# Patient Record
Sex: Male | Born: 1955 | Hispanic: No | Marital: Married | Smoking: Former smoker
Health system: Southern US, Community
[De-identification: ages and names within clinical notes are randomized; demographics above are authoritative.]

## PROBLEM LIST (undated history)

## (undated) DIAGNOSIS — M47812 Spondylosis without myelopathy or radiculopathy, cervical region: Secondary | ICD-10-CM

## (undated) DIAGNOSIS — M479 Spondylosis, unspecified: Secondary | ICD-10-CM

## (undated) DIAGNOSIS — A048 Other specified bacterial intestinal infections: Secondary | ICD-10-CM

## (undated) DIAGNOSIS — F32A Depression, unspecified: Secondary | ICD-10-CM

## (undated) DIAGNOSIS — F329 Major depressive disorder, single episode, unspecified: Secondary | ICD-10-CM

## (undated) DIAGNOSIS — N401 Enlarged prostate with lower urinary tract symptoms: Secondary | ICD-10-CM

## (undated) DIAGNOSIS — H9190 Unspecified hearing loss, unspecified ear: Secondary | ICD-10-CM

## (undated) DIAGNOSIS — G4700 Insomnia, unspecified: Secondary | ICD-10-CM

## (undated) DIAGNOSIS — R0602 Shortness of breath: Secondary | ICD-10-CM

## (undated) DIAGNOSIS — E7800 Pure hypercholesterolemia, unspecified: Principal | ICD-10-CM

## (undated) DIAGNOSIS — K219 Gastro-esophageal reflux disease without esophagitis: Secondary | ICD-10-CM

## (undated) DIAGNOSIS — F411 Generalized anxiety disorder: Secondary | ICD-10-CM

## (undated) HISTORY — DX: Depression, unspecified: F32.A

## (undated) HISTORY — PX: POLYPECTOMY: SHX149

## (undated) HISTORY — DX: Shortness of breath: R06.02

## (undated) HISTORY — DX: Spondylosis without myelopathy or radiculopathy, cervical region: M47.812

## (undated) HISTORY — DX: Generalized anxiety disorder: F41.1

## (undated) HISTORY — DX: Other specified bacterial intestinal infections: A04.8

## (undated) HISTORY — DX: Insomnia, unspecified: G47.00

## (undated) HISTORY — DX: Pure hypercholesterolemia, unspecified: E78.00

## (undated) HISTORY — DX: Gastro-esophageal reflux disease without esophagitis: K21.9

## (undated) HISTORY — DX: Major depressive disorder, single episode, unspecified: F32.9

## (undated) HISTORY — DX: Spondylosis, unspecified: M47.9

## (undated) HISTORY — DX: Unspecified hearing loss, unspecified ear: H91.90

## (undated) HISTORY — DX: Benign prostatic hyperplasia with lower urinary tract symptoms: N40.1

## (undated) HISTORY — PX: COLONOSCOPY: SHX174

---

## 1996-03-11 HISTORY — PX: TYMPANOPLASTY: SHX33

## 1997-07-09 DIAGNOSIS — A048 Other specified bacterial intestinal infections: Secondary | ICD-10-CM

## 1997-07-09 HISTORY — DX: Other specified bacterial intestinal infections: A04.8

## 1997-08-07 ENCOUNTER — Other Ambulatory Visit: Admission: RE | Admit: 1997-08-07 | Discharge: 1997-08-07 | Payer: Self-pay | Admitting: Urology

## 2000-03-17 ENCOUNTER — Encounter: Payer: Self-pay | Admitting: Endocrinology

## 2000-03-17 ENCOUNTER — Ambulatory Visit (HOSPITAL_COMMUNITY): Admission: RE | Admit: 2000-03-17 | Discharge: 2000-03-17 | Payer: Self-pay | Admitting: Endocrinology

## 2000-05-06 ENCOUNTER — Encounter
Admission: RE | Admit: 2000-05-06 | Discharge: 2000-06-01 | Payer: Self-pay | Admitting: Physical Medicine and Rehabilitation

## 2001-07-26 ENCOUNTER — Encounter: Admission: RE | Admit: 2001-07-26 | Discharge: 2001-07-26 | Payer: Self-pay | Admitting: Family Medicine

## 2001-07-26 ENCOUNTER — Encounter: Payer: Self-pay | Admitting: Family Medicine

## 2001-08-04 ENCOUNTER — Encounter: Payer: Self-pay | Admitting: Family Medicine

## 2001-08-04 ENCOUNTER — Encounter: Admission: RE | Admit: 2001-08-04 | Discharge: 2001-08-04 | Payer: Self-pay | Admitting: Family Medicine

## 2003-08-22 HISTORY — PX: OTHER SURGICAL HISTORY: SHX169

## 2004-10-23 ENCOUNTER — Ambulatory Visit: Payer: Self-pay | Admitting: Endocrinology

## 2004-11-14 ENCOUNTER — Ambulatory Visit: Payer: Self-pay | Admitting: Endocrinology

## 2005-06-17 ENCOUNTER — Ambulatory Visit: Payer: Self-pay | Admitting: Endocrinology

## 2006-05-21 ENCOUNTER — Ambulatory Visit: Payer: Self-pay | Admitting: Endocrinology

## 2006-05-21 LAB — CONVERTED CEMR LAB
ALT: 45 units/L — ABNORMAL HIGH (ref 0–40)
AST: 29 units/L (ref 0–37)
Albumin: 4.2 g/dL (ref 3.5–5.2)
Alkaline Phosphatase: 54 units/L (ref 39–117)
BUN: 8 mg/dL (ref 6–23)
Basophils Absolute: 0 10*3/uL (ref 0.0–0.1)
Basophils Relative: 0.4 % (ref 0.0–1.0)
Bilirubin Urine: NEGATIVE
CO2: 30 meq/L (ref 19–32)
Calcium: 9.6 mg/dL (ref 8.4–10.5)
Chloride: 108 meq/L (ref 96–112)
Chol/HDL Ratio, serum: 3
Cholesterol: 125 mg/dL (ref 0–200)
Creatinine, Ser: 0.8 mg/dL (ref 0.4–1.5)
Eosinophil percent: 4.7 % (ref 0.0–5.0)
GFR calc non Af Amer: 109 mL/min
Glomerular Filtration Rate, Af Am: 132 mL/min/{1.73_m2}
Glucose, Bld: 108 mg/dL — ABNORMAL HIGH (ref 70–99)
HCT: 46.5 % (ref 39.0–52.0)
HDL: 41.4 mg/dL (ref >39.0)
Hemoglobin, Urine: NEGATIVE
Hemoglobin: 15.4 g/dL (ref 13.0–17.0)
Ketones, ur: NEGATIVE mg/dL
LDL Cholesterol: 52 mg/dL (ref 0–99)
Leukocytes, UA: NEGATIVE
Lymphocytes Relative: 36.2 % (ref 12.0–46.0)
MCHC: 33.2 g/dL (ref 30.0–36.0)
MCV: 89.7 fL (ref 78.0–100.0)
Monocytes Absolute: 0.5 10*3/uL (ref 0.2–0.7)
Monocytes Relative: 7.9 % (ref 3.0–11.0)
Neutro Abs: 3.6 10*3/uL (ref 1.4–7.7)
Neutrophils Relative %: 50.8 % (ref 43.0–77.0)
Nitrite: NEGATIVE
PSA: 1.9 ng/mL (ref 0.10–4.00)
Platelets: 221 10*3/uL (ref 150–400)
Potassium: 4 meq/L (ref 3.5–5.1)
RBC: 5.19 M/uL (ref 4.22–5.81)
RDW: 11.2 % — ABNORMAL LOW (ref 11.5–14.6)
Sodium: 143 meq/L (ref 135–145)
Specific Gravity, Urine: 1.025 (ref 1.000–1.03)
TSH: 0.53 microintl units/mL (ref 0.35–5.50)
Total Bilirubin: 1.2 mg/dL (ref 0.3–1.2)
Total Protein, Urine: NEGATIVE mg/dL
Total Protein: 6.9 g/dL (ref 6.0–8.3)
Triglyceride fasting, serum: 160 mg/dL — ABNORMAL HIGH (ref 0–149)
Urine Glucose: NEGATIVE mg/dL
Urobilinogen, UA: 0.2 (ref 0.0–1.0)
VLDL: 32 mg/dL (ref 0–40)
WBC: 6.9 10*3/uL (ref 4.5–10.5)
pH: 6 (ref 5.0–8.0)

## 2006-05-28 ENCOUNTER — Ambulatory Visit: Payer: Self-pay | Admitting: Endocrinology

## 2006-05-28 HISTORY — PX: ELECTROCARDIOGRAM: SHX264

## 2006-05-28 LAB — CONVERTED CEMR LAB
ALT: 42 units/L — ABNORMAL HIGH (ref 0–40)
AST: 28 units/L (ref 0–37)
Albumin: 4.4 g/dL (ref 3.5–5.2)
Alkaline Phosphatase: 59 units/L (ref 39–117)
BUN: 9 mg/dL (ref 6–23)
Basophils Absolute: 0.1 10*3/uL (ref 0.0–0.1)
Basophils Relative: 0.9 % (ref 0.0–1.0)
Bilirubin Urine: NEGATIVE
CO2: 28 meq/L (ref 19–32)
Calcium: 9.6 mg/dL (ref 8.4–10.5)
Chloride: 107 meq/L (ref 96–112)
Cholesterol: 132 mg/dL (ref 0–200)
Creatinine, Ser: 0.9 mg/dL (ref 0.4–1.5)
Eosinophils Relative: 4.1 % (ref 0.0–5.0)
GFR calc Af Amer: 115 mL/min
GFR calc non Af Amer: 95 mL/min
Glucose, Bld: 116 mg/dL — ABNORMAL HIGH (ref 70–99)
HCT: 47.2 % (ref 39.0–52.0)
HDL: 45.8 mg/dL (ref >39.0)
Hemoglobin, Urine: NEGATIVE
Hemoglobin: 15.5 g/dL (ref 13.0–17.0)
Hep B S Ab: NEGATIVE
Hepatitis B Surface Ag: NEGATIVE
Ketones, ur: NEGATIVE mg/dL
LDL Cholesterol: 63 mg/dL (ref 0–99)
Leukocytes, UA: NEGATIVE
Lymphocytes Relative: 38.1 % (ref 12.0–46.0)
MCHC: 32.9 g/dL (ref 30.0–36.0)
MCV: 89.5 fL (ref 78.0–100.0)
Monocytes Absolute: 0.6 10*3/uL (ref 0.2–0.7)
Monocytes Relative: 8.2 % (ref 3.0–11.0)
Neutro Abs: 3.8 10*3/uL (ref 1.4–7.7)
Neutrophils Relative %: 48.7 % (ref 43.0–77.0)
Nitrite: NEGATIVE
Platelets: 213 10*3/uL (ref 150–400)
Potassium: 4.2 meq/L (ref 3.5–5.1)
RBC: 5.27 M/uL (ref 4.22–5.81)
RDW: 11.6 % (ref 11.5–14.6)
Sodium: 141 meq/L (ref 135–145)
Specific Gravity, Urine: 1.025 (ref 1.000–1.03)
TSH: 0.64 microintl units/mL (ref 0.35–5.50)
Total Bilirubin: 1.2 mg/dL (ref 0.3–1.2)
Total CHOL/HDL Ratio: 2.9
Total Protein, Urine: NEGATIVE mg/dL
Total Protein: 7.2 g/dL (ref 6.0–8.3)
Triglycerides: 118 mg/dL (ref 0–149)
Urine Glucose: NEGATIVE mg/dL
Urobilinogen, UA: 0.2 (ref 0.0–1.0)
VLDL: 24 mg/dL (ref 0–40)
WBC: 7.7 10*3/uL (ref 4.5–10.5)
pH: 6 (ref 5.0–8.0)

## 2007-01-03 ENCOUNTER — Encounter: Payer: Self-pay | Admitting: Endocrinology

## 2007-01-03 DIAGNOSIS — F411 Generalized anxiety disorder: Secondary | ICD-10-CM | POA: Insufficient documentation

## 2007-01-03 HISTORY — DX: Generalized anxiety disorder: F41.1

## 2007-01-12 ENCOUNTER — Ambulatory Visit: Payer: Self-pay | Admitting: Internal Medicine

## 2007-08-16 ENCOUNTER — Telehealth (INDEPENDENT_AMBULATORY_CARE_PROVIDER_SITE_OTHER): Payer: Self-pay | Admitting: *Deleted

## 2007-08-29 ENCOUNTER — Ambulatory Visit: Payer: Self-pay | Admitting: Endocrinology

## 2007-08-29 LAB — CONVERTED CEMR LAB
ALT: 41 units/L (ref 0–53)
AST: 28 units/L (ref 0–37)
Albumin: 4.3 g/dL (ref 3.5–5.2)
Alkaline Phosphatase: 53 units/L (ref 39–117)
BUN: 11 mg/dL (ref 6–23)
Basophils Absolute: 0 10*3/uL (ref 0.0–0.1)
Basophils Relative: 0.1 % (ref 0.0–1.0)
Bilirubin Urine: NEGATIVE
Bilirubin, Direct: 0.2 mg/dL (ref 0.0–0.3)
CO2: 28 meq/L (ref 19–32)
Calcium: 9.3 mg/dL (ref 8.4–10.5)
Chloride: 110 meq/L (ref 96–112)
Cholesterol: 121 mg/dL (ref 0–200)
Creatinine, Ser: 0.8 mg/dL (ref 0.4–1.5)
Eosinophils Absolute: 0.2 10*3/uL (ref 0.0–0.7)
Eosinophils Relative: 2.5 % (ref 0.0–5.0)
GFR calc Af Amer: 132 mL/min
GFR calc non Af Amer: 109 mL/min
Glucose, Bld: 106 mg/dL — ABNORMAL HIGH (ref 70–99)
HCT: 43.7 % (ref 39.0–52.0)
HDL: 39.9 mg/dL (ref >39.0)
Hemoglobin, Urine: NEGATIVE
Hemoglobin: 15 g/dL (ref 13.0–17.0)
Ketones, ur: NEGATIVE mg/dL
LDL Cholesterol: 60 mg/dL (ref 0–99)
Leukocytes, UA: NEGATIVE
Lymphocytes Relative: 29.1 % (ref 12.0–46.0)
MCHC: 34.3 g/dL (ref 30.0–36.0)
MCV: 88.1 fL (ref 78.0–100.0)
Monocytes Absolute: 0.5 10*3/uL (ref 0.1–1.0)
Monocytes Relative: 6.3 % (ref 3.0–12.0)
Neutro Abs: 4.5 10*3/uL (ref 1.4–7.7)
Neutrophils Relative %: 62 % (ref 43.0–77.0)
Nitrite: NEGATIVE
Platelets: 220 10*3/uL (ref 150–400)
Potassium: 4.2 meq/L (ref 3.5–5.1)
RBC: 4.96 M/uL (ref 4.22–5.81)
RDW: 11.3 % — ABNORMAL LOW (ref 11.5–14.6)
Sodium: 142 meq/L (ref 135–145)
Specific Gravity, Urine: 1.015 (ref 1.000–1.03)
TSH: 0.86 microintl units/mL (ref 0.35–5.50)
Total Bilirubin: 1.3 mg/dL — ABNORMAL HIGH (ref 0.3–1.2)
Total CHOL/HDL Ratio: 3
Total Protein, Urine: NEGATIVE mg/dL
Total Protein: 7 g/dL (ref 6.0–8.3)
Triglycerides: 107 mg/dL (ref 0–149)
Urine Glucose: NEGATIVE mg/dL
Urobilinogen, UA: 0.2 (ref 0.0–1.0)
VLDL: 21 mg/dL (ref 0–40)
WBC: 7.4 10*3/uL (ref 4.5–10.5)
pH: 6 (ref 5.0–8.0)

## 2007-09-01 ENCOUNTER — Ambulatory Visit: Payer: Self-pay | Admitting: Endocrinology

## 2007-09-01 DIAGNOSIS — E7800 Pure hypercholesterolemia, unspecified: Secondary | ICD-10-CM

## 2007-09-01 DIAGNOSIS — R739 Hyperglycemia, unspecified: Secondary | ICD-10-CM

## 2007-09-01 DIAGNOSIS — R0602 Shortness of breath: Secondary | ICD-10-CM

## 2007-09-01 HISTORY — DX: Pure hypercholesterolemia, unspecified: E78.00

## 2007-09-01 HISTORY — DX: Shortness of breath: R06.02

## 2007-09-01 HISTORY — DX: Hyperglycemia, unspecified: R73.9

## 2007-09-01 IMAGING — CR DG CHEST 2V
3 series · 3 of 3 positions shown · non-contrast
Comparison: [DATE]

CLINICAL DATA: Dyspnea

CHEST - 2 VIEW

[view not recorded (1 of 3)]
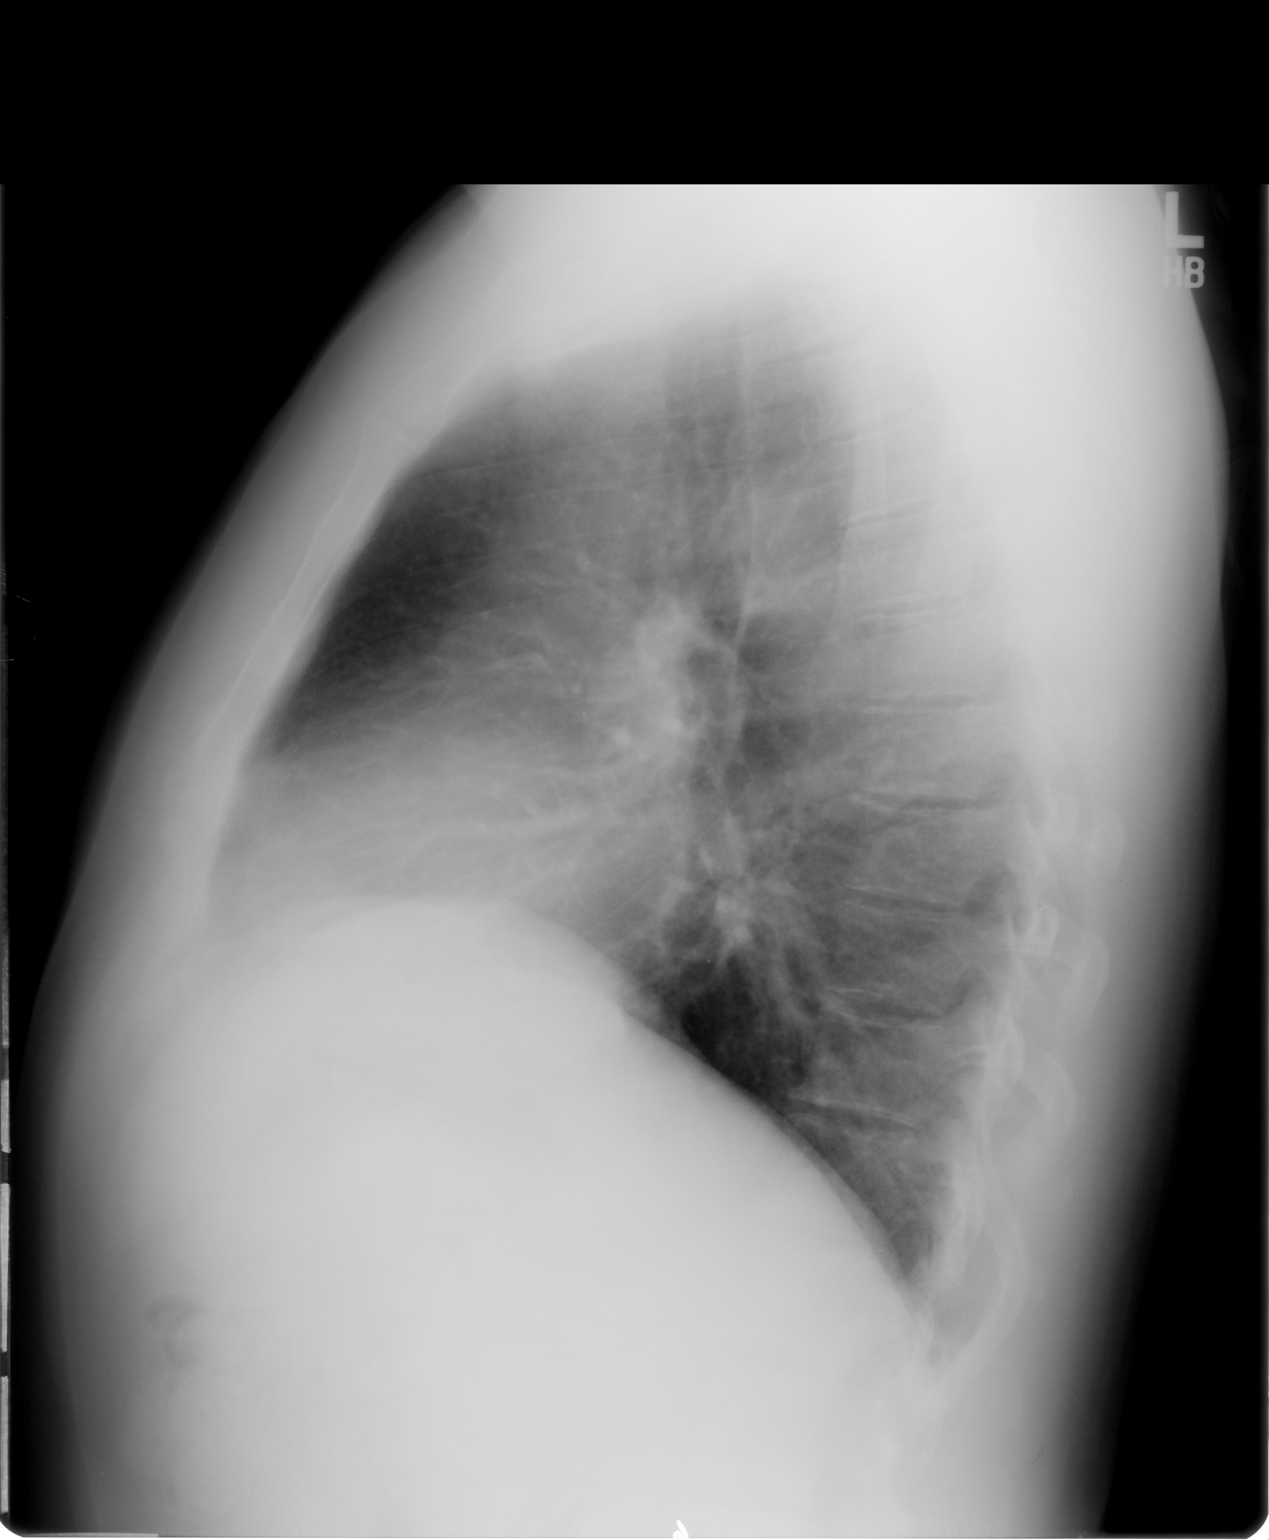

[view not recorded (2 of 3)]
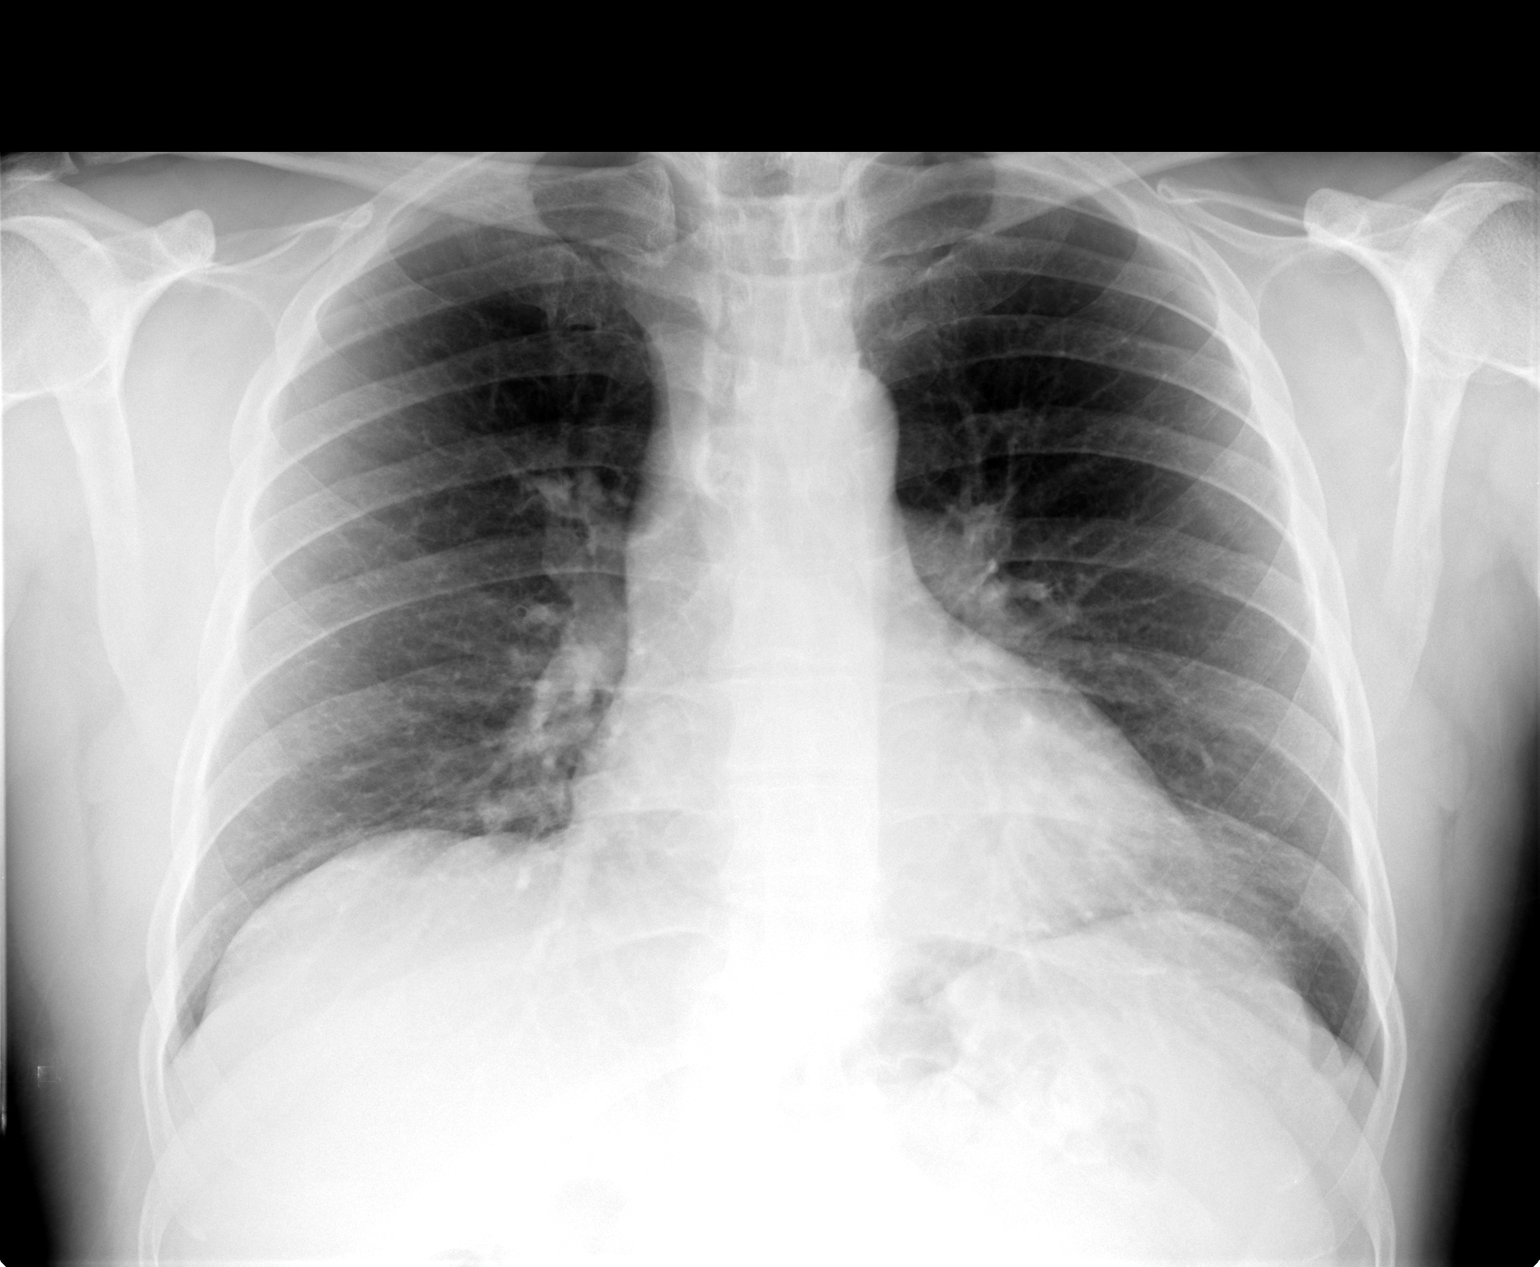

[view not recorded (3 of 3)]
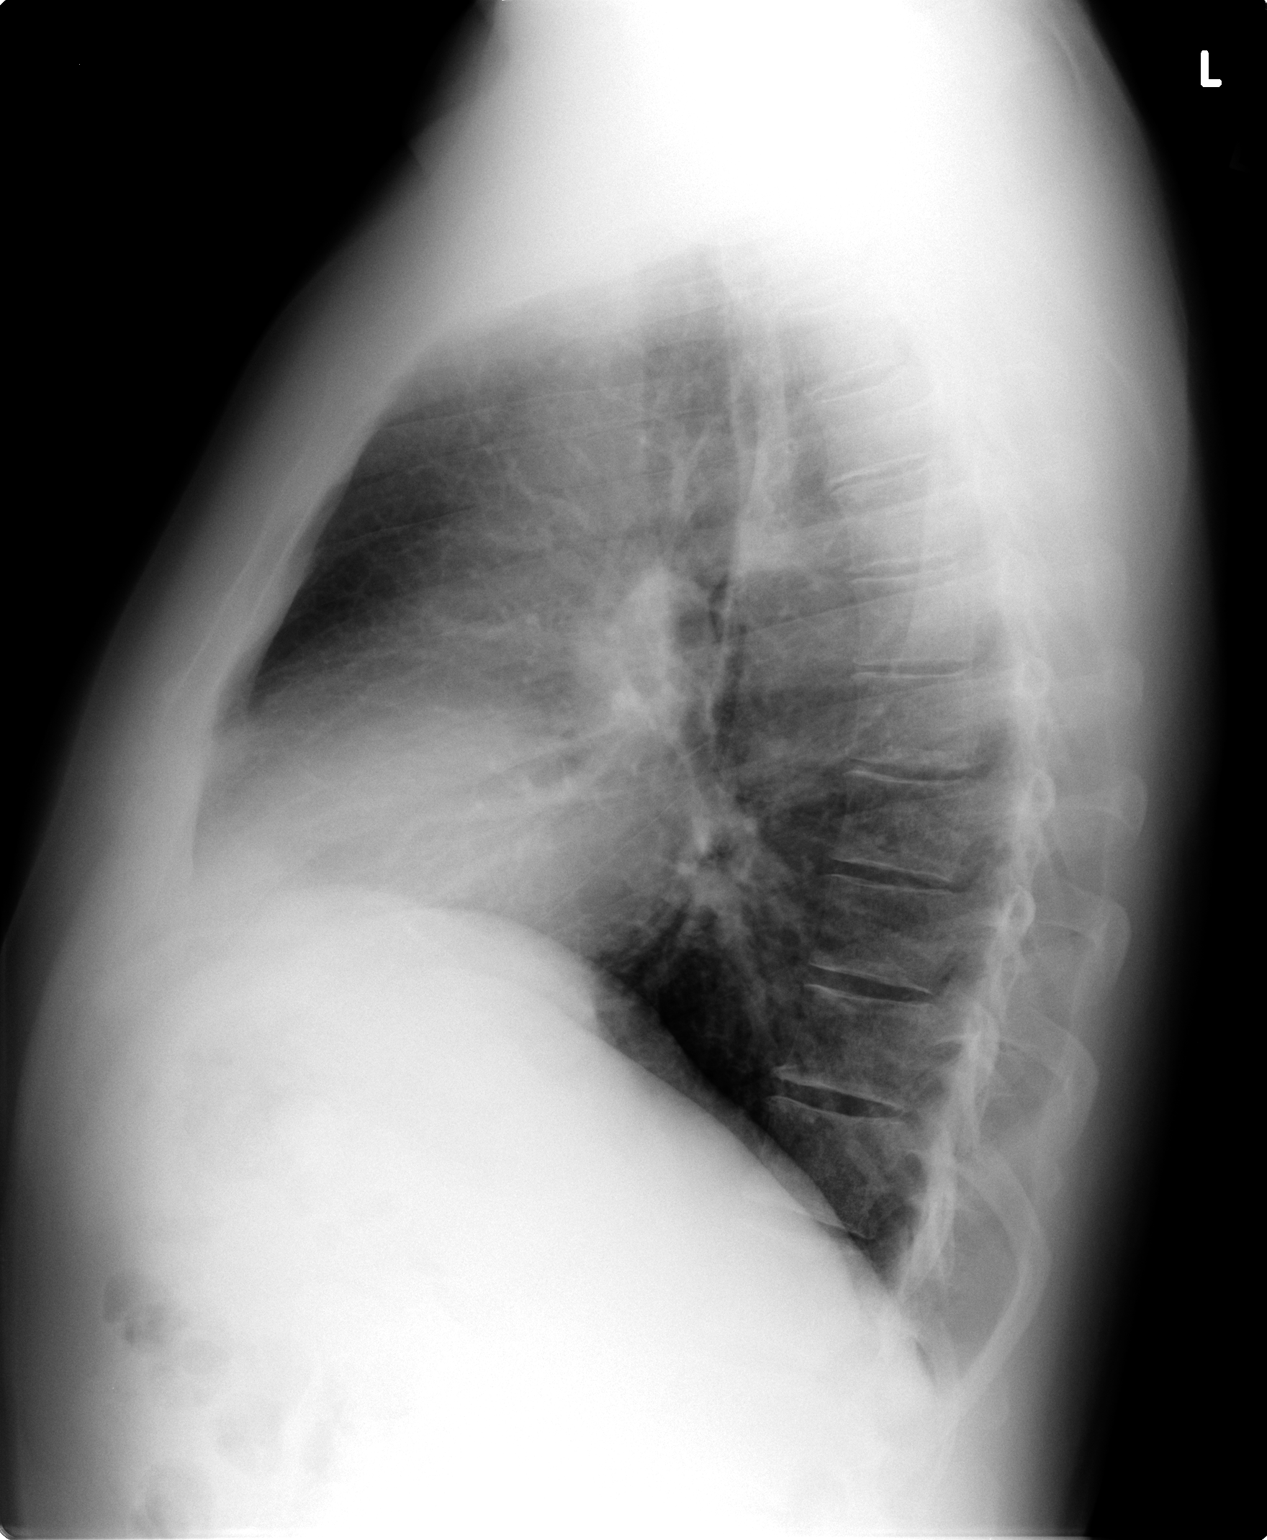

[3 of 3 positions shown; findings below may reference images not displayed]

FINDINGS: The heart is normal in size allowing for low lung
volumes.  Mild bronchitic changes are present.  The lungs are under
aerated and clear.  No pneumothoraces or effusions are seen.
IMPRESSION: Stable bronchitic changes.

## 2008-06-19 ENCOUNTER — Ambulatory Visit: Payer: Self-pay | Admitting: Endocrinology

## 2008-06-19 DIAGNOSIS — K219 Gastro-esophageal reflux disease without esophagitis: Secondary | ICD-10-CM

## 2008-06-19 DIAGNOSIS — G4700 Insomnia, unspecified: Secondary | ICD-10-CM

## 2008-06-19 DIAGNOSIS — N401 Enlarged prostate with lower urinary tract symptoms: Secondary | ICD-10-CM

## 2008-06-19 DIAGNOSIS — M479 Spondylosis, unspecified: Secondary | ICD-10-CM

## 2008-06-19 DIAGNOSIS — H9190 Unspecified hearing loss, unspecified ear: Secondary | ICD-10-CM

## 2008-06-19 HISTORY — DX: Spondylosis, unspecified: M47.9

## 2008-06-19 HISTORY — DX: Gastro-esophageal reflux disease without esophagitis: K21.9

## 2008-06-19 HISTORY — DX: Benign prostatic hyperplasia with lower urinary tract symptoms: N40.1

## 2008-06-19 HISTORY — DX: Unspecified hearing loss, unspecified ear: H91.90

## 2008-06-19 HISTORY — DX: Insomnia, unspecified: G47.00

## 2008-06-25 ENCOUNTER — Encounter: Payer: Self-pay | Admitting: Internal Medicine

## 2008-06-25 ENCOUNTER — Encounter: Payer: Self-pay | Admitting: Endocrinology

## 2008-06-25 ENCOUNTER — Ambulatory Visit: Payer: Self-pay | Admitting: Internal Medicine

## 2008-09-21 ENCOUNTER — Telehealth: Payer: Self-pay | Admitting: Endocrinology

## 2009-02-22 ENCOUNTER — Ambulatory Visit: Payer: Self-pay | Admitting: Gastroenterology

## 2009-02-25 ENCOUNTER — Ambulatory Visit: Payer: Self-pay | Admitting: Endocrinology

## 2009-02-25 LAB — CONVERTED CEMR LAB
Alkaline Phosphatase: 56 units/L (ref 39–117)
Basophils Absolute: 0 10*3/uL (ref 0.0–0.1)
Bilirubin Urine: NEGATIVE
Bilirubin, Direct: 0.1 mg/dL (ref 0.0–0.3)
Cholesterol: 129 mg/dL (ref 0–200)
Eosinophils Absolute: 0.2 10*3/uL (ref 0.0–0.7)
GFR calc non Af Amer: 107.83 mL/min (ref >60)
HCT: 45.6 % (ref 39.0–52.0)
HDL: 39.8 mg/dL (ref >39.00)
Hemoglobin: 15.4 g/dL (ref 13.0–17.0)
Ketones, ur: NEGATIVE mg/dL
LDL Cholesterol: 64 mg/dL (ref 0–99)
Leukocytes, UA: NEGATIVE
Lymphocytes Relative: 33 % (ref 12.0–46.0)
Monocytes Absolute: 0.5 10*3/uL (ref 0.1–1.0)
Neutrophils Relative %: 56.4 % (ref 43.0–77.0)
Platelets: 201 10*3/uL (ref 150.0–400.0)
Potassium: 4.3 meq/L (ref 3.5–5.1)
RDW: 11.5 % (ref 11.5–14.6)
Sodium: 142 meq/L (ref 135–145)
Specific Gravity, Urine: 1.015 (ref 1.000–1.030)
Total Bilirubin: 1 mg/dL (ref 0.3–1.2)
Total CHOL/HDL Ratio: 3
Total Protein: 7.8 g/dL (ref 6.0–8.3)
Triglycerides: 124 mg/dL (ref 0.0–149.0)
Urine Glucose: NEGATIVE mg/dL
Urobilinogen, UA: 0.2 (ref 0.0–1.0)
WBC: 6.5 10*3/uL (ref 4.5–10.5)

## 2009-02-27 ENCOUNTER — Ambulatory Visit: Payer: Self-pay | Admitting: Endocrinology

## 2009-03-15 ENCOUNTER — Encounter: Payer: Self-pay | Admitting: Endocrinology

## 2009-03-15 ENCOUNTER — Telehealth: Payer: Self-pay | Admitting: Endocrinology

## 2009-03-15 ENCOUNTER — Ambulatory Visit: Payer: Self-pay | Admitting: Gastroenterology

## 2009-10-02 ENCOUNTER — Telehealth: Payer: Self-pay | Admitting: Endocrinology

## 2009-10-09 ENCOUNTER — Telehealth: Payer: Self-pay | Admitting: Internal Medicine

## 2009-10-09 ENCOUNTER — Encounter: Payer: Self-pay | Admitting: Endocrinology

## 2009-10-30 ENCOUNTER — Ambulatory Visit: Payer: Self-pay | Admitting: Endocrinology

## 2009-10-30 DIAGNOSIS — M2550 Pain in unspecified joint: Secondary | ICD-10-CM

## 2009-10-30 DIAGNOSIS — M79609 Pain in unspecified limb: Secondary | ICD-10-CM

## 2009-10-30 HISTORY — DX: Pain in unspecified limb: M79.609

## 2009-10-30 HISTORY — DX: Pain in unspecified joint: M25.50

## 2009-10-30 LAB — CONVERTED CEMR LAB: Sed Rate: 13 mm/hr (ref 0–22)

## 2009-11-01 LAB — CONVERTED CEMR LAB
ANA Titer 1: NEGATIVE
Rhuematoid fact SerPl-aCnc: <20 intl units/mL (ref 0–20)

## 2009-11-06 ENCOUNTER — Encounter: Payer: Self-pay | Admitting: Endocrinology

## 2010-05-28 ENCOUNTER — Telehealth: Payer: Self-pay | Admitting: Endocrinology

## 2010-06-10 NOTE — Medication Information (Signed)
Summary: Zolpidem/[facility]  Zolpidem/[facility]   Imported By: Phillis Knack 10/14/2009 11:02:55  _____________________________________________________________________  External Attachment:    Type:   Image     Comment:   External Document

## 2010-06-10 NOTE — Progress Notes (Signed)
Summary: PA--Zolpidem  Phone Note From Pharmacy   Summary of Call: PA request--Zolpidem. Was available online, completed, and approved until 2012. Initial call taken by: Ernestene Mention,  October 09, 2009 8:46 AM  Follow-up for Phone Call        noted Follow-up by: Biagio Borg MD,  October 10, 2009 6:37 PM

## 2010-06-10 NOTE — Medication Information (Signed)
Summary: Zolpidem Approved/[facility]  Zolpidem Approved/[facility]   Imported By: Phillis Knack 10/31/2009 15:09:04  _____________________________________________________________________  External Attachment:    Type:   Image     Comment:   External Document

## 2010-06-10 NOTE — Assessment & Plan Note (Signed)
Summary: left ankle pain/cd   Vital Signs:  Patient profile:   55 year old male Height:      70 inches (177.80 cm) Weight:      198 pounds (90.00 kg) BMI:     28.51 O2 Sat:      99 % on Room air Temp:     98.1 degrees F (36.72 degrees C) oral Pulse rate:   96 / minute Pulse rhythm:   regular BP sitting:   104 / 74  (left arm) Cuff size:   regular  Vitals Entered By: Rebeca Alert MA (October 30, 2009 1:49 PM)  O2 Flow:  Room air CC: pt c/o left ankle pain with swelling/pt states pain is worse at night/aj   CC:  pt c/o left ankle pain with swelling/pt states pain is worse at night/aj.  History of Present Illness: pt states few weeks of intermittent moderate pain at the left foot and ankle.  no assoc numbness.  no injury.    Current Medications (verified): 1)  Lipitor 80 Mg  Tabs (Atorvastatin Calcium) .... Take 1 By Mouth Qd 2)  Flomax 0.4 Mg  Cp24 (Tamsulosin Hcl) .... Take 1 By Mouth Bid 3)  Zoloft 100 Mg  Tabs (Sertraline Hcl) .... Take 1 By Mouth Qd 4)  Avodart 0.5 Mg  Caps (Dutasteride) .... Take 1 By Mouth Qd 5)  Prilosec Otc 20 Mg Tbec (Omeprazole Magnesium) .... Qd 6)  Alprazolam 0.25 Mg Tabs (Alprazolam) .Marland Kitchen.. 1 Three Times A Day Prn Anxiety 7)  Ambien Cr 12.5 Mg Cr-Tabs (Zolpidem Tartrate) .Marland Kitchen.. 1 At Bedtime As Needed For Sleep  Allergies (verified): 1)  ! Zocor  Past History:  Past Medical History: Last updated: 09/01/2007 Smoker (Quit 1985) Hearing Loss Dyslipidemia Positive H. 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 he also has arthralgias at the wrists and ankles.  Physical Exam  General:  normal appearance.   Pulses:  dorsalis pedis intact bilat.    Extremities:  left foot and ankle are normal except for slight swelling and ankle tenderness.   Neurologic:  sensation is intact to touch on the feet  Additional Exam:   Sed  Rate                  13 mm/hr    Impression & Recommendations:  Problem # 1:  FOOT PAIN (ZHY-865.7) new uncertain etiology  Problem # 2:  ANKLE PAIN (QIO-962.95) new uncertain etiology  Other Orders: T-Antinuclear Antib (ANA) ([id number]) T- * Misc. Laboratory test [phone]) TLB-Sedimentation Rate (ESR) (85652-ESR) T-Ankle Comp Left Min 3 Views (73610TC) T-Foot Left Min 3 Views ([phone]) Orthopedic Referral (Ortho) Est. Patient Level IV (47425)  Patient Instructions: 1)  blood tests and x rays are being ordered for you today.  please call [phone] to hear your test results. 2)  refer dr Nelva Bush.  you will be called with a day and time for an appointment 3)  here are some samples of limovo 500/20, 1 pill two times a day as needed for pain. 4)  (update: i left message on phone-tree:  rx as we discussed)

## 2010-06-10 NOTE — Letter (Signed)
Summary: [place]   Imported By: Bubba Hales 11/18/2009 09:15:11  _____________________________________________________________________  External Attachment:    Type:   Image     Comment:   External Document

## 2010-06-10 NOTE — Progress Notes (Signed)
Summary: Rx refill Req  Phone Note Call from Patient   Caller: Patient [phone] Summary of Call: pt called requesting Rx for Ambien generic be changed to Ambien CR generic. please advise Initial call taken by: Crissie Sickles, [place],  Oct 02, 2009 11:50 AM  Follow-up for Phone Call        i printed Follow-up by: Donavan Foil MD,  Oct 02, 2009 1:16 PM  Additional Follow-up for Phone Call Additional follow up Details #1::        Rx telephoned to pharmacy. Pt informed Additional Follow-up by: Crissie Sickles, [place],  Oct 02, 2009 2:39 PM    New/Updated Medications: AMBIEN CR 12.5 MG CR-TABS (ZOLPIDEM TARTRATE) 1 at bedtime as needed for sleep Prescriptions: AMBIEN CR 12.5 MG CR-TABS (ZOLPIDEM TARTRATE) 1 at bedtime as needed for sleep  #30 x 5   Entered and Authorized by:   Donavan Foil MD   Signed by:   Donavan Foil MD on 10/02/2009   Method used:   Print then Give to Patient   RxID:   [id number]

## 2010-06-12 NOTE — Progress Notes (Signed)
Summary: CPX due  Phone Note Outgoing Call Call back at [facility] Phone [phone]   Call placed by: Rebeca Alert CMA Deborra Medina),  May 28, 2010 11:58 AM Call placed to: Patient Details for Reason: CPX due Summary of Call: Per MD, pt is due for CPX. Left message for pt to callback office  Follow-up for Phone Call        pt unavailable at work #, unable to leave message Follow-up by: Rebeca Alert CMA Deborra Medina),  May 30, 2010 4:38 PM  Additional Follow-up for Phone Call Additional follow up Details #1::        left message for pt to callback office Additional Follow-up by: Rebeca Alert CMA Deborra Medina),  June 02, 2010 4:18 PM    Additional Follow-up for Phone Call Additional follow up Details #2::    pt informed, pt states he will callback to schedule appointment for CPX Follow-up by: Rebeca Alert CMA Deborra Medina),  June 04, 2010 11:48 AM

## 2010-06-19 ENCOUNTER — Other Ambulatory Visit: Payer: Self-pay | Admitting: Endocrinology

## 2010-06-19 ENCOUNTER — Other Ambulatory Visit: Payer: BC Managed Care – PPO

## 2010-06-19 ENCOUNTER — Encounter (INDEPENDENT_AMBULATORY_CARE_PROVIDER_SITE_OTHER): Payer: Self-pay | Admitting: *Deleted

## 2010-06-19 DIAGNOSIS — Z0000 Encounter for general adult medical examination without abnormal findings: Secondary | ICD-10-CM

## 2010-06-19 DIAGNOSIS — Z0389 Encounter for observation for other suspected diseases and conditions ruled out: Secondary | ICD-10-CM

## 2010-06-19 LAB — BASIC METABOLIC PANEL
BUN: 13 mg/dL (ref 6–23)
Chloride: 101 mEq/L (ref 96–112)
Glucose, Bld: 97 mg/dL (ref 70–99)
Potassium: 4.4 mEq/L (ref 3.5–5.1)

## 2010-06-19 LAB — URINALYSIS, ROUTINE W REFLEX MICROSCOPIC
Ketones, ur: NEGATIVE
Leukocytes, UA: NEGATIVE
Specific Gravity, Urine: 1.015 (ref 1.000–1.030)
Urobilinogen, UA: 0.2 (ref 0.0–1.0)

## 2010-06-19 LAB — HEPATIC FUNCTION PANEL
ALT: 38 U/L (ref 0–53)
Total Bilirubin: 1.1 mg/dL (ref 0.3–1.2)

## 2010-06-19 LAB — CBC WITH DIFFERENTIAL/PLATELET
Basophils Absolute: 0 10*3/uL (ref 0.0–0.1)
Eosinophils Absolute: 0.2 10*3/uL (ref 0.0–0.7)
HCT: 44 % (ref 39.0–52.0)
Lymphs Abs: 2.9 10*3/uL (ref 0.7–4.0)
MCHC: 34 g/dL (ref 30.0–36.0)
MCV: 89.1 fl (ref 78.0–100.0)
Monocytes Absolute: 0.4 10*3/uL (ref 0.1–1.0)
Platelets: 197 10*3/uL (ref 150.0–400.0)
RDW: 12.2 % (ref 11.5–14.6)

## 2010-06-19 LAB — LIPID PANEL
Cholesterol: 133 mg/dL (ref 0–200)
VLDL: 22.2 mg/dL (ref 0.0–40.0)

## 2010-06-19 LAB — TSH: TSH: 0.58 u[IU]/mL (ref 0.35–5.50)

## 2010-06-19 LAB — PSA: PSA: 0.41 ng/mL (ref 0.10–4.00)

## 2010-06-24 ENCOUNTER — Encounter: Payer: Self-pay | Admitting: Endocrinology

## 2010-06-24 ENCOUNTER — Encounter (INDEPENDENT_AMBULATORY_CARE_PROVIDER_SITE_OTHER): Payer: BC Managed Care – PPO | Admitting: Endocrinology

## 2010-06-24 DIAGNOSIS — Z0000 Encounter for general adult medical examination without abnormal findings: Secondary | ICD-10-CM

## 2010-07-02 NOTE — Assessment & Plan Note (Signed)
Summary: PER PT PER MD/[facility]   Vital Signs:  Patient profile:   55 year old male Height:      70 inches (177.80 cm) Weight:      184.50 pounds (83.86 kg) BMI:     26.57 O2 Sat:      97 % on Room air Temp:     98.7 degrees F (37.06 degrees C) oral Pulse rate:   73 / minute Pulse rhythm:   regular BP sitting:   108 / 72  (left arm) Cuff size:   regular  Vitals Entered By: Rebeca Alert CMA Deborra Medina) (June 24, 2010 2:14 PM)  O2 Flow:  Room air CC: CPX/aj Is Patient Diabetic? No   CC:  CPX/aj.  History of Present Illness: here for regular wellness examination.  He's feeling pretty well in general, and does not drink or smoke.  he has lost weight, due to his efforts.  Current Medications (verified): 1)  Lipitor 80 Mg  Tabs (Atorvastatin Calcium) .... Take 1 By Mouth Qd 2)  Flomax 0.4 Mg Caps (Tamsulosin Hcl) .... 2 Capsules By Mouth Once Daily 3)  Zoloft 100 Mg  Tabs (Sertraline Hcl) .... Take 1 By Mouth Qd 4)  Avodart 0.5 Mg  Caps (Dutasteride) .... Take 1 By Mouth Qd 5)  Prilosec Otc 20 Mg Tbec (Omeprazole Magnesium) .... Qd 6)  Alprazolam 0.25 Mg Tabs (Alprazolam) .Marland Kitchen.. 1 Three Times A Day Prn Anxiety 7)  Ambien Cr 12.5 Mg Cr-Tabs (Zolpidem Tartrate) .Marland Kitchen.. 1 At Bedtime As Needed For Sleep  Allergies (verified): 1)  ! Zocor  Past History:  Past Medical History: Last updated: 09/01/2007 Smoker (Quit July 27, 1983) Hearing Loss Dyslipidemia Positive H. 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 father deceased July 26, 2009, due to gastric cancer  Social History: Reviewed history from 09/01/2007 and no changes required. married college professor  Review of Systems  The patient denies fever, vision loss, decreased hearing, chest pain, syncope, prolonged cough, headaches, abdominal pain, melena,  hematochezia, severe indigestion/heartburn, hematuria, suspicious skin lesions, and depression.         he has slight doe  Physical Exam  General:  normal appearance.   Head:  head: no deformity eyes: no periorbital swelling, no proptosis external nose and ears are normal mouth: no lesion seen Neck:  Supple without thyroid enlargement or tenderness.  Lungs:  Clear to auscultation bilaterally. Normal respiratory effort.  Heart:  Regular rate and rhythm without murmurs or gallops noted. Normal S1,S2.   Abdomen:  abdomen is soft, nontender.  no hepatosplenomegaly.   not distended.  no hernia  Rectal:  sees urology  Prostate:  sees urology  Msk:  muscle bulk and strength are grossly normal.  no obvious joint swelling.  gait is normal and steady  Pulses:  dorsalis pedis intact bilat.  no carotid bruit  Extremities:  no deformity.  no ulcer on the feet.  feet are of normal color and temp.  no edema  Neurologic:  cn 2-12 grossly intact.   readily moves all 4's.   sensation is intact to touch on the feet Skin:  normal texture and temp.  no rash.  not diaphoretic  Cervical Nodes:  No significant adenopathy.  Psych:  Alert and cooperative; normal mood and affect; normal attention span and concentration.     Impression & Recommendations:  Problem # 1:  ROUTINE GENERAL MEDICAL EXAM@HEALTH   CARE FACL (ICD-V70.0)  Medications Added to Medication List This Visit: 1)  Flomax 0.4 Mg Caps (Tamsulosin hcl) .... 2 capsules by mouth once daily 2)  Alprazolam 0.5 Mg Tabs (Alprazolam) .Marland Kitchen.. 1 tab three times a day as needed for anxiety  Other Orders: Est. Patient 40-64 years [phone])  Patient Instructions: 1)  please consider these measures for your health:  minimize alcohol.  do not use tobacco products.  have a colonoscopy at least every 10 years from age 68.  keep firearms safely stored.  always use seat belts.  have working smoke alarms in your home.  see an eye doctor and dentist regularly.   never drive under the influence of alcohol or drugs (including prescription drugs).  those with fair skin should take precautions against the sun. 2)  please let me know what your wishes would be, if artificial life support measures should become necessary.  it is critically important to prevent falling down (keep floor areas well-lit, dry, and free of loose objects).   3)  Please schedule a follow-up appointment in 1 year. Prescriptions: ALPRAZOLAM 0.5 MG TABS (ALPRAZOLAM) 1 tab three times a day as needed for anxiety  #50 x 5   Entered and Authorized by:   Donavan Foil MD   Signed by:   Donavan Foil MD on 06/24/2010   Method used:   Print then Give to Patient   RxID:   [id number]    Orders Added: 1)  Est. Patient 40-64 years [[id number]]

## 2010-08-09 ENCOUNTER — Other Ambulatory Visit: Payer: Self-pay | Admitting: Endocrinology

## 2010-08-16 ENCOUNTER — Other Ambulatory Visit: Payer: Self-pay | Admitting: Endocrinology

## 2010-08-18 NOTE — Telephone Encounter (Signed)
MD is out of office. Is this ok to refill?

## 2010-08-18 NOTE — Telephone Encounter (Signed)
Faxed script back to [facility] [employer]...08/18/10@4 :23pm/LMB

## 2010-09-16 ENCOUNTER — Other Ambulatory Visit: Payer: Self-pay | Admitting: Internal Medicine

## 2010-09-16 ENCOUNTER — Other Ambulatory Visit: Payer: Self-pay | Admitting: Endocrinology

## 2010-09-17 NOTE — Telephone Encounter (Signed)
i printed 

## 2010-09-17 NOTE — Telephone Encounter (Signed)
Rx faxed to pharmacy  

## 2010-09-23 ENCOUNTER — Other Ambulatory Visit: Payer: Self-pay

## 2010-09-23 MED ORDER — DUTASTERIDE 0.5 MG PO CAPS: 0.5000 mg | ORAL_CAPSULE | Freq: Every day | ORAL | Status: DC

## 2010-09-23 NOTE — Telephone Encounter (Signed)
Pt advise via VM

## 2010-09-23 NOTE — Telephone Encounter (Signed)
Pt called stating that at CPX in Feb 2012 he was given samples of Jalyn to try in replacement of Avodart and Flomax. Pt says that he prefers to take medication separately and is requesting Rx for Avodart, okay to refill?

## 2010-09-23 NOTE — Telephone Encounter (Signed)
That is fine.  Please refill prn

## 2010-10-19 ENCOUNTER — Other Ambulatory Visit: Payer: Self-pay | Admitting: Endocrinology

## 2010-10-20 ENCOUNTER — Other Ambulatory Visit: Payer: Self-pay

## 2010-10-20 NOTE — Telephone Encounter (Signed)
Already done June 11

## 2010-10-20 NOTE — Telephone Encounter (Signed)
SAE pt-out of office-please advise

## 2010-10-20 NOTE — Telephone Encounter (Signed)
Rx faxed to [facility].

## 2010-12-10 ENCOUNTER — Other Ambulatory Visit: Payer: Self-pay | Admitting: Endocrinology

## 2011-02-27 ENCOUNTER — Other Ambulatory Visit: Payer: Self-pay | Admitting: Internal Medicine

## 2011-03-02 NOTE — Telephone Encounter (Signed)
Rx faxed to [facility]

## 2011-05-01 ENCOUNTER — Other Ambulatory Visit: Payer: Self-pay | Admitting: Endocrinology

## 2011-05-04 NOTE — Telephone Encounter (Signed)
Done hardcopy to [facility]  

## 2011-05-04 NOTE — Telephone Encounter (Signed)
Faxed prescription to pharmacy.

## 2011-06-21 ENCOUNTER — Other Ambulatory Visit: Payer: Self-pay | Admitting: Endocrinology

## 2011-06-29 ENCOUNTER — Other Ambulatory Visit: Payer: Self-pay | Admitting: Endocrinology

## 2011-06-30 NOTE — Telephone Encounter (Signed)
Rx faxed to [facility].

## 2011-07-30 ENCOUNTER — Other Ambulatory Visit: Payer: Self-pay | Admitting: Endocrinology

## 2011-08-20 ENCOUNTER — Other Ambulatory Visit: Payer: Self-pay | Admitting: Endocrinology

## 2011-09-20 ENCOUNTER — Other Ambulatory Visit: Payer: Self-pay | Admitting: Endocrinology

## 2011-10-14 ENCOUNTER — Other Ambulatory Visit: Payer: Self-pay | Admitting: Endocrinology

## 2011-10-17 ENCOUNTER — Other Ambulatory Visit: Payer: Self-pay | Admitting: Endocrinology

## 2011-10-25 ENCOUNTER — Other Ambulatory Visit: Payer: Self-pay | Admitting: Internal Medicine

## 2011-10-26 NOTE — Telephone Encounter (Signed)
Done hardcopy to [facility]  

## 2011-10-26 NOTE — Telephone Encounter (Signed)
Faxed hardcopy to pharmacy. 

## 2011-10-27 ENCOUNTER — Telehealth: Payer: Self-pay | Admitting: *Deleted

## 2011-10-27 DIAGNOSIS — Z0389 Encounter for observation for other suspected diseases and conditions ruled out: Secondary | ICD-10-CM

## 2011-10-27 DIAGNOSIS — Z0000 Encounter for general adult medical examination without abnormal findings: Secondary | ICD-10-CM

## 2011-10-27 NOTE — Telephone Encounter (Signed)
Message copied by Legrand Como on Tue Oct 27, 2011  2:27 PM ------      Message from: COUSIN, [place] T      Created: Tue Oct 20, 2011 12:32 PM      Regarding: PHY DATE   11/09/11       THANKS

## 2011-10-27 NOTE — Telephone Encounter (Signed)
Labs place into Epic for upcoming CPX appointment.

## 2011-11-03 ENCOUNTER — Other Ambulatory Visit (INDEPENDENT_AMBULATORY_CARE_PROVIDER_SITE_OTHER): Payer: BC Managed Care – PPO

## 2011-11-03 DIAGNOSIS — Z0000 Encounter for general adult medical examination without abnormal findings: Secondary | ICD-10-CM

## 2011-11-03 DIAGNOSIS — Z0389 Encounter for observation for other suspected diseases and conditions ruled out: Secondary | ICD-10-CM

## 2011-11-03 LAB — URINALYSIS, ROUTINE W REFLEX MICROSCOPIC
Nitrite: NEGATIVE
Total Protein, Urine: NEGATIVE
pH: 6 (ref 5.0–8.0)

## 2011-11-03 LAB — BASIC METABOLIC PANEL
BUN: 9 mg/dL (ref 6–23)
Chloride: 103 mEq/L (ref 96–112)
GFR: 116.79 mL/min (ref >60.00)
Glucose, Bld: 87 mg/dL (ref 70–99)
Potassium: 3.9 mEq/L (ref 3.5–5.1)

## 2011-11-03 LAB — HEPATIC FUNCTION PANEL: Total Bilirubin: 1.4 mg/dL — ABNORMAL HIGH (ref 0.3–1.2)

## 2011-11-03 LAB — CBC WITH DIFFERENTIAL/PLATELET
Basophils Absolute: 0 10*3/uL (ref 0.0–0.1)
HCT: 44.3 % (ref 39.0–52.0)
Lymphs Abs: 2.9 10*3/uL (ref 0.7–4.0)
MCV: 90 fl (ref 78.0–100.0)
Monocytes Absolute: 0.6 10*3/uL (ref 0.1–1.0)
Platelets: 173 10*3/uL (ref 150.0–400.0)
RDW: 12.1 % (ref 11.5–14.6)

## 2011-11-03 LAB — LIPID PANEL
Cholesterol: 121 mg/dL (ref 0–200)
LDL Cholesterol: 34 mg/dL (ref 0–99)

## 2011-11-03 LAB — PSA: PSA: 0.55 ng/mL (ref 0.10–4.00)

## 2011-11-06 ENCOUNTER — Telehealth: Payer: Self-pay

## 2011-11-06 NOTE — Telephone Encounter (Signed)
A user error has taken place: encounter opened in error, closed for administrative reasons.

## 2011-11-09 ENCOUNTER — Encounter: Payer: Self-pay | Admitting: Endocrinology

## 2011-11-09 ENCOUNTER — Ambulatory Visit (INDEPENDENT_AMBULATORY_CARE_PROVIDER_SITE_OTHER): Payer: BC Managed Care – PPO | Admitting: Endocrinology

## 2011-11-09 VITALS — BP 112/78 | HR 68 | Temp 97.2°F | Ht 70.0 in | Wt 191.0 lb

## 2011-11-09 DIAGNOSIS — E7800 Pure hypercholesterolemia, unspecified: Secondary | ICD-10-CM

## 2011-11-09 DIAGNOSIS — K769 Liver disease, unspecified: Secondary | ICD-10-CM

## 2011-11-09 DIAGNOSIS — M479 Spondylosis, unspecified: Secondary | ICD-10-CM

## 2011-11-09 MED ORDER — ATORVASTATIN CALCIUM 40 MG PO TABS: 40.0000 mg | ORAL_TABLET | Freq: Every day | ORAL | Status: DC

## 2011-11-09 MED ORDER — ZOLPIDEM TARTRATE ER 12.5 MG PO TBCR: 12.5000 mg | EXTENDED_RELEASE_TABLET | Freq: Every evening | ORAL | Status: DC | PRN

## 2011-11-09 NOTE — Progress Notes (Signed)
Subjective:    Patient ID: Luis Peterson, male    DOB: 1956-03-31, 56 y.o.   MRN: [id number]  HPI Pt reports mild doe.  i reviewed spirometry results from 2010.   Past Medical History  Diagnosis Date  . ANXIETY 01/03/2007    Qualifier: Diagnosis of  By: Loanne Drilling MD, Jacelyn Pi   . 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 Consuella Lose  . DYSPNEA 09/01/2007    Qualifier: Diagnosis of  By: Loanne Drilling MD, Jacelyn Pi   . GERD 06/19/2008    Qualifier: Diagnosis of  By: Loanne Drilling MD, Jacelyn Pi   . HEARING LOSS 06/19/2008    Qualifier: Diagnosis of  By: Loanne Drilling MD, Jacelyn Pi   . HYPERCHOLESTEROLEMIA 09/01/2007    Qualifier: Diagnosis of  By: Loanne Drilling MD, Jacelyn Pi   . 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 Unspecified disorder of liver 06/19/2008    Qualifier: Diagnosis of  By: Loanne Drilling MD, Jone Baseman, SPINE 06/19/2008    Qualifier: Diagnosis of  By: Loanne Drilling MD, Jacelyn Pi Positive H. pylori test 07/1997  . Degenerative joint disease of cervical spine     Past Surgical History  Procedure Date  . Tympanoplasty 03/1996    Right  . Stress cardiolite 08/22/2003  . Electrocardiogram 05/28/2006    History   Social History  . Marital Status: Married    Spouse Name: N/A    Number of Children: N/A  . Years of Education: N/A   Occupational History  . College Professor  [employer]   Social History Main Topics  . Smoking status: Former Research scientist (life sciences)  . Smokeless tobacco: Not on file  . Alcohol Use: Not on file  . Drug Use: Not on file  . Sexually Active: Not on file   Other Topics Concern  . Not on file   Social History Narrative  . No narrative on file    Current Outpatient Prescriptions on File Prior to Visit  Medication Sig Dispense Refill  . ALPRAZolam (XANAX) 0.5 MG tablet TAKE 1 TABLET BY MOUTH  THREE TIMES DAILY AS NEEDED FOR ANXIETY  50 tablet  0  . Fluticasone-Salmeterol (ADVAIR) 100-50 MCG/DOSE AEPB Inhale 1 puff into the lungs every 12 (twelve) hours.      Marland Kitchen omeprazole (PRILOSEC) 20 MG capsule TAKE ONE CAPSULE BY MOUTH DAILY  90 capsule  3  . sertraline (ZOLOFT) 100 MG tablet TAKE 1 TABLET BY MOUTH EVERY DAY  90 tablet  3  . zolpidem (AMBIEN CR) 12.5 MG CR tablet Take 1 tablet (12.5 mg total) by mouth at bedtime as needed for sleep.  30 tablet  5    Allergies  Allergen Reactions  . Simvastatin     REACTION: Rash    Family History  Problem Relation Age of Onset  . Cancer Father     Gastric cancer    BP 112/78  Pulse 68  Temp 97.2 F (36.2 C) (Oral)  Ht 5' 10"  (1.778 m)  Wt 191 lb (86.637 kg)  BMI 27.41 kg/m2  SpO2 97%  Review of Systems  Constitutional: Negative for fever.  HENT: Negative for hearing loss.   Eyes: Negative for visual disturbance.  Respiratory: Negative for cough.   Cardiovascular: Negative for chest pain.  Gastrointestinal: Negative for anal bleeding.  Genitourinary: Negative for hematuria and difficulty urinating.  Musculoskeletal: Positive for back pain.  Skin: Negative for rash.  Neurological: Negative for syncope and numbness.  Hematological: Does not bruise/bleed easily.  Psychiatric/Behavioral: Negative for dysphoric mood.      Objective:   Physical Exam VS: see vs page GEN: no distress HEAD: head: no deformity eyes: no periorbital swelling, no proptosis external nose and ears are normal mouth: no lesion seen NECK: supple, thyroid is not enlarged CHEST WALL: no deformity BREASTS:  No gynecomastia CV: reg rate and rhythm, no murmur ABD: abdomen is soft, nontender.  no hepatosplenomegaly.  not distended.  no hernia. RECTAL: normal external and internal exam.  heme neg. PROSTATE:  Normal size.  No nodule MUSCULOSKELETAL: muscle bulk and strength are grossly normal.  no obvious joint swelling.  gait is normal and  steady EXTEMITIES: no deformity.  no ulcer on the feet.  feet are of normal color and temp.  no edema PULSES: dorsalis pedis intact bilat.  no carotid bruit NEURO:  cn 2-12 grossly intact.   readily moves all 4's.  sensation is intact to touch on the feet. SKIN:  Normal texture and temperature.  No rash or suspicious lesion is visible.   NODES:  None palpable at the neck.   PSYCH: alert, oriented x3.  Does not appear anxious nor depressed.    Assessment & Plan:  Wellness visit today, with problems stable, except as noted.    SEPARATE EVALUATION FOLLOWS--EACH PROBLEM HERE IS NEW, NOT RESPONDING TO TREATMENT, OR POSES SIGNIFICANT RISK TO THE PATIENT'S HEALTH: HISTORY OF THE PRESENT ILLNESS: Pt reports few years of slight doe in the chest, in the context of playing [place] reviewed and up to date today REVIEW OF SYSTEMS: Denies weight change PHYSICAL EXAMINATION: VITAL SIGNS:  See vs page. GENERAL: no distress LUNGS:  Clear to auscultation. LAB/XRAY RESULTS: i reviewed spirometry results from 2010 Lab Results  Component Value Date   CHOL 121 11/03/2011   HDL 47.10 11/03/2011   LDLCALC 34 11/03/2011   TRIG 199.0* 11/03/2011   CHOLHDL 3 11/03/2011  IMPRESSION: Doe, uncertain etiology Dyslipidemia.  He probably would be ok with a lower dosage of lipitor. PLAN: See instruction page

## 2011-11-09 NOTE — Patient Instructions (Addendum)
here is a sample of "advair-100."  take 1 puff 2x a day.  rinse mouth after using.  Call if you want a prescription for this.   please consider these measures for your health:  minimize alcohol.  do not use tobacco products.  have a colonoscopy at least every 10 years from age 55.  keep firearms safely stored.  always use seat belts.  have working smoke alarms in your home.  see an eye doctor and dentist regularly.  never drive under the influence of alcohol or drugs (including prescription drugs).  those with fair skin should take precautions against the sun.  Decrease lipitor to 40 mg daily.

## 2011-11-10 ENCOUNTER — Telehealth: Payer: Self-pay | Admitting: *Deleted

## 2011-11-10 MED ORDER — DUTASTERIDE 0.5 MG PO CAPS: ORAL_CAPSULE | ORAL | Status: DC

## 2011-11-10 MED ORDER — SERTRALINE HCL 100 MG PO TABS: ORAL_TABLET | ORAL | Status: DC

## 2011-11-10 MED ORDER — TAMSULOSIN HCL 0.4 MG PO CAPS: ORAL_CAPSULE | ORAL | Status: DC

## 2011-11-10 MED ORDER — OMEPRAZOLE 20 MG PO CPDR: DELAYED_RELEASE_CAPSULE | ORAL | Status: DC

## 2011-11-10 NOTE — Telephone Encounter (Signed)
R'cd fax from pharmacy for [place] for [place] approved 10/20/2011-11/09/2012-Case ID#: [id number]

## 2012-01-13 ENCOUNTER — Telehealth: Payer: Self-pay | Admitting: *Deleted

## 2012-01-13 NOTE — Telephone Encounter (Signed)
R'cd PA from [employer] on Ambien ER. PA approved 01/13/2012-01/12/2013, [place] informed.

## 2012-02-24 ENCOUNTER — Other Ambulatory Visit: Payer: Self-pay | Admitting: Endocrinology

## 2012-02-25 NOTE — Telephone Encounter (Signed)
Pt last seen here 11/2011 ok to fill

## 2012-05-22 ENCOUNTER — Other Ambulatory Visit: Payer: Self-pay | Admitting: Endocrinology

## 2012-05-23 ENCOUNTER — Other Ambulatory Visit: Payer: Self-pay | Admitting: *Deleted

## 2012-05-23 ENCOUNTER — Telehealth: Payer: Self-pay | Admitting: *Deleted

## 2012-05-23 NOTE — Telephone Encounter (Signed)
Patient request refill on Zolpidem.

## 2012-05-23 NOTE — Telephone Encounter (Signed)
Rx for Zolpidem faxed to [facility].

## 2012-06-20 ENCOUNTER — Other Ambulatory Visit: Payer: Self-pay | Admitting: Endocrinology

## 2012-06-21 ENCOUNTER — Other Ambulatory Visit: Payer: Self-pay

## 2012-06-21 ENCOUNTER — Other Ambulatory Visit: Payer: Self-pay | Admitting: *Deleted

## 2012-06-21 NOTE — Telephone Encounter (Signed)
Request medication refill. Please advise.

## 2012-06-21 NOTE — Telephone Encounter (Signed)
Refill on Rx for zolpidem faxed to [facility].

## 2012-07-27 ENCOUNTER — Ambulatory Visit: Payer: BC Managed Care – PPO

## 2012-07-27 ENCOUNTER — Ambulatory Visit (INDEPENDENT_AMBULATORY_CARE_PROVIDER_SITE_OTHER): Payer: BC Managed Care – PPO | Admitting: Endocrinology

## 2012-07-27 ENCOUNTER — Other Ambulatory Visit: Payer: Self-pay | Admitting: Endocrinology

## 2012-07-27 ENCOUNTER — Ambulatory Visit (INDEPENDENT_AMBULATORY_CARE_PROVIDER_SITE_OTHER)
Admission: RE | Admit: 2012-07-27 | Discharge: 2012-07-27 | Disposition: A | Discharge status: Home / Self Care | Payer: BC Managed Care – PPO | Source: Ambulatory Visit | Attending: Endocrinology | Admitting: Endocrinology

## 2012-07-27 ENCOUNTER — Telehealth: Payer: Self-pay | Admitting: Internal Medicine

## 2012-07-27 ENCOUNTER — Encounter: Payer: Self-pay | Admitting: Endocrinology

## 2012-07-27 VITALS — BP 138/70 | HR 71 | Wt 199.0 lb

## 2012-07-27 DIAGNOSIS — R0602 Shortness of breath: Secondary | ICD-10-CM

## 2012-07-27 LAB — CBC WITH DIFFERENTIAL/PLATELET
Basophils Absolute: 0 10*3/uL (ref 0.0–0.1)
Eosinophils Absolute: 0.1 10*3/uL (ref 0.0–0.7)
HCT: 43.3 % (ref 39.0–52.0)
Lymphs Abs: 2.5 10*3/uL (ref 0.7–4.0)
MCV: 87.9 fl (ref 78.0–100.0)
Monocytes Absolute: 0.6 10*3/uL (ref 0.1–1.0)
Platelets: 194 10*3/uL (ref 150.0–400.0)
RDW: 12.6 % (ref 11.5–14.6)

## 2012-07-27 LAB — D-DIMER, QUANTITATIVE: D-Dimer, Quant: 0.27 ug/mL-FEU (ref 0.00–0.48)

## 2012-07-27 NOTE — Telephone Encounter (Signed)
Patient called the Answering service about his results. He was seen today with dyspnea , d-dimer was negative, chest x-ray normal, CBC showed no anemia. I talked to the patient, told him there is no urgent results, recommend to call Dr. Loanne Drilling in the morning. ER if severe chest pain or dyspnea tonight.

## 2012-07-27 NOTE — Patient Instructions (Addendum)
blood tests are being requested for you today.  We'll contact you with results. At 1 PM today, please go to the old office for x-ray (basement), cardiogram, and breathing test (1st floor). Let's also check a treadmill test.  you will receive a phone call, about a day and time for an appointment.

## 2012-07-27 NOTE — Progress Notes (Signed)
Subjective:    Patient ID: Luis Peterson, male    DOB: 21-May-1955, 58 y.o.   MRN: [id number]  HPI Pt states few mos of moderate dyspnea sensation in the chest, in the context of exertion.  No assoc chest pain.   Past Medical History  Diagnosis Date  . ANXIETY 01/03/2007    Qualifier: Diagnosis of  By: Loanne Drilling MD, Jacelyn Pi   . 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 Consuella Lose  . DYSPNEA 09/01/2007    Qualifier: Diagnosis of  By: Loanne Drilling MD, Jacelyn Pi   . GERD 06/19/2008    Qualifier: Diagnosis of  By: Loanne Drilling MD, Jacelyn Pi   . HEARING LOSS 06/19/2008    Qualifier: Diagnosis of  By: Loanne Drilling MD, Jacelyn Pi   . HYPERCHOLESTEROLEMIA 09/01/2007    Qualifier: Diagnosis of  By: Loanne Drilling MD, Jacelyn Pi   . 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 Unspecified disorder of liver 06/19/2008    Qualifier: Diagnosis of  By: Loanne Drilling MD, Jone Baseman, SPINE 06/19/2008    Qualifier: Diagnosis of  By: Loanne Drilling MD, Jacelyn Pi Positive H. pylori test 07/1997  . Degenerative joint disease of cervical spine     Past Surgical History  Procedure Laterality Date  . Tympanoplasty  03/1996    Right  . Stress cardiolite  08/22/2003  . Electrocardiogram  05/28/2006    History   Social History  . Marital Status: Married    Spouse Name: N/A    Number of Children: N/A  . Years of Education: N/A   Occupational History  . College Professor  [employer]   Social History Main Topics  . Smoking status: Former Research scientist (life sciences)  . Smokeless tobacco: Not on file  . Alcohol Use: Not on file  . Drug Use: Not on file  . Sexually Active: Not on file   Other Topics Concern  . Not on file   Social History Narrative  . No narrative on file    Current Outpatient Prescriptions on File Prior to Visit  Medication Sig Dispense  Refill  . atorvastatin (LIPITOR) 40 MG tablet Take 1 tablet (40 mg total) by mouth daily.  90 tablet  3  . atorvastatin (LIPITOR) 80 MG tablet TAKE 1 TABLET BY MOUTH ONCE DAILY  30 tablet  0  . dutasteride (AVODART) 0.5 MG capsule TAKE 1 CAPSULE BY MOUTH DAILY  90 capsule  3  . Fluticasone-Salmeterol (ADVAIR) 100-50 MCG/DOSE AEPB Inhale 1 puff into the lungs every 12 (twelve) hours.      Marland Kitchen omeprazole (PRILOSEC) 20 MG capsule TAKE ONE CAPSULE BY MOUTH DAILY  90 capsule  3  . sertraline (ZOLOFT) 100 MG tablet TAKE 1 TABLET BY MOUTH EVERY DAY  90 tablet  3  . Tamsulosin HCl (FLOMAX) 0.4 MG CAPS TAKE 1 CAPSULE BY MOUTH TWICE DAILY  60 capsule  11   No current facility-administered medications on file prior to visit.    Allergies  Allergen Reactions  . Simvastatin     REACTION: Rash    Family History  Problem Relation Age of Onset  . Cancer Father     Gastric cancer    BP 138/70  Pulse 71  Wt 199 lb (90.266 kg)  BMI 28.55 kg/m2  SpO2 97%  Review of Systems Denies cough, but he has unusual fatigue with exertion.  He also has palpitations with exertion.      Objective:   Physical Exam VITAL SIGNS:  See vs page GENERAL: no distress LUNGS:  Clear to auscultation    i reviewed electrocardiogram CXR: nad    Assessment & Plan:  Sob, new, prob due to anxiety

## 2012-07-28 MED ORDER — ALPRAZOLAM 0.5 MG PO TABS: 0.5000 mg | ORAL_TABLET | Freq: Three times a day (TID) | ORAL | Status: DC | PRN

## 2012-07-28 MED ORDER — ZOLPIDEM TARTRATE ER 12.5 MG PO TBCR: 12.5000 mg | EXTENDED_RELEASE_TABLET | Freq: Every evening | ORAL | Status: DC | PRN

## 2012-07-30 ENCOUNTER — Encounter: Payer: Self-pay | Admitting: Endocrinology

## 2012-08-01 ENCOUNTER — Other Ambulatory Visit: Payer: Self-pay | Admitting: Endocrinology

## 2012-08-01 DIAGNOSIS — E7800 Pure hypercholesterolemia, unspecified: Secondary | ICD-10-CM

## 2012-08-01 MED ORDER — FENOFIBRATE 54 MG PO TABS: 54.0000 mg | ORAL_TABLET | Freq: Every day | ORAL | Status: DC

## 2012-08-01 MED ORDER — ATORVASTATIN CALCIUM 10 MG PO TABS: 10.0000 mg | ORAL_TABLET | Freq: Every day | ORAL | Status: DC

## 2012-08-17 ENCOUNTER — Ambulatory Visit (INDEPENDENT_AMBULATORY_CARE_PROVIDER_SITE_OTHER): Payer: BC Managed Care – PPO | Admitting: Physician Assistant

## 2012-08-17 DIAGNOSIS — R0602 Shortness of breath: Secondary | ICD-10-CM

## 2012-08-17 NOTE — Progress Notes (Signed)
Luis Peterson is a 57 y.o. male referred by PCP for ETT. He has a hx of HL.  No hx of DM2, HTN, CAD.  Non-smoker.  + Fhx of CAD.  Has had recent hx of DOE.  No CP, syncope. Exam unremarkable.   Exercise Treadmill Test  Pre-Exercise Testing Evaluation Rhythm: normal sinus  Rate: 90                 Test  Exercise Tolerance Test Ordering MD: Renato Shin, MD  Interpreting MD: Richardson Dopp, PA-C  Unique Test No: 1  Treadmill:  1  Indication for ETT: exertional dyspnea  Contraindication to ETT: No   Stress Modality: exercise - treadmill  Cardiac Imaging Performed: non   Protocol: standard Bruce - maximal  Max BP:  163/90  Max MPHR (bpm):  165 85% MPR (bpm):  140  MPHR obtained (bpm):  171 % MPHR obtained:  103  Reached 85% MPHR (min:sec):  1:58 Total Exercise Time (min-sec):  6:00  Workload in METS:  7.0 Borg Scale: 15  Reason ETT Terminated:  desired heart rate attained    ST Segment Analysis At Rest: normal ST segments - no evidence of significant ST depression With Exercise: non-specific ST changes  Other Information Arrhythmia:  No Angina during ETT:  absent (0) Quality of ETT:  diagnostic  ETT Interpretation:  normal - no evidence of ischemia by ST analysis  Comments: Fair exercise tolerance. No chest pain. Normal BP response to exercise. No ST-T changes to suggest ischemia.   Recommendations: F/u with Renato Shin, MD as directed. Danton Sewer, PA-C  9:28 AM 08/17/2012

## 2012-11-11 ENCOUNTER — Other Ambulatory Visit: Payer: Self-pay | Admitting: Endocrinology

## 2012-11-14 ENCOUNTER — Other Ambulatory Visit: Payer: Self-pay | Admitting: Endocrinology

## 2012-11-14 ENCOUNTER — Other Ambulatory Visit: Payer: Self-pay | Admitting: *Deleted

## 2012-11-14 MED ORDER — SERTRALINE HCL 100 MG PO TABS: ORAL_TABLET | ORAL | Status: DC

## 2012-11-14 MED ORDER — DUTASTERIDE 0.5 MG PO CAPS: 0.5000 mg | ORAL_CAPSULE | Freq: Every day | ORAL | Status: DC

## 2012-11-14 NOTE — Telephone Encounter (Signed)
Rx did not go through. Resending

## 2012-11-23 ENCOUNTER — Other Ambulatory Visit: Payer: Self-pay | Admitting: Endocrinology

## 2012-11-24 ENCOUNTER — Other Ambulatory Visit: Payer: Self-pay | Admitting: *Deleted

## 2012-11-24 MED ORDER — TAMSULOSIN HCL 0.4 MG PO CAPS: ORAL_CAPSULE | ORAL | Status: DC

## 2013-01-21 ENCOUNTER — Other Ambulatory Visit: Payer: Self-pay | Admitting: Endocrinology

## 2013-01-27 ENCOUNTER — Other Ambulatory Visit: Payer: Self-pay | Admitting: Endocrinology

## 2013-02-05 ENCOUNTER — Other Ambulatory Visit: Payer: Self-pay | Admitting: Endocrinology

## 2013-02-06 ENCOUNTER — Other Ambulatory Visit: Payer: Self-pay | Admitting: *Deleted

## 2013-02-06 NOTE — Telephone Encounter (Signed)
Opened encounter in error  

## 2013-02-09 ENCOUNTER — Other Ambulatory Visit: Payer: Self-pay | Admitting: Endocrinology

## 2013-02-14 ENCOUNTER — Other Ambulatory Visit: Payer: Self-pay | Admitting: Endocrinology

## 2013-02-14 NOTE — Telephone Encounter (Signed)
Refill x 1 cpx is due

## 2013-02-22 ENCOUNTER — Other Ambulatory Visit: Payer: Self-pay

## 2013-02-22 ENCOUNTER — Other Ambulatory Visit: Payer: Self-pay | Admitting: Endocrinology

## 2013-03-04 ENCOUNTER — Other Ambulatory Visit: Payer: Self-pay | Admitting: Endocrinology

## 2013-03-06 ENCOUNTER — Other Ambulatory Visit: Payer: Self-pay | Admitting: *Deleted

## 2013-03-06 MED ORDER — OMEPRAZOLE 20 MG PO CPDR: DELAYED_RELEASE_CAPSULE | ORAL | Status: DC

## 2013-03-13 ENCOUNTER — Other Ambulatory Visit: Payer: Self-pay | Admitting: Endocrinology

## 2013-03-14 ENCOUNTER — Other Ambulatory Visit: Payer: Self-pay

## 2013-03-14 ENCOUNTER — Other Ambulatory Visit: Payer: Self-pay | Admitting: *Deleted

## 2013-03-14 MED ORDER — ZOLPIDEM TARTRATE ER 12.5 MG PO TBCR: 12.5000 mg | EXTENDED_RELEASE_TABLET | Freq: Every evening | ORAL | Status: DC | PRN

## 2013-03-14 NOTE — Telephone Encounter (Signed)
cpx is due

## 2013-03-14 NOTE — Telephone Encounter (Signed)
rx faxed to pharmacy

## 2013-03-16 ENCOUNTER — Other Ambulatory Visit: Payer: Self-pay

## 2013-04-10 ENCOUNTER — Telehealth: Payer: Self-pay

## 2013-04-10 MED ORDER — ZOLPIDEM TARTRATE ER 12.5 MG PO TBCR: 12.5000 mg | EXTENDED_RELEASE_TABLET | Freq: Every evening | ORAL | Status: DC | PRN

## 2013-04-10 NOTE — Telephone Encounter (Signed)
Pharmacy requesting Zolpidem 12.mg refill. Patient was last seen on 07/27/2012 and medication was last filled on 03/14/2013.

## 2013-04-10 NOTE — Telephone Encounter (Signed)
i printed 

## 2013-04-15 ENCOUNTER — Other Ambulatory Visit: Payer: Self-pay | Admitting: Endocrinology

## 2013-04-17 ENCOUNTER — Other Ambulatory Visit: Payer: Self-pay | Admitting: *Deleted

## 2013-04-17 MED ORDER — TAMSULOSIN HCL 0.4 MG PO CAPS: ORAL_CAPSULE | ORAL | Status: DC

## 2013-05-08 ENCOUNTER — Other Ambulatory Visit: Payer: Self-pay | Admitting: Endocrinology

## 2013-05-20 ENCOUNTER — Other Ambulatory Visit: Payer: Self-pay | Admitting: Endocrinology

## 2013-07-07 ENCOUNTER — Other Ambulatory Visit: Payer: Self-pay

## 2013-07-07 MED ORDER — ATORVASTATIN CALCIUM 10 MG PO TABS: 10.0000 mg | ORAL_TABLET | Freq: Every day | ORAL | Status: DC

## 2013-07-19 ENCOUNTER — Telehealth: Payer: Self-pay

## 2013-07-19 MED ORDER — ALPRAZOLAM 0.5 MG PO TABS: 0.5000 mg | ORAL_TABLET | Freq: Three times a day (TID) | ORAL | Status: DC | PRN

## 2013-07-19 NOTE — Telephone Encounter (Signed)
i printed 

## 2013-07-19 NOTE — Telephone Encounter (Signed)
Script faxed to pharmacy

## 2013-07-19 NOTE — Telephone Encounter (Signed)
Received fax from pharmacy requesting a refill on Alprazolam 0.5 mg. Pt was last seen on 07/27/2012 and medication was last filled on 02/22/2013.  Thanks!

## 2013-07-30 ENCOUNTER — Other Ambulatory Visit: Payer: Self-pay | Admitting: Endocrinology

## 2013-08-06 ENCOUNTER — Other Ambulatory Visit: Payer: Self-pay | Admitting: Endocrinology

## 2013-08-24 ENCOUNTER — Other Ambulatory Visit: Payer: Self-pay

## 2013-08-24 ENCOUNTER — Other Ambulatory Visit (INDEPENDENT_AMBULATORY_CARE_PROVIDER_SITE_OTHER): Payer: BC Managed Care – PPO

## 2013-08-24 DIAGNOSIS — Z0000 Encounter for general adult medical examination without abnormal findings: Secondary | ICD-10-CM

## 2013-08-24 LAB — BASIC METABOLIC PANEL
BUN: 16 mg/dL (ref 6–23)
CHLORIDE: 105 meq/L (ref 96–112)
CO2: 25 meq/L (ref 19–32)
CREATININE: 0.9 mg/dL (ref 0.4–1.5)
Calcium: 9.8 mg/dL (ref 8.4–10.5)
GFR: 97.55 mL/min (ref >60.00)
Glucose, Bld: 95 mg/dL (ref 70–99)
POTASSIUM: 4.2 meq/L (ref 3.5–5.1)
Sodium: 138 mEq/L (ref 135–145)

## 2013-08-24 LAB — LIPID PANEL
Cholesterol: 175 mg/dL (ref 0–200)
HDL: 51.4 mg/dL (ref >39.00)
LDL Cholesterol: 96 mg/dL (ref 0–99)
Total CHOL/HDL Ratio: 3
Triglycerides: 137 mg/dL (ref 0.0–149.0)
VLDL: 27.4 mg/dL (ref 0.0–40.0)

## 2013-08-24 LAB — URINALYSIS, ROUTINE W REFLEX MICROSCOPIC
Bilirubin Urine: NEGATIVE
Hgb urine dipstick: NEGATIVE
Ketones, ur: NEGATIVE
Leukocytes, UA: NEGATIVE
NITRITE: NEGATIVE
PH: 6 (ref 5.0–8.0)
RBC / HPF: NONE SEEN (ref 0–?)
SPECIFIC GRAVITY, URINE: 1.02 (ref 1.000–1.030)
Total Protein, Urine: NEGATIVE
URINE GLUCOSE: NEGATIVE
Urobilinogen, UA: 0.2 (ref 0.0–1.0)

## 2013-08-24 LAB — CBC WITH DIFFERENTIAL/PLATELET
BASOS PCT: 0.3 % (ref 0.0–3.0)
Basophils Absolute: 0 10*3/uL (ref 0.0–0.1)
EOS PCT: 2.6 % (ref 0.0–5.0)
Eosinophils Absolute: 0.2 10*3/uL (ref 0.0–0.7)
HCT: 44.6 % (ref 39.0–52.0)
HEMOGLOBIN: 14.9 g/dL (ref 13.0–17.0)
LYMPHS PCT: 34.5 % (ref 12.0–46.0)
Lymphs Abs: 2.8 10*3/uL (ref 0.7–4.0)
MCHC: 33.4 g/dL (ref 30.0–36.0)
MCV: 89.2 fl (ref 78.0–100.0)
MONOS PCT: 6.1 % (ref 3.0–12.0)
Monocytes Absolute: 0.5 10*3/uL (ref 0.1–1.0)
NEUTROS ABS: 4.6 10*3/uL (ref 1.4–7.7)
Neutrophils Relative %: 56.5 % (ref 43.0–77.0)
Platelets: 206 10*3/uL (ref 150.0–400.0)
RBC: 5 Mil/uL (ref 4.22–5.81)
RDW: 12 % (ref 11.5–14.6)
WBC: 8.1 10*3/uL (ref 4.5–10.5)

## 2013-08-24 LAB — TSH: TSH: 0.61 u[IU]/mL (ref 0.35–5.50)

## 2013-08-24 LAB — PSA: PSA: 0.44 ng/mL (ref 0.10–4.00)

## 2013-08-26 ENCOUNTER — Other Ambulatory Visit: Payer: Self-pay | Admitting: Endocrinology

## 2013-08-28 ENCOUNTER — Other Ambulatory Visit: Payer: Self-pay | Admitting: Endocrinology

## 2013-08-31 ENCOUNTER — Encounter: Payer: Self-pay | Admitting: Endocrinology

## 2013-08-31 ENCOUNTER — Ambulatory Visit (INDEPENDENT_AMBULATORY_CARE_PROVIDER_SITE_OTHER): Payer: BC Managed Care – PPO | Admitting: Endocrinology

## 2013-08-31 VITALS — BP 136/78 | HR 95 | Temp 98.5°F | Ht 70.0 in | Wt 197.0 lb

## 2013-08-31 DIAGNOSIS — M545 Low back pain, unspecified: Secondary | ICD-10-CM | POA: Insufficient documentation

## 2013-08-31 DIAGNOSIS — Z0000 Encounter for general adult medical examination without abnormal findings: Secondary | ICD-10-CM

## 2013-08-31 HISTORY — DX: Low back pain, unspecified: M54.50

## 2013-08-31 MED ORDER — ALPRAZOLAM 0.5 MG PO TABS: 0.5000 mg | ORAL_TABLET | Freq: Three times a day (TID) | ORAL | Status: DC | PRN

## 2013-08-31 MED ORDER — TRAMADOL-ACETAMINOPHEN 37.5-325 MG PO TABS: 1.0000 | ORAL_TABLET | ORAL | Status: DC | PRN

## 2013-08-31 NOTE — Progress Notes (Signed)
Subjective:    Patient ID: Luis Peterson, male    DOB: [birth year], 58 y.o.   MRN: [id number]  HPI Past Medical History  Diagnosis Date  . ANXIETY 01/03/2007    Qualifier: Diagnosis of  By: Loanne Drilling MD, Jacelyn Pi   . 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 Consuella Lose  . DYSPNEA 09/01/2007    Qualifier: Diagnosis of  By: Loanne Drilling MD, Jacelyn Pi   . GERD 06/19/2008    Qualifier: Diagnosis of  By: Loanne Drilling MD, Jacelyn Pi   . HEARING LOSS 06/19/2008    Qualifier: Diagnosis of  By: Loanne Drilling MD, Jacelyn Pi   . HYPERCHOLESTEROLEMIA 09/01/2007    Qualifier: Diagnosis of  By: Loanne Drilling MD, Jacelyn Pi   . 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 Unspecified disorder of liver 06/19/2008    Qualifier: Diagnosis of  By: Loanne Drilling MD, Jone Baseman, SPINE 06/19/2008    Qualifier: Diagnosis of  By: Loanne Drilling MD, Jacelyn Pi Positive H. pylori test 07/1997  . Degenerative joint disease of cervical spine     Past Surgical History  Procedure Laterality Date  . Tympanoplasty  03/1996    Right  . Stress cardiolite  08/22/2003  . Electrocardiogram  05/28/2006    History   Social History  . Marital Status: Married    Spouse Name: N/A    Number of Children: N/A  . Years of Education: N/A   Occupational History  . College Professor  [employer]   Social History Main Topics  . Smoking status: Former Research scientist (life sciences)  . Smokeless tobacco: Not on file  . Alcohol Use: Not on file  . Drug Use: Not on file  . Sexual Activity: Not on file   Other Topics Concern  . Not on file   Social History Narrative  . No narrative on file    Current Outpatient Prescriptions on File Prior to Visit  Medication Sig Dispense Refill  . atorvastatin (LIPITOR) 10 MG tablet Take 1 tablet (10 mg total) by mouth daily.  90 tablet  0  .  dutasteride (AVODART) 0.5 MG capsule Take 1 capsule (0.5 mg total) by mouth daily.  90 capsule  0  . fenofibrate 54 MG tablet TAKE 1 TABLET BY MOUTH DAILY  30 tablet  0  . Fluticasone-Salmeterol (ADVAIR) 100-50 MCG/DOSE AEPB Inhale 1 puff into the lungs every 12 (twelve) hours.      Marland Kitchen omeprazole (PRILOSEC) 20 MG capsule TAKE ONE CAPSULE BY MOUTH DAILY  90 capsule  3  . sertraline (ZOLOFT) 100 MG tablet TAKE 1 TABLET BY MOUTH EVERY DAY  30 tablet  1  . tamsulosin (FLOMAX) 0.4 MG CAPS capsule TAKE 1 CAPSULE BY MOUTH TWICE DAILY  60 capsule  4  . zolpidem (AMBIEN CR) 12.5 MG CR tablet TAKE 1 TABLET BY MOUTH EVERY NIGHT AT BEDTIME AS NEEDED FOR SLEEP  30 tablet  0   No current facility-administered medications on file prior to visit.    Allergies  Allergen Reactions  . Simvastatin     REACTION: Rash    Family History  Problem Relation Age of Onset  . Cancer Father     Gastric  cancer    BP 136/78  Pulse 95  Temp(Src) 98.5 F (36.9 C) (Oral)  Ht 5' 10"  (1.778 m)  Wt 197 lb (89.359 kg)  BMI 28.27 kg/m2  SpO2 95%   Review of Systems  Constitutional: Negative for fever.  HENT: Negative for hearing loss.   Eyes: Negative for visual disturbance.  Respiratory: Negative for shortness of breath.   Cardiovascular: Negative for chest pain.  Gastrointestinal: Negative for anal bleeding.  Endocrine: Negative for cold intolerance.  Genitourinary: Negative for hematuria and difficulty urinating.  Musculoskeletal: Negative for gait problem.  Skin: Negative for rash.  Allergic/Immunologic: Negative for environmental allergies.  Neurological: Negative for syncope.  Hematological: Does not bruise/bleed easily.  Psychiatric/Behavioral: Negative for dysphoric mood.       Objective:   Physical Exam VS: see vs page GEN: no distress HEAD: head: no deformity eyes: no periorbital swelling, no proptosis external nose and ears are normal mouth: no lesion seen NECK: supple, thyroid is not  enlarged CHEST WALL: no deformity LUNGS: clear to auscultation BREASTS:  No gynecomastia CV: reg rate and rhythm, no murmur ABD: abdomen is soft, nontender.  no hepatosplenomegaly.  not distended.  no hernia GENITALIA/RECTAL/PROSTATE: sees urology MUSCULOSKELETAL: muscle bulk and strength are grossly normal.  no obvious joint swelling.  gait is normal and steady EXTEMITIES: no deformity.  no ulcer on the feet.  feet are of normal color and temp.  no edema PULSES: dorsalis pedis intact bilat.  no carotid bruit NEURO:  cn 2-12 grossly intact.   readily moves all 4's.  sensation is intact to touch on the feet SKIN:  Normal texture and temperature.  No rash or suspicious lesion is visible.   NODES:  None palpable at the neck PSYCH: alert, well-oriented.  Does not appear anxious nor depressed.   i reviewed electrocardiogram.     Assessment & Plan:  Wellness visit today, with problems stable, except as noted.    SEPARATE EVALUATION FOLLOWS--EACH PROBLEM HERE IS NEW, NOT RESPONDING TO TREATMENT, OR POSES SIGNIFICANT RISK TO THE PATIENT'S HEALTH: HISTORY OF THE PRESENT ILLNESS: Pt has few mos of moderate worsening of his chronic low-back pain.  No assoc numbness.  PAST MEDICAL HISTORY reviewed and up to date today. REVIEW OF SYSTEMS: Denies weight loss.  He has insomnia PHYSICAL EXAMINATION: VITAL SIGNS:  See vs page GENERAL: no distress Slight tenderness at the right lumbar paravertebral area.  Neuro: sensation is intact to touch on the LE's.   Gait: normal and steady.  LAB/XRAY RESULTS: (i reviewed old x-ray report) IMPRESSION: Low-back pain, recurrent Insomnia: possibly due to pain PLAN: See instruction page

## 2013-08-31 NOTE — Patient Instructions (Addendum)
please consider these measures for your health:  minimize alcohol.  do not use tobacco products.  have a colonoscopy at least every 10 years from age 58.  keep firearms safely stored.  always use seat belts.  have working smoke alarms in your home.  see an eye doctor and dentist regularly.  never drive under the influence of alcohol or drugs (including prescription drugs).  those with fair skin should take precautions against the sun. Here is a prescription for the back pain. Let's check the MRI.  Refer to a specialist.  you will receive a phone call, about a day and time for an appointment.

## 2013-09-11 ENCOUNTER — Other Ambulatory Visit: Payer: Self-pay | Admitting: Endocrinology

## 2013-09-24 ENCOUNTER — Other Ambulatory Visit: Payer: Self-pay | Admitting: Endocrinology

## 2013-10-08 ENCOUNTER — Other Ambulatory Visit: Payer: Self-pay | Admitting: Endocrinology

## 2013-10-30 ENCOUNTER — Other Ambulatory Visit: Payer: Self-pay | Admitting: Endocrinology

## 2013-11-10 ENCOUNTER — Other Ambulatory Visit: Payer: Self-pay | Admitting: Endocrinology

## 2013-11-26 ENCOUNTER — Other Ambulatory Visit: Payer: Self-pay | Admitting: Endocrinology

## 2013-12-14 ENCOUNTER — Other Ambulatory Visit: Payer: Self-pay | Admitting: Endocrinology

## 2013-12-14 NOTE — Telephone Encounter (Signed)
Please advise in Dr Cordelia Pen absence.

## 2013-12-23 ENCOUNTER — Other Ambulatory Visit: Payer: Self-pay | Admitting: Endocrinology

## 2014-01-14 ENCOUNTER — Other Ambulatory Visit: Payer: Self-pay | Admitting: Endocrinology

## 2014-01-14 ENCOUNTER — Other Ambulatory Visit: Payer: Self-pay | Admitting: Internal Medicine

## 2014-01-15 ENCOUNTER — Other Ambulatory Visit: Payer: Self-pay | Admitting: Endocrinology

## 2014-01-16 NOTE — Telephone Encounter (Signed)
Rx faxed to pharmacy  

## 2014-01-22 ENCOUNTER — Other Ambulatory Visit: Payer: Self-pay | Admitting: Internal Medicine

## 2014-01-25 ENCOUNTER — Telehealth: Payer: Self-pay | Admitting: Endocrinology

## 2014-01-25 NOTE — Telephone Encounter (Signed)
Please see below.

## 2014-01-25 NOTE — Telephone Encounter (Signed)
Pt needs refill on zoloft

## 2014-01-25 NOTE — Telephone Encounter (Signed)
Please refill prn 

## 2014-01-26 MED ORDER — SERTRALINE HCL 100 MG PO TABS: ORAL_TABLET | ORAL | Status: DC

## 2014-01-26 NOTE — Addendum Note (Signed)
Addended by: Moody Bruins E on: 01/26/2014 08:02 AM   Modules accepted: Orders

## 2014-01-26 NOTE — Telephone Encounter (Signed)
Rx refilled.

## 2014-01-29 ENCOUNTER — Other Ambulatory Visit: Payer: Self-pay | Admitting: Endocrinology

## 2014-02-12 ENCOUNTER — Telehealth: Payer: Self-pay | Admitting: Endocrinology

## 2014-02-12 NOTE — Telephone Encounter (Signed)
Form placed on MD's desk, waiting on response.

## 2014-02-12 NOTE — Telephone Encounter (Signed)
Zolpidem has been denied please reply to the fax we should be receiving regarding PA

## 2014-02-16 ENCOUNTER — Encounter: Payer: Self-pay | Admitting: Gastroenterology

## 2014-02-26 ENCOUNTER — Other Ambulatory Visit: Payer: Self-pay | Admitting: Endocrinology

## 2014-03-22 ENCOUNTER — Other Ambulatory Visit: Payer: Self-pay | Admitting: Endocrinology

## 2014-04-01 ENCOUNTER — Other Ambulatory Visit: Payer: Self-pay | Admitting: Endocrinology

## 2014-04-24 ENCOUNTER — Other Ambulatory Visit: Payer: Self-pay | Admitting: Endocrinology

## 2014-04-25 MED ORDER — ALPRAZOLAM 0.5 MG PO TABS: 0.5000 mg | ORAL_TABLET | Freq: Three times a day (TID) | ORAL | Status: DC | PRN

## 2014-04-25 MED ORDER — ZOLPIDEM TARTRATE ER 12.5 MG PO TBCR: 12.5000 mg | EXTENDED_RELEASE_TABLET | Freq: Every evening | ORAL | Status: DC | PRN

## 2014-04-25 MED ORDER — DUTASTERIDE 0.5 MG PO CAPS: 0.5000 mg | ORAL_CAPSULE | Freq: Every day | ORAL | Status: DC

## 2014-05-13 ENCOUNTER — Other Ambulatory Visit: Payer: Self-pay | Admitting: Endocrinology

## 2014-05-14 ENCOUNTER — Other Ambulatory Visit: Payer: Self-pay | Admitting: Endocrinology

## 2014-05-16 ENCOUNTER — Encounter: Payer: Self-pay | Admitting: Endocrinology

## 2014-05-16 ENCOUNTER — Ambulatory Visit (INDEPENDENT_AMBULATORY_CARE_PROVIDER_SITE_OTHER): Payer: BC Managed Care – PPO | Admitting: Endocrinology

## 2014-05-16 ENCOUNTER — Telehealth: Payer: Self-pay | Admitting: Endocrinology

## 2014-05-16 VITALS — BP 134/84 | HR 85 | Temp 98.1°F | Ht 70.0 in | Wt 204.0 lb

## 2014-05-16 DIAGNOSIS — J069 Acute upper respiratory infection, unspecified: Secondary | ICD-10-CM

## 2014-05-16 MED ORDER — FLUTICASONE-SALMETEROL 100-50 MCG/DOSE IN AEPB: 1.0000 | INHALATION_SPRAY | Freq: Two times a day (BID) | RESPIRATORY_TRACT | Status: DC

## 2014-05-16 MED ORDER — AZITHROMYCIN 500 MG PO TABS: 500.0000 mg | ORAL_TABLET | Freq: Every day | ORAL | Status: DC

## 2014-05-16 NOTE — Telephone Encounter (Signed)
i called pt.  i have sent a prescription to your pharmacy

## 2014-05-16 NOTE — Telephone Encounter (Signed)
Patient stated that the Pharmacy didn't receive his antibotics. But they received the [place].

## 2014-05-16 NOTE — Patient Instructions (Addendum)
i have sent a prescription to your pharmacy, for an antibiotic.   Loratadine-d (non-prescription) will help your congestion.   i have sent also prescription to your pharmacy, to refill the inhaler.   Please let me know if you want to redo the breathing test (it is better to wait until this illness is better).

## 2014-05-16 NOTE — Progress Notes (Signed)
Subjective:    Patient ID: Luis Peterson, male    DOB: 1955-11-12, 59 y.o.   MRN: [id number]  HPI Pt states few days of slight pain at the bilat maxillary areas, and assoc nasal congestion.  Several family members have had similar sxs.   Past Medical History  Diagnosis Date  . ANXIETY 01/03/2007    Qualifier: Diagnosis of  By: Loanne Drilling MD, Jacelyn Pi   . 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 Consuella Lose  . DYSPNEA 09/01/2007    Qualifier: Diagnosis of  By: Loanne Drilling MD, Jacelyn Pi   . GERD 06/19/2008    Qualifier: Diagnosis of  By: Loanne Drilling MD, Jacelyn Pi   . HEARING LOSS 06/19/2008    Qualifier: Diagnosis of  By: Loanne Drilling MD, Jacelyn Pi   . HYPERCHOLESTEROLEMIA 09/01/2007    Qualifier: Diagnosis of  By: Loanne Drilling MD, Jacelyn Pi   . 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 Unspecified disorder of liver 06/19/2008    Qualifier: Diagnosis of  By: Loanne Drilling MD, Jone Baseman, SPINE 06/19/2008    Qualifier: Diagnosis of  By: Loanne Drilling MD, Jacelyn Pi Positive H. pylori test 07/1997  . Degenerative joint disease of cervical spine     Past Surgical History  Procedure Laterality Date  . Tympanoplasty  03/1996    Right  . Stress cardiolite  08/22/2003  . Electrocardiogram  05/28/2006    History   Social History  . Marital Status: Married    Spouse Name: N/A    Number of Children: N/A  . Years of Education: N/A   Occupational History  . College Professor  [employer]   Social History Main Topics  . Smoking status: Former Research scientist (life sciences)  . Smokeless tobacco: Not on file  . Alcohol Use: Not on file  . Drug Use: Not on file  . Sexual Activity: Not on file   Other Topics Concern  . Not on file   Social History Narrative    Current Outpatient Prescriptions on File Prior to Visit  Medication Sig Dispense  Refill  . ALPRAZolam (XANAX) 0.5 MG tablet Take 1 tablet (0.5 mg total) by mouth 3 (three) times daily as needed. for anxiety 90 tablet 0  . atorvastatin (LIPITOR) 10 MG tablet TAKE 1 TABLET BY MOUTH DAILY 90 tablet 0  . dutasteride (AVODART) 0.5 MG capsule Take 1 capsule (0.5 mg total) by mouth daily. 90 capsule 0  . fenofibrate 54 MG tablet TAKE 1 TABLET BY MOUTH EVERY DAY 30 tablet 0  . omeprazole (PRILOSEC) 20 MG capsule TAKE ONE CAPSULE BY MOUTH DAILY 90 capsule 3  . sertraline (ZOLOFT) 100 MG tablet TAKE 1 TABLET BY MOUTH EVERY DAY 30 tablet 0  . tamsulosin (FLOMAX) 0.4 MG CAPS capsule TAKE ONE CAPSULE BY MOUTH TWICE DAILY 60 capsule 0  . traMADol-acetaminophen (ULTRACET) 37.5-325 MG per tablet Take 1 tablet by mouth every 4 (four) hours as needed. 50 tablet 0  . zolpidem (AMBIEN CR) 12.5 MG CR tablet Take 1 tablet (12.5 mg total) by mouth at bedtime as needed. for sleep 30 tablet 0   No current facility-administered medications on file prior to visit.    Allergies  Allergen Reactions  . Simvastatin     REACTION: Rash    Family History  Problem Relation Age of Onset  . Cancer Father     Gastric cancer    BP 134/84 mmHg  Pulse 85  Temp(Src) 98.1 F (36.7 C) (Oral)  Ht 5' 10"  (1.778 m)  Wt 204 lb (92.534 kg)  BMI 29.27 kg/m2  SpO2 96%  Review of Systems He has bilat otalgia and myalgias, but no fever.  He has a slight cough.  Mild chronic intermittent doe persists.     Objective:   Physical Exam VITAL SIGNS:  See vs page GENERAL: no distress head: no deformity eyes: no periorbital swelling, no proptosis external nose and ears are normal mouth: no lesion seen Face: slight bilat maxillary tenderness left eac and tm is normal.  Right eac is occluded with cerumen.   LUNGS:  Clear to auscultation      Assessment & Plan:  URI, new Doe, persistent  Patient is advised the following: Patient Instructions  i have sent a prescription to your pharmacy, for an  antibiotic.   Loratadine-d (non-prescription) will help your congestion.   i have sent also prescription to your pharmacy, to refill the inhaler.   Please let me know if you want to redo the breathing test (it is better to wait until this illness is better).

## 2014-05-16 NOTE — Telephone Encounter (Signed)
See below and please advise, Thanks!  

## 2014-06-01 ENCOUNTER — Other Ambulatory Visit: Payer: Self-pay | Admitting: Endocrinology

## 2014-06-04 ENCOUNTER — Other Ambulatory Visit: Payer: Self-pay | Admitting: Endocrinology

## 2014-06-09 ENCOUNTER — Other Ambulatory Visit: Payer: Self-pay | Admitting: Endocrinology

## 2014-07-04 ENCOUNTER — Other Ambulatory Visit: Payer: Self-pay | Admitting: Endocrinology

## 2014-07-18 ENCOUNTER — Other Ambulatory Visit: Payer: Self-pay | Admitting: Endocrinology

## 2014-07-19 ENCOUNTER — Other Ambulatory Visit: Payer: Self-pay | Admitting: Endocrinology

## 2014-07-20 ENCOUNTER — Other Ambulatory Visit: Payer: Self-pay | Admitting: *Deleted

## 2014-07-20 MED ORDER — ALPRAZOLAM 0.5 MG PO TABS: 0.5000 mg | ORAL_TABLET | Freq: Three times a day (TID) | ORAL | Status: DC | PRN

## 2014-07-20 MED ORDER — FENOFIBRATE 54 MG PO TABS: 54.0000 mg | ORAL_TABLET | Freq: Every day | ORAL | Status: DC

## 2014-08-05 ENCOUNTER — Other Ambulatory Visit: Payer: Self-pay | Admitting: Endocrinology

## 2014-08-22 ENCOUNTER — Other Ambulatory Visit: Payer: Self-pay | Admitting: Endocrinology

## 2014-08-30 ENCOUNTER — Other Ambulatory Visit: Payer: Self-pay | Admitting: Endocrinology

## 2014-08-31 ENCOUNTER — Other Ambulatory Visit: Payer: Self-pay | Admitting: Endocrinology

## 2014-09-03 ENCOUNTER — Ambulatory Visit
Admission: RE | Admit: 2014-09-03 | Discharge: 2014-09-03 | Disposition: A | Discharge status: Home / Self Care | Payer: BC Managed Care – PPO | Source: Ambulatory Visit | Attending: Endocrinology | Admitting: Endocrinology

## 2014-09-03 ENCOUNTER — Ambulatory Visit (INDEPENDENT_AMBULATORY_CARE_PROVIDER_SITE_OTHER): Payer: BC Managed Care – PPO | Admitting: Endocrinology

## 2014-09-03 ENCOUNTER — Encounter: Payer: Self-pay | Admitting: Endocrinology

## 2014-09-03 VITALS — BP 116/78 | HR 77 | Temp 97.8°F | Ht 70.0 in | Wt 202.0 lb

## 2014-09-03 DIAGNOSIS — R0600 Dyspnea, unspecified: Secondary | ICD-10-CM

## 2014-09-03 DIAGNOSIS — Z23 Encounter for immunization: Secondary | ICD-10-CM | POA: Diagnosis not present

## 2014-09-03 DIAGNOSIS — H93291 Other abnormal auditory perceptions, right ear: Secondary | ICD-10-CM | POA: Diagnosis not present

## 2014-09-03 DIAGNOSIS — R739 Hyperglycemia, unspecified: Secondary | ICD-10-CM

## 2014-09-03 DIAGNOSIS — Z0000 Encounter for general adult medical examination without abnormal findings: Secondary | ICD-10-CM | POA: Diagnosis not present

## 2014-09-03 DIAGNOSIS — Z125 Encounter for screening for malignant neoplasm of prostate: Secondary | ICD-10-CM | POA: Diagnosis not present

## 2014-09-03 DIAGNOSIS — H9191 Unspecified hearing loss, right ear: Secondary | ICD-10-CM

## 2014-09-03 HISTORY — DX: Encounter for screening for malignant neoplasm of prostate: Z12.5

## 2014-09-03 LAB — PSA: PSA: 0.69 ng/mL (ref 0.10–4.00)

## 2014-09-03 LAB — HEPATIC FUNCTION PANEL
ALT: 32 U/L (ref 0–53)
AST: 22 U/L (ref 0–37)
Albumin: 4.3 g/dL (ref 3.5–5.2)
Alkaline Phosphatase: 44 U/L (ref 39–117)
Bilirubin, Direct: 0.1 mg/dL (ref 0.0–0.3)
Total Bilirubin: 0.6 mg/dL (ref 0.2–1.2)
Total Protein: 7.1 g/dL (ref 6.0–8.3)

## 2014-09-03 LAB — CBC WITH DIFFERENTIAL/PLATELET
Basophils Absolute: 0 10*3/uL (ref 0.0–0.1)
Basophils Relative: 0.2 % (ref 0.0–3.0)
Eosinophils Absolute: 0.2 10*3/uL (ref 0.0–0.7)
Eosinophils Relative: 2.4 % (ref 0.0–5.0)
HCT: 40.5 % (ref 39.0–52.0)
Hemoglobin: 13.8 g/dL (ref 13.0–17.0)
LYMPHS PCT: 34 % (ref 12.0–46.0)
Lymphs Abs: 2.5 10*3/uL (ref 0.7–4.0)
MCHC: 34 g/dL (ref 30.0–36.0)
MCV: 86.8 fl (ref 78.0–100.0)
Monocytes Absolute: 0.6 10*3/uL (ref 0.1–1.0)
Monocytes Relative: 7.8 % (ref 3.0–12.0)
NEUTROS ABS: 4 10*3/uL (ref 1.4–7.7)
Neutrophils Relative %: 55.6 % (ref 43.0–77.0)
Platelets: 192 10*3/uL (ref 150.0–400.0)
RBC: 4.67 Mil/uL (ref 4.22–5.81)
RDW: 12.8 % (ref 11.5–15.5)
WBC: 7.2 10*3/uL (ref 4.0–10.5)

## 2014-09-03 LAB — URINALYSIS, ROUTINE W REFLEX MICROSCOPIC
Bilirubin Urine: NEGATIVE
Hgb urine dipstick: NEGATIVE
Ketones, ur: NEGATIVE
Leukocytes, UA: NEGATIVE
Nitrite: NEGATIVE
RBC / HPF: NONE SEEN (ref 0–?)
Specific Gravity, Urine: 1.025 (ref 1.000–1.030)
Total Protein, Urine: NEGATIVE
URINE GLUCOSE: NEGATIVE
Urobilinogen, UA: 0.2 (ref 0.0–1.0)
pH: 6 (ref 5.0–8.0)

## 2014-09-03 LAB — TSH: TSH: 0.66 u[IU]/mL (ref 0.35–4.50)

## 2014-09-03 LAB — LIPID PANEL
CHOLESTEROL: 143 mg/dL (ref 0–200)
HDL: 49.3 mg/dL (ref >39.00)
LDL CALC: 68 mg/dL (ref 0–99)
NONHDL: 93.7
Total CHOL/HDL Ratio: 3
Triglycerides: 129 mg/dL (ref 0.0–149.0)
VLDL: 25.8 mg/dL (ref 0.0–40.0)

## 2014-09-03 LAB — BASIC METABOLIC PANEL
BUN: 17 mg/dL (ref 6–23)
CALCIUM: 9.3 mg/dL (ref 8.4–10.5)
CO2: 24 mEq/L (ref 19–32)
Chloride: 107 mEq/L (ref 96–112)
Creatinine, Ser: 0.81 mg/dL (ref 0.40–1.50)
GFR: 104.15 mL/min (ref >60.00)
GLUCOSE: 108 mg/dL — AB (ref 70–99)
POTASSIUM: 4.2 meq/L (ref 3.5–5.1)
SODIUM: 137 meq/L (ref 135–145)

## 2014-09-03 LAB — HEMOGLOBIN A1C: HEMOGLOBIN A1C: 5.8 % (ref 4.6–6.5)

## 2014-09-03 NOTE — Progress Notes (Signed)
Subjective:    Patient ID: Luis Peterson, male    DOB: 30-Apr-1956, 59 y.o.   MRN: [id number]  HPI Pt is here for regular wellness examination, and is feeling pretty well in general, and says chronic med probs are stable, except as noted below. Past Medical History  Diagnosis Date  . ANXIETY 01/03/2007    Qualifier: Diagnosis of  By: Loanne Drilling MD, Jacelyn Pi   . 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 Consuella Lose  . DYSPNEA 09/01/2007    Qualifier: Diagnosis of  By: Loanne Drilling MD, Jacelyn Pi   . GERD 06/19/2008    Qualifier: Diagnosis of  By: Loanne Drilling MD, Jacelyn Pi   . HEARING LOSS 06/19/2008    Qualifier: Diagnosis of  By: Loanne Drilling MD, Jacelyn Pi   . HYPERCHOLESTEROLEMIA 09/01/2007    Qualifier: Diagnosis of  By: Loanne Drilling MD, Jacelyn Pi   . 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 Unspecified disorder of liver 06/19/2008    Qualifier: Diagnosis of  By: Loanne Drilling MD, Jone Baseman, SPINE 06/19/2008    Qualifier: Diagnosis of  By: Loanne Drilling MD, Jacelyn Pi Positive H. pylori test 07/1997  . Degenerative joint disease of cervical spine     Past Surgical History  Procedure Laterality Date  . Tympanoplasty  03/1996    Right  . Stress cardiolite  08/22/2003  . Electrocardiogram  05/28/2006    History   Social History  . Marital Status: Married    Spouse Name: N/A  . Number of Children: N/A  . Years of Education: N/A   Occupational History  . College Professor  [employer]   Social History Main Topics  . Smoking status: Former Research scientist (life sciences)  . Smokeless tobacco: Not on file  . Alcohol Use: Not on file  . Drug Use: Not on file  . Sexual Activity: Not on file   Other Topics Concern  . Not on file   Social History Narrative    Current Outpatient Prescriptions on File Prior to Visit  Medication Sig  Dispense Refill  . ALPRAZolam (XANAX) 0.5 MG tablet TAKE 1 TABLET BY MOUTH THREE TIMES DAILY AS NEEDED 90 tablet 0  . atorvastatin (LIPITOR) 10 MG tablet TAKE 1 TABLET BY MOUTH EVERY DAY 90 tablet 0  . dutasteride (AVODART) 0.5 MG capsule Take 1 capsule (0.5 mg total) by mouth daily. 90 capsule 0  . fenofibrate 54 MG tablet Take 1 tablet (54 mg total) by mouth daily. 30 tablet 1  . Fluticasone-Salmeterol (ADVAIR) 100-50 MCG/DOSE AEPB Inhale 1 puff into the lungs every 12 (twelve) hours. 1 each 3  . omeprazole (PRILOSEC) 20 MG capsule TAKE ONE CAPSULE BY MOUTH DAILY 90 capsule 0  . sertraline (ZOLOFT) 100 MG tablet TAKE 1 TABLET BY MOUTH DAILY 30 tablet 0  . tamsulosin (FLOMAX) 0.4 MG CAPS capsule TAKE ONE CAPSULE BY MOUTH TWICE DAILY 60 capsule 0  . traMADol-acetaminophen (ULTRACET) 37.5-325 MG per tablet Take 1 tablet by mouth every 4 (four) hours as needed. 50 tablet 0  . zolpidem (AMBIEN CR) 12.5 MG CR tablet TAKE 1 TABLET BY MOUTH EVERY NIGHT AT BEDTIME AS NEEDED FOR SLEEP 30 tablet 0  No current facility-administered medications on file prior to visit.    Allergies  Allergen Reactions  . Simvastatin     REACTION: Rash    Family History  Problem Relation Age of Onset  . Cancer Father     Gastric cancer    BP 116/78 mmHg  Pulse 77  Temp(Src) 97.8 F (36.6 C) (Oral)  Ht 5' 10"  (1.778 m)  Wt 202 lb (91.627 kg)  BMI 28.98 kg/m2  SpO2 97%  Review of Systems  Constitutional: Negative for fever.  HENT: Negative for hearing loss.   Eyes: Negative for visual disturbance.  Respiratory: Negative for cough.   Cardiovascular: Negative for chest pain.  Gastrointestinal: Negative for anal bleeding.  Endocrine: Negative for cold intolerance.  Genitourinary: Negative for hematuria.  Musculoskeletal: Positive for back pain.  Skin: Negative for rash.  Allergic/Immunologic: Negative for environmental allergies.  Neurological: Negative for syncope and numbness.  Hematological: Does  not bruise/bleed easily.  Psychiatric/Behavioral:       Depression is well-controlled       Objective:   Physical Exam VS: see vs page GEN: no distress HEAD: head: no deformity eyes: no periorbital swelling, no proptosis external nose and ears are normal mouth: no lesion seen NECK: supple, thyroid is not enlarged CHEST WALL: no deformity LUNGS: clear to auscultation BREASTS:  No gynecomastia CV: reg rate and rhythm, no murmur ABD: abdomen is soft, nontender.  no hepatosplenomegaly.  not distended.  no hernia.   GENITALIA/RECTAL/PROSTATE: sees urology.  MUSCULOSKELETAL: muscle bulk and strength are grossly normal.  no obvious joint swelling.  gait is normal and steady EXTEMITIES: no deformity.  no ulcer on the feet.  feet are of normal color and temp.  no edema PULSES: dorsalis pedis intact bilat.  no carotid bruit NEURO:  cn 2-12 grossly intact.   readily moves all 4's.  sensation is intact to touch on the feet SKIN:  Normal texture and temperature.  No rash or suspicious lesion is visible.   NODES:  None palpable at the neck PSYCH: alert, well-oriented.  Does not appear anxious nor depressed.      Assessment & Plan:  Wellness visit today, with problems stable, except as noted.    SEPARATE EVALUATION FOLLOWS--EACH PROBLEM HERE IS NEW, NOT RESPONDING TO TREATMENT, OR POSES SIGNIFICANT RISK TO THE PATIENT'S HEALTH: HISTORY OF THE PRESENT ILLNESS: Pt states decreased hearing from the right ear, but no assoc pain PAST MEDICAL HISTORY reviewed and up to date today REVIEW OF SYSTEMS: Denies weight change.  insomnia has recurred, despite same [place]. PHYSICAL EXAMINATION: VITAL SIGNS:  See vs page GENERAL: no distress Ears: right eac: occluded with cerumen IMPRESSION: Cerumen impaction, new.  We can't do irrigation, due to h/o TM surgery Hearing loss, prob due to cerumen impaction Insomnia, worse PLAN:  Try taking more of the alprazolam later in the day.   Please see an  ear specialist.  you will receive a phone call, about a day and time for an appointment

## 2014-09-03 NOTE — Patient Instructions (Addendum)
please consider these measures for your health:  minimize alcohol.  do not use tobacco products.  have a colonoscopy at least every 10 years from age 59.  keep firearms safely stored.  always use seat belts.  have working smoke alarms in your home.  see an eye doctor and dentist regularly.  never drive under the influence of alcohol or drugs (including prescription drugs).  those with fair skin should take precautions against the sun. blood tests are requested for you today.  We'll let you know about the results.  Try taking more of the alprazolam later in the day.   Please see an ear specialist.  you will receive a phone call, about a day and time for an appointment      Insomnia Insomnia is frequent trouble falling and/or staying asleep. Insomnia can be a long term problem or a short term problem. Both are common. Insomnia can be a short term problem when the wakefulness is related to a certain stress or worry. Long term insomnia is often related to ongoing stress during waking hours and/or poor sleeping habits. Overtime, sleep deprivation itself can make the problem worse. Every little thing feels more severe because you are overtired and your ability to cope is decreased. CAUSES   Stress, anxiety, and depression.  Poor sleeping habits.  Distractions such as TV in the bedroom.  Naps close to bedtime.  Engaging in emotionally charged conversations before bed.  Technical reading before sleep.  Alcohol and other sedatives. They may make the problem worse. They can hurt normal sleep patterns and normal dream activity.  Stimulants such as caffeine for several hours prior to bedtime.  Pain syndromes and shortness of breath can cause insomnia.  Exercise late at night.  Changing time zones may cause sleeping problems (jet lag). It is sometimes helpful to have someone observe your sleeping patterns. They should look for periods of not breathing during the night (sleep apnea). They should  also look to see how long those periods last. If you live alone or observers are uncertain, you can also be observed at a sleep clinic where your sleep patterns will be professionally monitored. Sleep apnea requires a checkup and treatment. Give your caregivers your medical history. Give your caregivers observations your family has made about your sleep.  SYMPTOMS   Not feeling rested in the morning.  Anxiety and restlessness at bedtime.  Difficulty falling and staying asleep. TREATMENT   Your caregiver may prescribe treatment for an underlying medical disorders. Your caregiver can give advice or help if you are using alcohol or other drugs for self-medication. Treatment of underlying problems will usually eliminate insomnia problems.  Medications can be prescribed for short time use. They are generally not recommended for lengthy use.  Over-the-counter sleep medicines are not recommended for lengthy use. They can be habit forming.  You can promote easier sleeping by making lifestyle changes such as:  Using relaxation techniques that help with breathing and reduce muscle tension.  Exercising earlier in the day.  Changing your diet and the time of your last meal. No night time snacks.  Establish a regular time to go to bed.  Counseling can help with stressful problems and worry.  Soothing music and white noise may be helpful if there are background noises you cannot remove.  Stop tedious detailed work at least one hour before bedtime. HOME CARE INSTRUCTIONS   Keep a diary. Inform your caregiver about your progress. This includes any medication side effects. See your  caregiver regularly. Take note of:  Times when you are asleep.  Times when you are awake during the night.  The quality of your sleep.  How you feel the next day. This information will help your caregiver care for you.  Get out of bed if you are still awake after 15 minutes. Read or do some quiet activity. Keep  the lights down. Wait until you feel sleepy and go back to bed.  Keep regular sleeping and waking hours. Avoid naps.  Exercise regularly.  Avoid distractions at bedtime. Distractions include watching television or engaging in any intense or detailed activity like attempting to balance the household checkbook.  Develop a bedtime ritual. Keep a familiar routine of bathing, brushing your teeth, climbing into bed at the same time each night, listening to soothing music. Routines increase the success of falling to sleep faster.  Use relaxation techniques. This can be using breathing and muscle tension release routines. It can also include visualizing peaceful scenes. You can also help control troubling or intruding thoughts by keeping your mind occupied with boring or repetitive thoughts like the old concept of counting sheep. You can make it more creative like imagining planting one beautiful flower after another in your backyard garden.  During your day, work to eliminate stress. When this is not possible use some of the previous suggestions to help reduce the anxiety that accompanies stressful situations. MAKE SURE YOU:   Understand these instructions.  Will watch your condition.  Will get help right away if you are not doing well or get worse. Document Released: 04/24/2000 Document Revised: 07/20/2011 Document Reviewed: 05/25/2007 [facility] Patient Information 2015 [place], [place]. This information is not intended to replace advice given to you by your health care provider. Make sure you discuss any questions you have with your health care provider.

## 2014-09-10 ENCOUNTER — Other Ambulatory Visit: Payer: Self-pay | Admitting: Endocrinology

## 2014-09-23 ENCOUNTER — Other Ambulatory Visit: Payer: Self-pay | Admitting: Endocrinology

## 2014-10-04 ENCOUNTER — Encounter: Payer: Self-pay | Admitting: Endocrinology

## 2014-10-09 ENCOUNTER — Encounter: Payer: Self-pay | Admitting: Endocrinology

## 2014-10-19 ENCOUNTER — Other Ambulatory Visit: Payer: Self-pay | Admitting: Endocrinology

## 2014-11-02 ENCOUNTER — Other Ambulatory Visit: Payer: Self-pay | Admitting: Endocrinology

## 2014-11-10 ENCOUNTER — Other Ambulatory Visit: Payer: Self-pay | Admitting: Endocrinology

## 2014-12-04 ENCOUNTER — Other Ambulatory Visit: Payer: Self-pay | Admitting: Endocrinology

## 2014-12-05 ENCOUNTER — Other Ambulatory Visit: Payer: Self-pay | Admitting: Endocrinology

## 2014-12-16 ENCOUNTER — Other Ambulatory Visit: Payer: Self-pay | Admitting: Endocrinology

## 2014-12-29 ENCOUNTER — Other Ambulatory Visit: Payer: Self-pay | Admitting: Endocrinology

## 2015-01-09 ENCOUNTER — Other Ambulatory Visit: Payer: Self-pay | Admitting: Endocrinology

## 2015-01-21 ENCOUNTER — Other Ambulatory Visit: Payer: Self-pay | Admitting: Endocrinology

## 2015-01-26 ENCOUNTER — Other Ambulatory Visit: Payer: Self-pay | Admitting: Endocrinology

## 2015-02-02 ENCOUNTER — Other Ambulatory Visit: Payer: Self-pay | Admitting: Endocrinology

## 2015-02-19 ENCOUNTER — Other Ambulatory Visit: Payer: Self-pay | Admitting: Endocrinology

## 2015-02-28 ENCOUNTER — Other Ambulatory Visit: Payer: Self-pay | Admitting: Endocrinology

## 2015-03-07 DIAGNOSIS — Z0279 Encounter for issue of other medical certificate: Secondary | ICD-10-CM

## 2015-03-24 ENCOUNTER — Other Ambulatory Visit: Payer: Self-pay | Admitting: Endocrinology

## 2015-04-01 ENCOUNTER — Other Ambulatory Visit: Payer: Self-pay | Admitting: Endocrinology

## 2015-04-24 ENCOUNTER — Other Ambulatory Visit: Payer: Self-pay | Admitting: Endocrinology

## 2015-05-07 ENCOUNTER — Other Ambulatory Visit: Payer: Self-pay | Admitting: Endocrinology

## 2015-05-17 ENCOUNTER — Other Ambulatory Visit: Payer: Self-pay | Admitting: Endocrinology

## 2015-05-28 ENCOUNTER — Other Ambulatory Visit: Payer: Self-pay | Admitting: Endocrinology

## 2015-05-28 MED ORDER — ATORVASTATIN CALCIUM 10 MG PO TABS: 10.0000 mg | ORAL_TABLET | Freq: Every day | ORAL | Status: DC

## 2015-05-28 MED ORDER — ALPRAZOLAM 0.5 MG PO TABS: 0.5000 mg | ORAL_TABLET | Freq: Three times a day (TID) | ORAL | Status: DC | PRN

## 2015-05-28 MED ORDER — ZOLPIDEM TARTRATE ER 12.5 MG PO TBCR: 12.5000 mg | EXTENDED_RELEASE_TABLET | Freq: Every evening | ORAL | Status: DC | PRN

## 2015-05-31 ENCOUNTER — Other Ambulatory Visit: Payer: Self-pay | Admitting: Endocrinology

## 2015-06-28 ENCOUNTER — Other Ambulatory Visit: Payer: Self-pay | Admitting: Endocrinology

## 2015-07-03 ENCOUNTER — Other Ambulatory Visit: Payer: Self-pay | Admitting: Endocrinology

## 2015-07-21 ENCOUNTER — Other Ambulatory Visit: Payer: Self-pay | Admitting: Endocrinology

## 2015-08-06 ENCOUNTER — Other Ambulatory Visit: Payer: Self-pay | Admitting: Endocrinology

## 2015-08-30 ENCOUNTER — Other Ambulatory Visit: Payer: Self-pay | Admitting: Endocrinology

## 2015-08-30 NOTE — Telephone Encounter (Signed)
i printed cpx is due

## 2015-09-04 ENCOUNTER — Other Ambulatory Visit: Payer: Self-pay | Admitting: Endocrinology

## 2015-09-05 ENCOUNTER — Telehealth: Payer: Self-pay | Admitting: Endocrinology

## 2015-09-05 MED ORDER — ALPRAZOLAM 0.5 MG PO TABS: 0.5000 mg | ORAL_TABLET | Freq: Three times a day (TID) | ORAL | Status: DC | PRN

## 2015-09-05 MED ORDER — SERTRALINE HCL 100 MG PO TABS: 100.0000 mg | ORAL_TABLET | Freq: Every day | ORAL | Status: DC

## 2015-09-05 MED ORDER — DUTASTERIDE 0.5 MG PO CAPS: 0.5000 mg | ORAL_CAPSULE | Freq: Every day | ORAL | Status: DC

## 2015-09-05 MED ORDER — FENOFIBRATE 54 MG PO TABS: 54.0000 mg | ORAL_TABLET | Freq: Every day | ORAL | Status: DC

## 2015-09-21 ENCOUNTER — Other Ambulatory Visit: Payer: Self-pay | Admitting: Endocrinology

## 2015-09-22 ENCOUNTER — Other Ambulatory Visit: Payer: Self-pay | Admitting: Endocrinology

## 2015-09-23 ENCOUNTER — Other Ambulatory Visit: Payer: Self-pay

## 2015-09-24 MED ORDER — FENOFIBRATE 54 MG PO TABS: 54.0000 mg | ORAL_TABLET | Freq: Every day | ORAL | Status: DC

## 2015-09-24 MED ORDER — TRAMADOL-ACETAMINOPHEN 37.5-325 MG PO TABS: 1.0000 | ORAL_TABLET | ORAL | Status: DC | PRN

## 2015-09-24 MED ORDER — TAMSULOSIN HCL 0.4 MG PO CAPS: 0.4000 mg | ORAL_CAPSULE | Freq: Two times a day (BID) | ORAL | Status: DC

## 2015-09-24 MED ORDER — ALPRAZOLAM 0.5 MG PO TABS: 0.5000 mg | ORAL_TABLET | Freq: Three times a day (TID) | ORAL | Status: DC | PRN

## 2015-09-24 MED ORDER — OMEPRAZOLE 20 MG PO CPDR: 20.0000 mg | DELAYED_RELEASE_CAPSULE | Freq: Every day | ORAL | Status: DC

## 2015-09-24 MED ORDER — DUTASTERIDE 0.5 MG PO CAPS: 0.5000 mg | ORAL_CAPSULE | Freq: Every day | ORAL | Status: DC

## 2015-09-24 MED ORDER — SERTRALINE HCL 100 MG PO TABS: 100.0000 mg | ORAL_TABLET | Freq: Every day | ORAL | Status: DC

## 2015-09-24 NOTE — Telephone Encounter (Signed)
Please refill x 1 cpx is due

## 2015-09-24 NOTE — Addendum Note (Signed)
Addended by: Verlin Grills T on: 09/24/2015 11:31 AM   Modules accepted: Orders

## 2015-09-24 NOTE — Addendum Note (Signed)
Addended by: Verlin Grills T on: 09/24/2015 09:18 AM   Modules accepted: Orders

## 2015-09-26 ENCOUNTER — Encounter: Payer: Self-pay | Admitting: Endocrinology

## 2015-09-26 ENCOUNTER — Ambulatory Visit
Admission: RE | Admit: 2015-09-26 | Discharge: 2015-09-26 | Disposition: A | Discharge status: Home / Self Care | Payer: BC Managed Care – PPO | Source: Ambulatory Visit | Attending: Endocrinology | Admitting: Endocrinology

## 2015-09-26 ENCOUNTER — Ambulatory Visit (INDEPENDENT_AMBULATORY_CARE_PROVIDER_SITE_OTHER): Payer: BC Managed Care – PPO | Admitting: Endocrinology

## 2015-09-26 VITALS — BP 148/90 | HR 85 | Ht 69.75 in | Wt 201.4 lb

## 2015-09-26 DIAGNOSIS — R030 Elevated blood-pressure reading, without diagnosis of hypertension: Secondary | ICD-10-CM

## 2015-09-26 DIAGNOSIS — R0602 Shortness of breath: Secondary | ICD-10-CM

## 2015-09-26 DIAGNOSIS — R739 Hyperglycemia, unspecified: Secondary | ICD-10-CM

## 2015-09-26 DIAGNOSIS — Z0000 Encounter for general adult medical examination without abnormal findings: Secondary | ICD-10-CM

## 2015-09-26 DIAGNOSIS — Z0001 Encounter for general adult medical examination with abnormal findings: Secondary | ICD-10-CM

## 2015-09-26 DIAGNOSIS — Z125 Encounter for screening for malignant neoplasm of prostate: Secondary | ICD-10-CM | POA: Diagnosis not present

## 2015-09-26 HISTORY — DX: Encounter for general adult medical examination without abnormal findings: Z00.00

## 2015-09-26 LAB — LIPID PANEL
CHOL/HDL RATIO: 4
CHOLESTEROL: 178 mg/dL (ref 0–200)
HDL: 47.4 mg/dL (ref >39.00)
LDL CALC: 96 mg/dL (ref 0–99)
NonHDL: 130.26
TRIGLYCERIDES: 172 mg/dL — AB (ref 0.0–149.0)
VLDL: 34.4 mg/dL (ref 0.0–40.0)

## 2015-09-26 LAB — BASIC METABOLIC PANEL
BUN: 12 mg/dL (ref 6–23)
CALCIUM: 10 mg/dL (ref 8.4–10.5)
CO2: 24 mEq/L (ref 19–32)
CREATININE: 0.84 mg/dL (ref 0.40–1.50)
Chloride: 105 mEq/L (ref 96–112)
GFR: 99.5 mL/min (ref >60.00)
GLUCOSE: 144 mg/dL — AB (ref 70–99)
Potassium: 4.1 mEq/L (ref 3.5–5.1)
Sodium: 138 mEq/L (ref 135–145)

## 2015-09-26 LAB — CBC WITH DIFFERENTIAL/PLATELET
BASOS ABS: 0 10*3/uL (ref 0.0–0.1)
Basophils Relative: 0.3 % (ref 0.0–3.0)
Eosinophils Absolute: 0.2 10*3/uL (ref 0.0–0.7)
Eosinophils Relative: 1.8 % (ref 0.0–5.0)
HEMATOCRIT: 42.5 % (ref 39.0–52.0)
Hemoglobin: 14.4 g/dL (ref 13.0–17.0)
LYMPHS ABS: 2.8 10*3/uL (ref 0.7–4.0)
Lymphocytes Relative: 34 % (ref 12.0–46.0)
MCHC: 33.9 g/dL (ref 30.0–36.0)
MCV: 86.6 fl (ref 78.0–100.0)
Monocytes Absolute: 0.6 10*3/uL (ref 0.1–1.0)
Monocytes Relative: 7.8 % (ref 3.0–12.0)
NEUTROS ABS: 4.6 10*3/uL (ref 1.4–7.7)
NEUTROS PCT: 56.1 % (ref 43.0–77.0)
Platelets: 208 10*3/uL (ref 150.0–400.0)
RBC: 4.91 Mil/uL (ref 4.22–5.81)
RDW: 12.4 % (ref 11.5–15.5)
WBC: 8.3 10*3/uL (ref 4.0–10.5)

## 2015-09-26 LAB — URINALYSIS, ROUTINE W REFLEX MICROSCOPIC
Bilirubin Urine: NEGATIVE
Hgb urine dipstick: NEGATIVE
Ketones, ur: NEGATIVE
LEUKOCYTES UA: NEGATIVE
NITRITE: NEGATIVE
PH: 6.5 (ref 5.0–8.0)
Total Protein, Urine: NEGATIVE
Urine Glucose: NEGATIVE
Urobilinogen, UA: 0.2 (ref 0.0–1.0)
WBC, UA: NONE SEEN — AB (ref 0–?)

## 2015-09-26 LAB — HEPATIC FUNCTION PANEL
ALBUMIN: 4.9 g/dL (ref 3.5–5.2)
ALT: 46 U/L (ref 0–53)
AST: 28 U/L (ref 0–37)
Alkaline Phosphatase: 45 U/L (ref 39–117)
Bilirubin, Direct: 0.1 mg/dL (ref 0.0–0.3)
Total Bilirubin: 0.6 mg/dL (ref 0.2–1.2)
Total Protein: 7.4 g/dL (ref 6.0–8.3)

## 2015-09-26 LAB — PSA: PSA: 0.58 ng/mL (ref 0.10–4.00)

## 2015-09-26 LAB — TSH: TSH: 0.9 u[IU]/mL (ref 0.35–4.50)

## 2015-09-26 LAB — HEMOGLOBIN A1C: HEMOGLOBIN A1C: 6 % (ref 4.6–6.5)

## 2015-09-26 NOTE — Progress Notes (Signed)
Subjective:    Patient ID: Luis Peterson, male    DOB: 01-Apr-1956, 60 y.o.   MRN: [id number]  HPI Pt is here for regular wellness examination, and is feeling pretty well in general, and says chronic med probs are stable, except as noted below Past Medical History  Diagnosis Date  . ANXIETY 01/03/2007    Qualifier: Diagnosis of  By: Loanne Drilling MD, Jacelyn Pi   . 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 Consuella Lose  . DYSPNEA 09/01/2007    Qualifier: Diagnosis of  By: Loanne Drilling MD, Jacelyn Pi   . GERD 06/19/2008    Qualifier: Diagnosis of  By: Loanne Drilling MD, Jacelyn Pi   . HEARING LOSS 06/19/2008    Qualifier: Diagnosis of  By: Loanne Drilling MD, Jacelyn Pi   . HYPERCHOLESTEROLEMIA 09/01/2007    Qualifier: Diagnosis of  By: Loanne Drilling MD, Jacelyn Pi   . 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 Unspecified disorder of liver 06/19/2008    Qualifier: Diagnosis of  By: Loanne Drilling MD, Jone Baseman, SPINE 06/19/2008    Qualifier: Diagnosis of  By: Loanne Drilling MD, Jacelyn Pi Positive H. pylori test 07/1997  . Degenerative joint disease of cervical spine     Past Surgical History  Procedure Laterality Date  . Tympanoplasty  03/1996    Right  . Stress cardiolite  08/22/2003  . Electrocardiogram  05/28/2006    Social History   Social History  . Marital Status: Married    Spouse Name: N/A  . Number of Children: N/A  . Years of Education: N/A   Occupational History  . College Professor  [employer]   Social History Main Topics  . Smoking status: Former Research scientist (life sciences)  . Smokeless tobacco: Not on file  . Alcohol Use: Not on file  . Drug Use: Not on file  . Sexual Activity: Not on file   Other Topics Concern  . Not on file   Social History Narrative    Current Outpatient Prescriptions on File Prior to Visit  Medication  Sig Dispense Refill  . ALPRAZolam (XANAX) 0.5 MG tablet Take 1 tablet (0.5 mg total) by mouth 3 (three) times daily as needed. 90 tablet 0  . atorvastatin (LIPITOR) 10 MG tablet Take 1 tablet (10 mg total) by mouth daily. 90 tablet 1  . dutasteride (AVODART) 0.5 MG capsule Take 1 capsule (0.5 mg total) by mouth daily. 30 capsule 0  . fenofibrate 54 MG tablet Take 1 tablet (54 mg total) by mouth daily. 30 tablet 0  . Fluticasone-Salmeterol (ADVAIR) 100-50 MCG/DOSE AEPB Inhale 1 puff into the lungs every 12 (twelve) hours. 1 each 3  . omeprazole (PRILOSEC) 20 MG capsule Take 1 capsule (20 mg total) by mouth daily. 90 capsule 0  . sertraline (ZOLOFT) 100 MG tablet Take 1 tablet (100 mg total) by mouth daily. 30 tablet 0  . tamsulosin (FLOMAX) 0.4 MG CAPS capsule Take 1 capsule (0.4 mg total) by mouth 2 (two) times daily. 60 capsule 0  . traMADol-acetaminophen (ULTRACET) 37.5-325 MG tablet Take 1 tablet by mouth every 4 (four) hours as needed. 50 tablet 0  . zolpidem (AMBIEN CR) 12.5 MG CR tablet TAKE  1 TABLET BY MOUTH EVERY DAY AT BEDTIME AS NEEDED FOR SLEEP 30 tablet 1   No current facility-administered medications on file prior to visit.    Allergies  Allergen Reactions  . Simvastatin     REACTION: Rash    Family History  Problem Relation Age of Onset  . Cancer Father     Gastric cancer    BP 148/90 mmHg  Pulse 85  Ht 5' 9.75" (1.772 m)  Wt 201 lb 6.4 oz (91.354 kg)  BMI 29.09 kg/m2  SpO2 94%   Review of Systems  Constitutional: Negative for fever.  HENT: Negative for hearing loss.   Eyes: Negative for visual disturbance.  Respiratory: Negative for cough.   Cardiovascular: Negative for chest pain.  Gastrointestinal: Negative for anal bleeding.  Endocrine: Negative for cold intolerance.  Genitourinary: Negative for hematuria and difficulty urinating.  Musculoskeletal: Negative for back pain.  Skin: Negative for rash.  Allergic/Immunologic: Negative for environmental  allergies.  Hematological: Does not bruise/bleed easily.  Psychiatric/Behavioral: Positive for sleep disturbance.       Objective:   Physical Exam VS: see vs page GEN: no distress HEAD: head: no deformity eyes: no periorbital swelling, no proptosis external nose and ears are normal mouth: no lesion seen NECK: supple, thyroid is not enlarged CHEST WALL: no deformity LUNGS: clear to auscultation BREASTS:  No gynecomastia CV: reg rate and rhythm, no murmur ABD: abdomen is soft, nontender.  no hepatosplenomegaly.  not distended.  no hernia GENITALIA/RECTAL/PROSTATE: sees urology MUSCULOSKELETAL: muscle bulk and strength are grossly normal.  no obvious joint swelling.  gait is normal and steady EXTEMITIES: no deformity.  no ulcer on the feet.  feet are of normal color and temp.  no edema PULSES: dorsalis pedis intact bilat.  no carotid bruit NEURO:  cn 2-12 grossly intact.   readily moves all 4's.  sensation is intact to touch on the feet SKIN:  Normal texture and temperature.  No rash or suspicious lesion is visible.   NODES:  None palpable at the neck PSYCH: alert, well-oriented.  Does not appear anxious nor depressed.  i personally reviewed electrocardiogram tracing (today): Indication: elevated BP Impression: normal     Assessment & Plan:  Wellness visit today, with problems stable, except as noted.   Patient is advised the following: Patient Instructions  please consider these measures for your health:  minimize alcohol.  do not use tobacco products.  have a colonoscopy at least every 10 years from age 17.  Women should have an annual mammogram from age 30.  keep firearms safely stored.  always use seat belts.  have working smoke alarms in your home.  see an eye doctor and dentist regularly.  never drive under the influence of alcohol or drugs (including prescription drugs).  those with fair skin should take precautions against the sun.  blood tests are requested for you  today.  We'll let you know about the results.  Let's recheck the treadmill test.  you will receive a phone call, about a day and time for an appointment Please return in 1 year.

## 2015-09-26 NOTE — Patient Instructions (Addendum)
please consider these measures for your health:  minimize alcohol.  do not use tobacco products.  have a colonoscopy at least every 10 years from age 59.  Women should have an annual mammogram from age 44.  keep firearms safely stored.  always use seat belts.  have working smoke alarms in your home.  see an eye doctor and dentist regularly.  never drive under the influence of alcohol or drugs (including prescription drugs).  those with fair skin should take precautions against the sun.  blood tests are requested for you today.  We'll let you know about the results.  Let's recheck the treadmill test.  you will receive a phone call, about a day and time for an appointment Please return in 1 year.

## 2015-09-27 LAB — HIV ANTIBODY (ROUTINE TESTING W REFLEX): HIV: NONREACTIVE

## 2015-09-27 LAB — HEPATITIS C ANTIBODY: HCV Ab: NEGATIVE

## 2015-10-08 ENCOUNTER — Ambulatory Visit (INDEPENDENT_AMBULATORY_CARE_PROVIDER_SITE_OTHER): Payer: BC Managed Care – PPO

## 2015-10-08 DIAGNOSIS — R0602 Shortness of breath: Secondary | ICD-10-CM

## 2015-10-08 LAB — EXERCISE TOLERANCE TEST
CHL CUP RESTING HR STRESS: 86 {beats}/min
CHL CUP STRESS STAGE 1 DBP: 79 mmHg
CHL CUP STRESS STAGE 2 SPEED: 1 mph
CHL CUP STRESS STAGE 3 GRADE: 0.2 %
CHL CUP STRESS STAGE 3 SPEED: 1 mph
CHL CUP STRESS STAGE 4 DBP: 67 mmHg
CHL CUP STRESS STAGE 4 GRADE: 10 %
CHL CUP STRESS STAGE 4 SPEED: 1.7 mph
CHL CUP STRESS STAGE 5 HR: 157 {beats}/min
CHL CUP STRESS STAGE 5 SBP: 171 mmHg
CHL CUP STRESS STAGE 6 GRADE: 12 %
CHL CUP STRESS STAGE 6 SPEED: 2.5 mph
CHL CUP STRESS STAGE 7 DBP: 66 mmHg
CHL CUP STRESS STAGE 7 GRADE: 0 %
CHL CUP STRESS STAGE 8 GRADE: 0 %
CHL CUP STRESS STAGE 8 SPEED: 0 mph
CHL RATE OF PERCEIVED EXERTION: 15
CSEPED: 6 min
CSEPHR: 96 %
Estimated workload: 7 METS
Exercise duration (sec): 0 s
MPHR: 162 {beats}/min
Peak HR: 157 {beats}/min
Percent of predicted max HR: 96 %
Stage 1 Grade: 0 %
Stage 1 HR: 85 {beats}/min
Stage 1 SBP: 130 mmHg
Stage 1 Speed: 0 mph
Stage 2 Grade: 0 %
Stage 2 HR: 92 {beats}/min
Stage 3 HR: 93 {beats}/min
Stage 4 HR: 139 {beats}/min
Stage 4 SBP: 171 mmHg
Stage 5 DBP: 67 mmHg
Stage 5 Grade: 12 %
Stage 5 Speed: 2.5 mph
Stage 6 HR: 157 {beats}/min
Stage 7 HR: 141 {beats}/min
Stage 7 SBP: 138 mmHg
Stage 7 Speed: 0 mph
Stage 8 DBP: 74 mmHg
Stage 8 HR: 114 {beats}/min
Stage 8 SBP: 148 mmHg

## 2015-10-26 ENCOUNTER — Other Ambulatory Visit: Payer: Self-pay | Admitting: Endocrinology

## 2015-10-27 ENCOUNTER — Other Ambulatory Visit: Payer: Self-pay | Admitting: Endocrinology

## 2015-11-11 ENCOUNTER — Other Ambulatory Visit: Payer: Self-pay | Admitting: Endocrinology

## 2015-11-13 ENCOUNTER — Other Ambulatory Visit: Payer: Self-pay | Admitting: Endocrinology

## 2015-11-14 ENCOUNTER — Other Ambulatory Visit: Payer: Self-pay | Admitting: Endocrinology

## 2015-11-19 ENCOUNTER — Telehealth: Payer: Self-pay | Admitting: Endocrinology

## 2015-11-19 MED ORDER — TAMSULOSIN HCL 0.4 MG PO CAPS: 0.4000 mg | ORAL_CAPSULE | Freq: Two times a day (BID) | ORAL | Status: DC

## 2015-11-19 MED ORDER — ATORVASTATIN CALCIUM 10 MG PO TABS: 10.0000 mg | ORAL_TABLET | Freq: Every day | ORAL | Status: DC

## 2015-11-19 MED ORDER — SERTRALINE HCL 100 MG PO TABS: 100.0000 mg | ORAL_TABLET | Freq: Every day | ORAL | Status: DC

## 2015-11-19 MED ORDER — DUTASTERIDE 0.5 MG PO CAPS: 0.5000 mg | ORAL_CAPSULE | Freq: Every day | ORAL | Status: DC

## 2015-11-19 MED ORDER — FENOFIBRATE 54 MG PO TABS: 54.0000 mg | ORAL_TABLET | Freq: Every day | ORAL | Status: DC

## 2015-11-19 MED ORDER — OMEPRAZOLE 20 MG PO CPDR: 20.0000 mg | DELAYED_RELEASE_CAPSULE | Freq: Every day | ORAL | Status: DC

## 2015-11-19 NOTE — Telephone Encounter (Signed)
PT stated he requested 5 refills (he did not know specific names, stated he had sent them through [facility]) to be sent to [facility] on [facility], PT stated pharmacy has not received them.

## 2015-11-19 NOTE — Telephone Encounter (Signed)
All of the prescriptions Dr. Baldomero Lamy fills for this pt has been submitted to the pharmacy. Pt notified.

## 2015-12-12 ENCOUNTER — Other Ambulatory Visit: Payer: Self-pay | Admitting: Endocrinology

## 2015-12-13 ENCOUNTER — Telehealth: Payer: Self-pay

## 2015-12-13 ENCOUNTER — Other Ambulatory Visit: Payer: Self-pay | Admitting: Endocrinology

## 2015-12-13 ENCOUNTER — Other Ambulatory Visit: Payer: Self-pay

## 2015-12-13 MED ORDER — ALPRAZOLAM 0.5 MG PO TABS: 0.5000 mg | ORAL_TABLET | Freq: Three times a day (TID) | ORAL | 0 refills | Status: DC | PRN

## 2015-12-13 NOTE — Telephone Encounter (Signed)
Please ask if another dr can refill x 1 in my absence, thanks.

## 2015-12-13 NOTE — Telephone Encounter (Signed)
May refill 30 tablets only no refill

## 2015-12-13 NOTE — Telephone Encounter (Signed)
Okay to refill xanax 0.71m in Dr.Ellison absence?  Thank you!

## 2015-12-23 NOTE — Telephone Encounter (Signed)
Patient need a refill of medication, ALPRAZolam (XANAX) 0.5 MG tablet.  [facility] [place], [place] AT [facility] [address] [phone] (Phone) [phone] (Fax)

## 2015-12-24 ENCOUNTER — Other Ambulatory Visit: Payer: Self-pay | Admitting: Endocrinology

## 2015-12-24 MED ORDER — ALPRAZOLAM 0.5 MG PO TABS: 0.5000 mg | ORAL_TABLET | Freq: Three times a day (TID) | ORAL | 3 refills | Status: DC | PRN

## 2016-01-04 ENCOUNTER — Encounter: Payer: Self-pay | Admitting: Endocrinology

## 2016-01-10 ENCOUNTER — Other Ambulatory Visit: Payer: Self-pay | Admitting: Endocrinology

## 2016-01-13 ENCOUNTER — Other Ambulatory Visit: Payer: Self-pay | Admitting: Endocrinology

## 2016-01-14 MED ORDER — ZOLPIDEM TARTRATE ER 12.5 MG PO TBCR: 12.5000 mg | EXTENDED_RELEASE_TABLET | Freq: Every evening | ORAL | 5 refills | Status: DC | PRN

## 2016-01-20 NOTE — Telephone Encounter (Signed)
Hi Dawn  Could you review the request and let me know of any way to help the pt, it appears he wants to know whether a colonoscopy will be covered by his insurance and how much it would be  Thank you!

## 2016-01-25 ENCOUNTER — Other Ambulatory Visit: Payer: Self-pay | Admitting: Endocrinology

## 2016-01-27 ENCOUNTER — Other Ambulatory Visit: Payer: Self-pay | Admitting: Endocrinology

## 2016-01-29 DIAGNOSIS — Z0279 Encounter for issue of other medical certificate: Secondary | ICD-10-CM

## 2016-01-30 MED ORDER — TAMSULOSIN HCL 0.4 MG PO CAPS: 0.4000 mg | ORAL_CAPSULE | Freq: Every day | ORAL | 11 refills | Status: DC

## 2016-01-30 MED ORDER — SERTRALINE HCL 100 MG PO TABS: 100.0000 mg | ORAL_TABLET | Freq: Every day | ORAL | 0 refills | Status: DC

## 2016-01-30 NOTE — Telephone Encounter (Signed)
zolpiden ER PA received by [facility] # [id number] to complete clinic info to them for PA completion

## 2016-01-31 NOTE — Telephone Encounter (Signed)
PA for Zolpidem requires more information. Form placed on desk to review.

## 2016-01-31 NOTE — Telephone Encounter (Signed)
done

## 2016-02-02 ENCOUNTER — Encounter: Payer: Self-pay | Admitting: Endocrinology

## 2016-02-03 ENCOUNTER — Other Ambulatory Visit: Payer: Self-pay | Admitting: Endocrinology

## 2016-02-03 DIAGNOSIS — K625 Hemorrhage of anus and rectum: Secondary | ICD-10-CM

## 2016-02-03 HISTORY — DX: Hemorrhage of anus and rectum: K62.5

## 2016-02-06 ENCOUNTER — Encounter: Payer: Self-pay | Admitting: Gastroenterology

## 2016-02-10 ENCOUNTER — Other Ambulatory Visit: Payer: Self-pay | Admitting: Endocrinology

## 2016-02-11 MED ORDER — SERTRALINE HCL 100 MG PO TABS: 100.0000 mg | ORAL_TABLET | Freq: Every day | ORAL | 5 refills | Status: DC

## 2016-03-06 ENCOUNTER — Other Ambulatory Visit: Payer: Self-pay | Admitting: Endocrinology

## 2016-03-12 ENCOUNTER — Other Ambulatory Visit: Payer: Self-pay | Admitting: Endocrinology

## 2016-03-13 MED ORDER — ZOLPIDEM TARTRATE ER 12.5 MG PO TBCR: 12.5000 mg | EXTENDED_RELEASE_TABLET | Freq: Every evening | ORAL | 0 refills | Status: DC | PRN

## 2016-03-13 NOTE — Telephone Encounter (Signed)
done

## 2016-04-09 ENCOUNTER — Other Ambulatory Visit: Payer: Self-pay | Admitting: Endocrinology

## 2016-04-09 NOTE — Telephone Encounter (Signed)
This is for you

## 2016-04-10 NOTE — Telephone Encounter (Signed)
Please advise if ok to refill. Thanks 

## 2016-04-12 ENCOUNTER — Other Ambulatory Visit: Payer: Self-pay | Admitting: Endocrinology

## 2016-04-13 ENCOUNTER — Other Ambulatory Visit: Payer: Self-pay | Admitting: Endocrinology

## 2016-04-15 ENCOUNTER — Ambulatory Visit: Payer: BC Managed Care – PPO | Admitting: Gastroenterology

## 2016-05-06 ENCOUNTER — Other Ambulatory Visit: Payer: Self-pay | Admitting: Endocrinology

## 2016-05-07 MED ORDER — ZOLPIDEM TARTRATE ER 12.5 MG PO TBCR: 12.5000 mg | EXTENDED_RELEASE_TABLET | Freq: Every evening | ORAL | 1 refills | Status: DC | PRN

## 2016-05-17 ENCOUNTER — Other Ambulatory Visit: Payer: Self-pay | Admitting: Endocrinology

## 2016-05-18 ENCOUNTER — Other Ambulatory Visit: Payer: Self-pay | Admitting: Endocrinology

## 2016-05-22 ENCOUNTER — Other Ambulatory Visit: Payer: Self-pay | Admitting: Endocrinology

## 2016-05-25 ENCOUNTER — Other Ambulatory Visit: Payer: Self-pay | Admitting: Endocrinology

## 2016-05-26 MED ORDER — FENOFIBRATE 54 MG PO TABS: 54.0000 mg | ORAL_TABLET | Freq: Every day | ORAL | 2 refills | Status: DC

## 2016-05-26 MED ORDER — ATORVASTATIN CALCIUM 10 MG PO TABS
ORAL_TABLET | ORAL | 0 refills | Status: DC
Start: 2016-05-26 — End: 2016-06-09

## 2016-05-28 ENCOUNTER — Other Ambulatory Visit: Payer: Self-pay | Admitting: Endocrinology

## 2016-05-29 MED ORDER — ALPRAZOLAM 0.5 MG PO TABS: 0.5000 mg | ORAL_TABLET | Freq: Three times a day (TID) | ORAL | 0 refills | Status: DC | PRN

## 2016-05-29 NOTE — Addendum Note (Signed)
Addended by: Renato Shin on: 05/29/2016 11:49 AM   Modules accepted: Orders

## 2016-05-29 NOTE — Telephone Encounter (Signed)
Was refilled last week

## 2016-06-01 ENCOUNTER — Encounter: Payer: Self-pay | Admitting: Gastroenterology

## 2016-06-01 ENCOUNTER — Ambulatory Visit (INDEPENDENT_AMBULATORY_CARE_PROVIDER_SITE_OTHER): Payer: BC Managed Care – PPO | Admitting: Gastroenterology

## 2016-06-01 VITALS — BP 110/70 | HR 72 | Ht 70.0 in | Wt 207.0 lb

## 2016-06-01 DIAGNOSIS — K625 Hemorrhage of anus and rectum: Secondary | ICD-10-CM

## 2016-06-01 DIAGNOSIS — K594 Anal spasm: Secondary | ICD-10-CM

## 2016-06-01 DIAGNOSIS — Z800 Family history of malignant neoplasm of digestive organs: Secondary | ICD-10-CM

## 2016-06-01 MED ORDER — NA SULFATE-K SULFATE-MG SULF 17.5-3.13-1.6 GM/177ML PO SOLN: 1.0000 | Freq: Once | ORAL | 0 refills | Status: AC

## 2016-06-01 NOTE — Progress Notes (Signed)
[facility] Consult Note:  History: Luis Peterson 06/01/2016  Referring physician: Renato Shin, MD  Reason for consult/chief complaint: Blood In Stools (onset several months, sister age 60 dx stage 60 colon cancer, father had intestal cancer)   Subjective  HPI:  For last several months has seen blood on paper or in toilet bowl.  Occ'l constipation Denies abd pain, but sometimes acute onset rectal pain might awaken him at night. Denies anal pain with BMs Denies UGI symptoms Sister, age 68, recently Dx with Stage 4 colon cancer and just finished CTX. Father died age 38 from CVA but also had "intestinal cancer".   ROS:  Review of Systems  Constitutional: Negative for appetite change and unexpected weight change.  HENT: Negative for mouth sores and voice change.   Eyes: Negative for pain and redness.  Respiratory: Negative for cough and shortness of breath.   Cardiovascular: Negative for chest pain and palpitations.  Genitourinary: Negative for dysuria and hematuria.  Musculoskeletal: Negative for arthralgias and myalgias.  Skin: Negative for pallor and rash.  Neurological: Negative for weakness and headaches.  Hematological: Negative for adenopathy.  Psychiatric/Behavioral: The patient is nervous/anxious.      Past Medical History: Past Medical History:  Diagnosis Date  . ANXIETY 01/03/2007   Qualifier: Diagnosis of  By: Loanne Drilling MD, Jacelyn Pi   . 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 Consuella Lose  . Degenerative joint disease of cervical spine   . DYSPNEA 09/01/2007   Qualifier: Diagnosis of  By: Loanne Drilling MD, Jacelyn Pi   . GERD 06/19/2008   Qualifier: Diagnosis of  By: Loanne Drilling MD, Jacelyn Pi   . HEARING LOSS 06/19/2008   Qualifier: Diagnosis of  By: Loanne Drilling MD, Jacelyn Pi   . HYPERCHOLESTEROLEMIA 09/01/2007   Qualifier: Diagnosis of  By: Loanne Drilling MD, Jacelyn Pi   . HYPERGLYCEMIA 09/01/2007   Qualifier: Diagnosis of  By: Loanne Drilling MD, Jacelyn Pi INSOMNIA  06/19/2008   Qualifier: Diagnosis of  By: Loanne Drilling MD, Jone Baseman, SPINE 06/19/2008   Qualifier: Diagnosis of  By: Loanne Drilling MD, Jacelyn Pi Positive H. pylori test 07/1997  . 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 Unspecified disorder of liver 06/19/2008   Qualifier: Diagnosis of  By: Loanne Drilling MD, Jacelyn Pi      Past Surgical History: Past Surgical History:  Procedure Laterality Date  . COLONOSCOPY    . ELECTROCARDIOGRAM  05/28/2006  . Stress Cardiolite  08/22/2003  . TYMPANOPLASTY  03/1996   Right     Family History: Family History  Problem Relation Age of Onset  . Gastric cancer Father     died age 24  . Stroke Father   . Heart disease Mother   . Colon cancer Sister 65  . Other Brother     prostate issues, x 2 brothers    Social History: Social History   Social History  . Marital status: Married    Spouse name: N/A  . Number of children: 1  . Years of education: N/A   Occupational History  . College Professor  [employer]   Social History Main Topics  . Smoking status: Former Smoker    Years: 5.00    Types: Cigarettes  . Smokeless tobacco: Never Used  . Alcohol use Yes     Comment: social  . Drug use: No  . Sexual activity: Not Asked  Other Topics Concern  . None   Social History Narrative  . None   Originally from [place], work as Engineer, technical sales professor at [employer]: Allergies  Allergen Reactions  . Simvastatin     REACTION: Rash    Outpatient Meds: Current Outpatient Prescriptions  Medication Sig Dispense Refill  . ALPRAZolam (XANAX) 0.5 MG tablet Take 1 tablet (0.5 mg total) by mouth 3 (three) times daily as needed. 90 tablet 0  . atorvastatin (LIPITOR) 10 MG tablet Take 1 tab daily 90 tablet 0  . dutasteride (AVODART) 0.5 MG capsule Take 1 capsule (0.5 mg total) by mouth daily. 30 capsule 5  . fenofibrate 54 MG tablet Take 1 tablet (54 mg total) by mouth daily. 30 tablet 2  . Fluticasone-Salmeterol (ADVAIR)  100-50 MCG/DOSE AEPB Inhale 1 puff into the lungs every 12 (twelve) hours. 1 each 3  . omeprazole (PRILOSEC) 20 MG capsule Take 1 capsule (20 mg total) by mouth daily. 90 capsule 1  . sertraline (ZOLOFT) 100 MG tablet Take 1 tablet (100 mg total) by mouth daily. 30 tablet 5  . tamsulosin (FLOMAX) 0.4 MG CAPS capsule Take 0.4 mg by mouth 2 (two) times daily.    Marland Kitchen zolpidem (AMBIEN CR) 12.5 MG CR tablet Take 1 tablet (12.5 mg total) by mouth at bedtime as needed. for sleep 30 tablet 1   No current facility-administered medications for this visit.       ___________________________________________________________________ Objective   Exam:  BP 110/70   Pulse 72   Ht 5' 10"  (1.778 m)   Wt 207 lb (93.9 kg)   BMI 29.70 kg/m    General: this is a(n) well appearing man, good muscle mass   Eyes: sclera anicteric, no redness  ENT: oral mucosa moist without lesions, no cervical or supraclavicular lymphadenopathy, good dentition  CV: RRR without murmur, S1/S2, no JVD, no peripheral edema  Resp: clear to auscultation bilaterally, normal RR and effort noted  GI: soft, no tenderness, with active bowel sounds. No guarding or palpable organomegaly noted.  Skin; warm and dry, no rash or jaundice noted  Neuro: awake, alert and oriented x 3. Normal gross motor function and fluent speech Rectal: normal external, NST, nml internal, nontender, no fissure  Labs:  CBC Latest Ref Rng & Units 09/26/2015 09/03/2014 08/24/2013  WBC 4.0 - 10.5 K/uL 8.3 7.2 8.1  Hemoglobin 13.0 - 17.0 g/dL 14.4 13.8 14.9  Hematocrit 39.0 - 52.0 % 42.5 40.5 44.6  Platelets 150.0 - 400.0 K/uL 208.0 192.0 206.0    Assessment: Encounter Diagnoses  Name Primary?  . Rectal bleeding Yes  . Family history of colon cancer   . Proctalgia fugax     Seems most likely to be benign anal bleeding  Plan:  Colonoscopy.  He is agreeable.  The benefits and risks of the planned procedure were described in detail with the  patient or (when appropriate) their health care proxy.  Risks were outlined as including, but not limited to, bleeding, infection, perforation, adverse medication reaction leading to cardiac or pulmonary decompensation, or pancreatitis (if ERCP).  The limitation of incomplete mucosal visualization was also discussed.  No guarantees or warranties were given.   Thank you for the courtesy of this consult.  Please call me with any questions or concerns.  [name]  CC: Renato Shin, MD

## 2016-06-01 NOTE — Patient Instructions (Signed)
If you are age 60 or older, your body mass index should be between 23-30. Your Body mass index is 29.7 kg/m. If this is out of the aforementioned range listed, please consider follow up with your Primary Care Provider.  If you are age 42 or younger, your body mass index should be between 19-25. Your Body mass index is 29.7 kg/m. If this is out of the aformentioned range listed, please consider follow up with your Primary Care Provider.   You have been scheduled for a colonoscopy. Please follow written instructions given to you at your visit today.  Please pick up your prep supplies at the pharmacy within the next 1-3 days. If you use inhalers (even only as needed), please bring them with you on the day of your procedure. Your physician has requested that you go to www.startemmi.com and enter the access code given to you at your visit today. This web site gives a general overview about your procedure. However, you should still follow specific instructions given to you by our office regarding your preparation for the procedure.  Thank you for choosing [facility]  Dr Wilfrid Lund III

## 2016-06-07 ENCOUNTER — Other Ambulatory Visit: Payer: Self-pay | Admitting: Endocrinology

## 2016-06-09 ENCOUNTER — Other Ambulatory Visit: Payer: Self-pay | Admitting: Endocrinology

## 2016-06-10 MED ORDER — ATORVASTATIN CALCIUM 10 MG PO TABS: ORAL_TABLET | ORAL | 0 refills | Status: DC

## 2016-06-12 ENCOUNTER — Encounter: Payer: BC Managed Care – PPO | Admitting: Gastroenterology

## 2016-06-20 ENCOUNTER — Other Ambulatory Visit: Payer: Self-pay | Admitting: Endocrinology

## 2016-06-23 ENCOUNTER — Encounter: Payer: Self-pay | Admitting: Gastroenterology

## 2016-06-23 ENCOUNTER — Ambulatory Visit (AMBULATORY_SURGERY_CENTER): Payer: BC Managed Care – PPO | Admitting: Gastroenterology

## 2016-06-23 VITALS — BP 107/61 | HR 61 | Temp 94.4°F | Resp 12 | Ht 70.0 in | Wt 207.0 lb

## 2016-06-23 DIAGNOSIS — K640 First degree hemorrhoids: Secondary | ICD-10-CM

## 2016-06-23 DIAGNOSIS — K625 Hemorrhage of anus and rectum: Secondary | ICD-10-CM

## 2016-06-23 DIAGNOSIS — D122 Benign neoplasm of ascending colon: Secondary | ICD-10-CM | POA: Diagnosis not present

## 2016-06-23 DIAGNOSIS — Z800 Family history of malignant neoplasm of digestive organs: Secondary | ICD-10-CM

## 2016-06-23 MED ORDER — SODIUM CHLORIDE 0.9 % IV SOLN
500.0000 mL | INTRAVENOUS | Status: DC
Start: 2016-06-23 — End: 2018-04-15

## 2016-06-23 NOTE — Op Note (Signed)
[place] Patient Name: Luis Peterson Procedure Date: 06/23/2016 3:29 PM MRN: [id number] Endoscopist: Chapman. Loletha Carrow , MD Age: 61 Referring MD:  Date of Birth: 1956-03-27 Gender: Male Account #: [id number] Procedure:                Colonoscopy Indications:              Rectal bleeding, Rectal pain Medicines:                Monitored Anesthesia Care Procedure:                Pre-Anesthesia Assessment:                           - Prior to the procedure, a History and Physical                            was performed, and patient medications and                            allergies were reviewed. The patient's tolerance of                            previous anesthesia was also reviewed. The risks                            and benefits of the procedure and the sedation                            options and risks were discussed with the patient.                            All questions were answered, and informed consent                            was obtained. Prior Anticoagulants: The patient has                            taken no previous anticoagulant or antiplatelet                            agents. ASA Grade Assessment: II - A patient with                            mild systemic disease. After reviewing the risks                            and benefits, the patient was deemed in                            satisfactory condition to undergo the procedure.                           After obtaining informed consent, the colonoscope  was passed under direct vision. Throughout the                            procedure, the patient's blood pressure, pulse, and                            oxygen saturations were monitored continuously. The                            Model CF-HQ190L [id number]) scope was introduced                            through the anus and advanced to the the cecum,                            identified by appendiceal orifice  and ileocecal                            valve. The colonoscopy was performed without                            difficulty. The patient tolerated the procedure                            well. The quality of the bowel preparation was                            good. The ileocecal valve, appendiceal orifice, and                            rectum were photographed. The quality of the bowel                            preparation was evaluated using the BBPS [facility]                            Bowel Preparation Scale) with scores of: Right                            Colon = 2, Transverse Colon = 2 and Left Colon = 2.                            The total BBPS score equals 6. The bowel                            preparation used was SUPREP. Scope In: 3:44:01 PM Scope Out: 3:54:59 PM Scope Withdrawal Time: 0 hours 8 minutes 29 seconds  Total Procedure Duration: 0 hours 10 minutes 58 seconds  Findings:                 The perianal and digital rectal examinations were                            normal.  A 4 mm polyp was found in the ascending colon. The                            polyp was sessile. The polyp was removed with a                            cold snare. Resection and retrieval were complete.                           A few small-mouthed diverticula were found in the                            sigmoid colon.                           Internal hemorrhoids were found during retroflexion                            and during anoscopy. The hemorrhoids were                            medium-sized and Grade I (internal hemorrhoids that                            do not prolapse).                           The exam was otherwise without abnormality on                            direct and retroflexion views. Complications:            No immediate complications. Estimated Blood Loss:     Estimated blood loss: none. Impression:               - One 4 mm polyp in the  ascending colon, removed                            with a cold snare. Resected and retrieved.                           - Diverticulosis in the sigmoid colon.                           - Internal hemorrhoids, which are the only visible                            source of bleeding.                           - The examination was otherwise normal on direct                            and retroflexion views.  The intermitent rectal pain appears to be                            proctalgia fugax, and might respond to the                            patient's alprazolam. Recommendation:           - Patient has a contact number available for                            emergencies. The signs and symptoms of potential                            delayed complications were discussed with the                            patient. Return to normal activities tomorrow.                            Written discharge instructions were provided to the                            patient.                           - Resume previous diet.                           - Continue present medications.                           - Await pathology results.                           - Repeat colonoscopy in 5 years for surveillance,                            even if polyp hyperplastic (due to family history                            of colon cancer in sister)                           - As needed use of over-the-counter preparation H                            suppository for hemorrhoidal bleeding. Prarthana Parlin L. Loletha Carrow, MD 06/23/2016 4:03:25 PM This report has been signed electronically.

## 2016-06-23 NOTE — Patient Instructions (Signed)
YOU HAD AN ENDOSCOPIC PROCEDURE TODAY AT [facility]:   Refer to the procedure report that was given to you for any specific questions about what was found during the examination.  If the procedure report does not answer your questions, please call your gastroenterologist to clarify.  If you requested that your care partner not be given the details of your procedure findings, then the procedure report has been included in a sealed envelope for you to review at your convenience later.  YOU SHOULD EXPECT: Some feelings of bloating in the abdomen. Passage of more gas than usual.  Walking can help get rid of the air that was put into your GI tract during the procedure and reduce the bloating. If you had a lower endoscopy (such as a colonoscopy or flexible sigmoidoscopy) you may notice spotting of blood in your stool or on the toilet paper. If you underwent a bowel prep for your procedure, you may not have a normal bowel movement for a few days.  Please Note:  You might notice some irritation and congestion in your nose or some drainage.  This is from the oxygen used during your procedure.  There is no need for concern and it should clear up in a day or so.  SYMPTOMS TO REPORT IMMEDIATELY:   Following lower endoscopy (colonoscopy or flexible sigmoidoscopy):  Excessive amounts of blood in the stool  Significant tenderness or worsening of abdominal pains  Swelling of the abdomen that is new, acute  Fever of 100F or higher    For urgent or emergent issues, a gastroenterologist can be reached at any hour by calling [phone].   DIET:  We do recommend a small meal at first, but then you may proceed to your regular diet.  Drink plenty of fluids but you should avoid alcoholic beverages for 24 hours.  ACTIVITY:  You should plan to take it easy for the rest of today and you should NOT DRIVE or use heavy machinery until tomorrow (because of the sedation medicines used during the test).     FOLLOW UP: Our staff will call the number listed on your records the next business day following your procedure to check on you and address any questions or concerns that you may have regarding the information given to you following your procedure. If we do not reach you, we will leave a message.  However, if you are feeling well and you are not experiencing any problems, there is no need to return our call.  We will assume that you have returned to your regular daily activities without incident.  If any biopsies were taken you will be contacted by phone or by letter within the next 1-3 weeks.  Please call us at [phone] if you have not heard about the biopsies in 3 weeks.    SIGNATURES/CONFIDENTIALITY: You and/or your care partner have signed paperwork which will be entered into your electronic medical record.  These signatures attest to the fact that that the information above on your After Visit Summary has been reviewed and is understood.  Full responsibility of the confidentiality of this discharge information lies with you and/or your care-partner.   Resume medications. Information given on polyps,diverticulosis,hemorrhoids and high fiber diet.

## 2016-06-23 NOTE — Progress Notes (Signed)
Called to room to assist during endoscopic procedure.  Patient ID and intended procedure confirmed with present staff. Received instructions for my participation in the procedure from the performing physician.  

## 2016-06-23 NOTE — Progress Notes (Signed)
Report given to PACU, vss 

## 2016-06-24 ENCOUNTER — Telehealth: Payer: Self-pay

## 2016-06-24 NOTE — Telephone Encounter (Signed)
  Follow up Call-  Call back number 06/23/2016  Post procedure Call Back phone  # [phone]  Permission to leave phone message Yes  Some recent data might be hidden     Patient questions:  Do you have a fever, pain , or abdominal swelling? No. Pain Score  0 *  Have you tolerated food without any problems? Yes.    Have you been able to return to your normal activities? Yes.    Do you have any questions about your discharge instructions: Diet   No. Medications  No. Follow up visit  No.  Do you have questions or concerns about your Care? No.  Actions: * If pain score is 4 or above: No action needed, pain <4.

## 2016-06-26 ENCOUNTER — Encounter: Payer: Self-pay | Admitting: Gastroenterology

## 2016-06-29 ENCOUNTER — Other Ambulatory Visit: Payer: Self-pay | Admitting: Endocrinology

## 2016-06-29 MED ORDER — ZOLPIDEM TARTRATE ER 12.5 MG PO TBCR: 12.5000 mg | EXTENDED_RELEASE_TABLET | Freq: Every evening | ORAL | 0 refills | Status: DC | PRN

## 2016-06-29 MED ORDER — ALPRAZOLAM 0.5 MG PO TABS: 0.5000 mg | ORAL_TABLET | Freq: Three times a day (TID) | ORAL | 0 refills | Status: DC | PRN

## 2016-06-29 MED ORDER — ATORVASTATIN CALCIUM 10 MG PO TABS: ORAL_TABLET | ORAL | 0 refills | Status: DC

## 2016-06-29 MED ORDER — DUTASTERIDE 0.5 MG PO CAPS: 0.5000 mg | ORAL_CAPSULE | Freq: Every day | ORAL | 0 refills | Status: DC

## 2016-07-15 ENCOUNTER — Other Ambulatory Visit: Payer: Self-pay | Admitting: Endocrinology

## 2016-07-15 ENCOUNTER — Other Ambulatory Visit: Payer: Self-pay | Admitting: Internal Medicine

## 2016-07-15 MED ORDER — ALPRAZOLAM 0.5 MG PO TABS: 0.5000 mg | ORAL_TABLET | Freq: Three times a day (TID) | ORAL | 5 refills | Status: DC | PRN

## 2016-07-15 MED ORDER — ATORVASTATIN CALCIUM 10 MG PO TABS: ORAL_TABLET | ORAL | 0 refills | Status: DC

## 2016-07-15 MED ORDER — ZOLPIDEM TARTRATE ER 12.5 MG PO TBCR: 12.5000 mg | EXTENDED_RELEASE_TABLET | Freq: Every evening | ORAL | 5 refills | Status: DC | PRN

## 2016-07-15 MED ORDER — DUTASTERIDE 0.5 MG PO CAPS: 0.5000 mg | ORAL_CAPSULE | Freq: Every day | ORAL | 0 refills | Status: DC

## 2016-07-24 ENCOUNTER — Other Ambulatory Visit: Payer: Self-pay | Admitting: Endocrinology

## 2016-07-25 ENCOUNTER — Other Ambulatory Visit: Payer: Self-pay | Admitting: Endocrinology

## 2016-08-08 ENCOUNTER — Other Ambulatory Visit: Payer: Self-pay | Admitting: Endocrinology

## 2016-08-10 MED ORDER — ATORVASTATIN CALCIUM 10 MG PO TABS: ORAL_TABLET | ORAL | 0 refills | Status: DC

## 2016-08-10 MED ORDER — DUTASTERIDE 0.5 MG PO CAPS: 0.5000 mg | ORAL_CAPSULE | Freq: Every day | ORAL | 0 refills | Status: DC

## 2016-08-10 MED ORDER — ALPRAZOLAM 0.5 MG PO TABS: 0.5000 mg | ORAL_TABLET | Freq: Three times a day (TID) | ORAL | 0 refills | Status: DC | PRN

## 2016-08-10 MED ORDER — FENOFIBRATE 54 MG PO TABS: 54.0000 mg | ORAL_TABLET | Freq: Every day | ORAL | 0 refills | Status: DC

## 2016-08-11 ENCOUNTER — Ambulatory Visit (INDEPENDENT_AMBULATORY_CARE_PROVIDER_SITE_OTHER): Payer: BC Managed Care – PPO | Admitting: Family Medicine

## 2016-08-11 ENCOUNTER — Encounter: Payer: Self-pay | Admitting: Family Medicine

## 2016-08-11 VITALS — BP 116/78 | HR 94 | Ht 70.0 in | Wt 195.0 lb

## 2016-08-11 DIAGNOSIS — M25511 Pain in right shoulder: Secondary | ICD-10-CM

## 2016-08-11 DIAGNOSIS — M25512 Pain in left shoulder: Secondary | ICD-10-CM

## 2016-08-11 MED ORDER — METHYLPREDNISOLONE ACETATE 40 MG/ML IJ SUSP
40.0000 mg | Freq: Once | INTRAMUSCULAR | Status: AC
  Administered 2016-08-11: 40 mg

## 2016-08-11 NOTE — Patient Instructions (Signed)
You have rotator cuff impingement Try to avoid painful activities (overhead activities, lifting with extended arm) as much as possible. Consider Aleve 2 tabs twice a day with food OR ibuprofen 3 tabs three times a day with food for pain and inflammation. Can take tylenol in addition to this. Subacromial injection may be beneficial to help with pain and to decrease inflammation - you were given this into the right shoulder today. Consider physical therapy with transition to home exercise program. Do home exercise program with theraband and scapular stabilization exercises daily - these are very important for long term relief even if an injection was given.  3 sets of 10 once a day. If not improving at follow-up we will consider further imaging, left shoulder injection, physical therapy, and/or nitro patches. Follow up with me in 6 weeks for reevaluation.

## 2016-08-13 DIAGNOSIS — M25511 Pain in right shoulder: Secondary | ICD-10-CM

## 2016-08-13 DIAGNOSIS — M25512 Pain in left shoulder: Secondary | ICD-10-CM

## 2016-08-13 HISTORY — DX: Pain in right shoulder: M25.511

## 2016-08-13 HISTORY — DX: Pain in left shoulder: M25.512

## 2016-08-13 NOTE — Assessment & Plan Note (Signed)
2/2 rotator cuff impingement.  Shown home exercises to do daily.  Subacromial injection given on right side.  Aleve or ibuprofen if needed.  Consider further imaging, left subacromial injection, PT, nitro patches if not improving.  F/u in 6 weeks.  After informed written consent, patient was seated on exam table. Right shoulder was prepped with alcohol swab and utilizing posterior approach, patient's right subacromial space was injected with 3:1 bupivicaine: depomedrol. Patient tolerated the procedure well without immediate complications.

## 2016-08-13 NOTE — Progress Notes (Signed)
PCP: Renato Shin, MD  Subjective:   HPI: Patient is a 61 y.o. male here for bilateral shoulder pain.  Patient reports for about 1 month he's had bilateral shoulder pain. Right side worse than left. No acute injury or trauma. He plays racquetball but hasn't been able to in past 2 months. Sleeps on right side and unable to do so. Worse with any movements of shoulder. Not taking any medications or using ice/heat. Pain is 0/10 at rest, up to 8/10 and sharp at worst. No skin changes, numbness.  Past Medical History:  Diagnosis Date  . ANXIETY 01/03/2007   Qualifier: Diagnosis of  By: Loanne Drilling MD, Jacelyn Pi   . 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 Consuella Lose  . Degenerative joint disease of cervical spine   . DYSPNEA 09/01/2007   Qualifier: Diagnosis of  By: Loanne Drilling MD, Jacelyn Pi   . GERD 06/19/2008   Qualifier: Diagnosis of  By: Loanne Drilling MD, Jacelyn Pi   . HEARING LOSS 06/19/2008   Qualifier: Diagnosis of  By: Loanne Drilling MD, Jacelyn Pi   . HYPERCHOLESTEROLEMIA 09/01/2007   Qualifier: Diagnosis of  By: Loanne Drilling MD, Jacelyn Pi   . HYPERGLYCEMIA 09/01/2007   Qualifier: Diagnosis of  By: Loanne Drilling MD, Jacelyn Pi INSOMNIA 06/19/2008   Qualifier: Diagnosis of  By: Loanne Drilling MD, Jone Baseman, SPINE 06/19/2008   Qualifier: Diagnosis of  By: Loanne Drilling MD, Jacelyn Pi Positive H. pylori test 07/1997  . 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 Unspecified disorder of liver 06/19/2008   Qualifier: Diagnosis of  By: Loanne Drilling MD, Jacelyn Pi     Current Outpatient Prescriptions on File Prior to Visit  Medication Sig Dispense Refill  . ALPRAZolam (XANAX) 0.5 MG tablet Take 1 tablet (0.5 mg total) by mouth 3 (three) times daily as needed. 90 tablet 0  . atorvastatin (LIPITOR) 10 MG tablet Take 1 tab daily 90 tablet 0  . dutasteride (AVODART) 0.5 MG capsule Take 1 capsule (0.5 mg total) by mouth daily. 30 capsule 0  . fenofibrate 54 MG tablet Take 1 tablet (54 mg total)  by mouth daily. 30 tablet 0  . Fluticasone-Salmeterol (ADVAIR) 100-50 MCG/DOSE AEPB Inhale 1 puff into the lungs every 12 (twelve) hours. (Patient not taking: Reported on 06/23/2016) 1 each 3  . omeprazole (PRILOSEC) 20 MG capsule Take 1 capsule (20 mg total) by mouth daily. (Patient not taking: Reported on 06/23/2016) 90 capsule 1  . sertraline (ZOLOFT) 100 MG tablet Take 1 tablet (100 mg total) by mouth daily. 30 tablet 5  . tamsulosin (FLOMAX) 0.4 MG CAPS capsule Take 0.4 mg by mouth 2 (two) times daily.    Marland Kitchen zolpidem (AMBIEN CR) 12.5 MG CR tablet Take 1 tablet (12.5 mg total) by mouth at bedtime as needed. for sleep 30 tablet 5   Current Facility-Administered Medications on File Prior to Visit  Medication Dose Route Frequency Provider Last Rate Last Dose  . 0.9 %  sodium chloride infusion  500 mL Intravenous Continuous Doran Stabler, MD        Past Surgical History:  Procedure Laterality Date  . COLONOSCOPY    . ELECTROCARDIOGRAM  05/28/2006  . Stress Cardiolite  08/22/2003  . TYMPANOPLASTY  03/1996   Right    Allergies  Allergen Reactions  . Simvastatin     REACTION: Rash    Social  History   Social History  . Marital status: Married    Spouse name: N/A  . Number of children: 1  . Years of education: N/A   Occupational History  . College Professor  [employer]   Social History Main Topics  . Smoking status: Former Smoker    Years: 5.00    Types: Cigarettes  . Smokeless tobacco: Never Used  . Alcohol use Yes     Comment: social  . Drug use: No  . Sexual activity: Not on file   Other Topics Concern  . Not on file   Social History Narrative  . No narrative on file    Family History  Problem Relation Age of Onset  . Gastric cancer Father     died age 20  . Stroke Father   . Heart disease Mother   . Colon cancer Sister 28  . Other Brother     prostate issues, x 2 brothers    BP 116/78   Pulse 94   Ht 5' 10"  (1.778 m)   Wt 195 lb (88.5 kg)   BMI 27.98  kg/m   Review of Systems: See HPI above.     Objective:  Physical Exam:  Gen: NAD, comfortable in exam room  Right shoulder: No swelling, ecchymoses.  No gross deformity. No TTP. FROM with painful arc. Positive Hawkins, Neers. Negative Speeds, Yergasons. Strength 5/5 with empty can and resisted internal/external rotation.  Pain empty can. Negative apprehension. NV intact distally.  Left shoulder: No swelling, ecchymoses.  No gross deformity. No TTP. FROM. Negative Hawkins, positive Neers. Negative Speeds, Yergasons. Strength 5/5 with empty can and resisted internal/external rotation.  Mild pain empty can Negative apprehension. NV intact distally.   Assessment & Plan:  1. Bilateral shoulder pain - 2/2 rotator cuff impingement.  Shown home exercises to do daily.  Subacromial injection given on right side.  Aleve or ibuprofen if needed.  Consider further imaging, left subacromial injection, PT, nitro patches if not improving.  F/u in 6 weeks.  After informed written consent, patient was seated on exam table. Right shoulder was prepped with alcohol swab and utilizing posterior approach, patient's right subacromial space was injected with 3:1 bupivicaine: depomedrol. Patient tolerated the procedure well without immediate complications.

## 2016-08-20 ENCOUNTER — Other Ambulatory Visit: Payer: Self-pay | Admitting: Endocrinology

## 2016-09-18 ENCOUNTER — Other Ambulatory Visit: Payer: Self-pay | Admitting: Endocrinology

## 2016-09-18 ENCOUNTER — Other Ambulatory Visit: Payer: Self-pay

## 2016-09-22 ENCOUNTER — Other Ambulatory Visit: Payer: Self-pay | Admitting: Endocrinology

## 2016-09-22 ENCOUNTER — Encounter: Payer: Self-pay | Admitting: Family Medicine

## 2016-09-22 ENCOUNTER — Ambulatory Visit (INDEPENDENT_AMBULATORY_CARE_PROVIDER_SITE_OTHER): Payer: BC Managed Care – PPO | Admitting: Family Medicine

## 2016-09-22 DIAGNOSIS — M25512 Pain in left shoulder: Secondary | ICD-10-CM | POA: Diagnosis not present

## 2016-09-22 DIAGNOSIS — M25511 Pain in right shoulder: Secondary | ICD-10-CM

## 2016-09-22 MED ORDER — NITROGLYCERIN 0.2 MG/HR TD PT24: MEDICATED_PATCH | TRANSDERMAL | 1 refills | Status: DC

## 2016-09-22 NOTE — Patient Instructions (Addendum)
You have rotator cuff impingement Try to avoid painful activities (overhead activities, lifting with extended arm) as much as possible. Start nitro patches - 1/4th patch to right shoulder, change daily.  After about a week ok to apply to left shoulder as well. Start physical therapy with transition to home exercise program. Do home exercises on days you don't go to therapy. If not improving at follow-up we will consider further imaging (MRI). Follow up with me in 6 weeks for reevaluation.

## 2016-09-23 ENCOUNTER — Other Ambulatory Visit: Payer: Self-pay

## 2016-09-23 MED ORDER — FENOFIBRATE 54 MG PO TABS: ORAL_TABLET | ORAL | 1 refills | Status: DC

## 2016-09-23 MED ORDER — ATORVASTATIN CALCIUM 10 MG PO TABS: ORAL_TABLET | ORAL | 1 refills | Status: DC

## 2016-09-23 MED ORDER — ALPRAZOLAM 0.5 MG PO TABS: 0.5000 mg | ORAL_TABLET | Freq: Three times a day (TID) | ORAL | 0 refills | Status: DC | PRN

## 2016-09-23 MED ORDER — DUTASTERIDE 0.5 MG PO CAPS: ORAL_CAPSULE | ORAL | 1 refills | Status: DC

## 2016-09-23 NOTE — Assessment & Plan Note (Signed)
2/2 rotator cuff impingement.  No improvement with injection, some home exercises.  Exam reassuring otherwise.  Will start physical therapy and nitro patches (discussed risks of headache, skin irritation).  Consider further imaging if not improving.  F/u in 6 weeks.

## 2016-09-23 NOTE — Progress Notes (Signed)
PCP: Renato Shin, MD  Subjective:   HPI: Patient is a 61 y.o. male here for bilateral shoulder pain.  4/3: Patient reports for about 1 month he's had bilateral shoulder pain. Right side worse than left. No acute injury or trauma. He plays racquetball but hasn't been able to in past 2 months. Sleeps on right side and unable to do so. Worse with any movements of shoulder. Not taking any medications or using ice/heat. Pain is 0/10 at rest, up to 8/10 and sharp at worst. No skin changes, numbness.  5/15: Patient reports feeling the same as last visit. No improvement with injection. Doing home exercises. Pain intensity seems maybe better but pain lingers longer. Pain level 2/10 bilaterally,  Up to 8/10 and sharp at worst. Worse with overhead reaching. Not taking anything for this. No skin changes, numbness.  Past Medical History:  Diagnosis Date  . ANXIETY 01/03/2007   Qualifier: Diagnosis of  By: Loanne Drilling MD, Jacelyn Pi   . 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 Consuella Lose  . Degenerative joint disease of cervical spine   . DYSPNEA 09/01/2007   Qualifier: Diagnosis of  By: Loanne Drilling MD, Jacelyn Pi   . GERD 06/19/2008   Qualifier: Diagnosis of  By: Loanne Drilling MD, Jacelyn Pi   . HEARING LOSS 06/19/2008   Qualifier: Diagnosis of  By: Loanne Drilling MD, Jacelyn Pi   . HYPERCHOLESTEROLEMIA 09/01/2007   Qualifier: Diagnosis of  By: Loanne Drilling MD, Jacelyn Pi   . HYPERGLYCEMIA 09/01/2007   Qualifier: Diagnosis of  By: Loanne Drilling MD, Jacelyn Pi INSOMNIA 06/19/2008   Qualifier: Diagnosis of  By: Loanne Drilling MD, Jone Baseman, SPINE 06/19/2008   Qualifier: Diagnosis of  By: Loanne Drilling MD, Jacelyn Pi Positive H. pylori test 07/1997  . 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 Unspecified disorder of liver 06/19/2008   Qualifier: Diagnosis of  By: Loanne Drilling MD, Jacelyn Pi     Current Outpatient Prescriptions on File Prior to Visit  Medication Sig Dispense Refill  . alfuzosin  (UROXATRAL) 10 MG 24 hr tablet TK 1 T PO QD  12  . Fluticasone-Salmeterol (ADVAIR) 100-50 MCG/DOSE AEPB Inhale 1 puff into the lungs every 12 (twelve) hours. (Patient not taking: Reported on 06/23/2016) 1 each 3  . omeprazole (PRILOSEC) 20 MG capsule Take 1 capsule (20 mg total) by mouth daily. (Patient not taking: Reported on 06/23/2016) 90 capsule 1  . sertraline (ZOLOFT) 100 MG tablet Take 1 tablet (100 mg total) by mouth daily. 30 tablet 5  . tamsulosin (FLOMAX) 0.4 MG CAPS capsule Take 0.4 mg by mouth 2 (two) times daily.    Marland Kitchen zolpidem (AMBIEN CR) 12.5 MG CR tablet Take 1 tablet (12.5 mg total) by mouth at bedtime as needed. for sleep 30 tablet 5   Current Facility-Administered Medications on File Prior to Visit  Medication Dose Route Frequency Provider Last Rate Last Dose  . 0.9 %  sodium chloride infusion  500 mL Intravenous Continuous Doran Stabler, MD        Past Surgical History:  Procedure Laterality Date  . COLONOSCOPY    . ELECTROCARDIOGRAM  05/28/2006  . Stress Cardiolite  08/22/2003  . TYMPANOPLASTY  03/1996   Right    Allergies  Allergen Reactions  . Simvastatin     REACTION: Rash    Social History   Social History  .  Marital status: Married    Spouse name: N/A  . Number of children: 1  . Years of education: N/A   Occupational History  . College Professor  [employer]   Social History Main Topics  . Smoking status: Former Smoker    Years: 5.00    Types: Cigarettes  . Smokeless tobacco: Never Used  . Alcohol use Yes     Comment: social  . Drug use: No  . Sexual activity: Not on file   Other Topics Concern  . Not on file   Social History Narrative  . No narrative on file    Family History  Problem Relation Age of Onset  . Gastric cancer Father        died age 23  . Stroke Father   . Heart disease Mother   . Colon cancer Sister 19  . Other Brother        prostate issues, x 2 brothers    BP 118/77   Pulse 68   Ht 5' 10"  (1.778 m)   Wt 190 lb  (86.2 kg)   BMI 27.26 kg/m   Review of Systems: See HPI above.     Objective:  Physical Exam:  Gen: NAD, comfortable in exam room  Right shoulder: No swelling, ecchymoses.  No gross deformity. No TTP. FROM with painful arc. Positive Hawkins, Neers. Negative Yergasons. Strength 5/5 with empty can and resisted internal/external rotation.  Pain empty can. Negative apprehension. NV intact distally.  Left shoulder: No swelling, ecchymoses.  No gross deformity. No TTP. FROM with painful arc. Positive Hawkins, Neers. Negative Yergasons. Strength 5/5 with empty can and resisted internal/external rotation.  Mild pain empty can Negative apprehension. NV intact distally.   Assessment & Plan:  1. Bilateral shoulder pain - 2/2 rotator cuff impingement.  No improvement with injection, some home exercises.  Exam reassuring otherwise.  Will start physical therapy and nitro patches (discussed risks of headache, skin irritation).  Consider further imaging if not improving.  F/u in 6 weeks.

## 2016-09-25 ENCOUNTER — Ambulatory Visit (INDEPENDENT_AMBULATORY_CARE_PROVIDER_SITE_OTHER): Payer: BC Managed Care – PPO | Admitting: Endocrinology

## 2016-09-25 ENCOUNTER — Encounter: Payer: Self-pay | Admitting: Endocrinology

## 2016-09-25 VITALS — BP 134/82 | HR 93 | Ht 70.0 in | Wt 202.2 lb

## 2016-09-25 DIAGNOSIS — R739 Hyperglycemia, unspecified: Secondary | ICD-10-CM | POA: Diagnosis not present

## 2016-09-25 DIAGNOSIS — K769 Liver disease, unspecified: Secondary | ICD-10-CM | POA: Diagnosis not present

## 2016-09-25 DIAGNOSIS — Z0000 Encounter for general adult medical examination without abnormal findings: Secondary | ICD-10-CM

## 2016-09-25 DIAGNOSIS — M2550 Pain in unspecified joint: Secondary | ICD-10-CM | POA: Diagnosis not present

## 2016-09-25 DIAGNOSIS — Z125 Encounter for screening for malignant neoplasm of prostate: Secondary | ICD-10-CM

## 2016-09-25 LAB — URINALYSIS, ROUTINE W REFLEX MICROSCOPIC
Bilirubin Urine: NEGATIVE
Hgb urine dipstick: NEGATIVE
Ketones, ur: NEGATIVE
Leukocytes, UA: NEGATIVE
Nitrite: NEGATIVE
PH: 6 (ref 5.0–8.0)
RBC / HPF: NONE SEEN (ref 0–?)
SPECIFIC GRAVITY, URINE: 1.015 (ref 1.000–1.030)
Total Protein, Urine: NEGATIVE
Urine Glucose: NEGATIVE
Urobilinogen, UA: 0.2 (ref 0.0–1.0)

## 2016-09-25 LAB — HEPATIC FUNCTION PANEL
ALK PHOS: 42 U/L (ref 39–117)
ALT: 75 U/L — AB (ref 0–53)
AST: 39 U/L — ABNORMAL HIGH (ref 0–37)
Albumin: 4.7 g/dL (ref 3.5–5.2)
BILIRUBIN DIRECT: 0.2 mg/dL (ref 0.0–0.3)
BILIRUBIN TOTAL: 0.9 mg/dL (ref 0.2–1.2)
Total Protein: 7.2 g/dL (ref 6.0–8.3)

## 2016-09-25 LAB — BASIC METABOLIC PANEL
BUN: 13 mg/dL (ref 6–23)
CALCIUM: 9.9 mg/dL (ref 8.4–10.5)
CHLORIDE: 104 meq/L (ref 96–112)
CO2: 26 mEq/L (ref 19–32)
CREATININE: 0.91 mg/dL (ref 0.40–1.50)
GFR: 90.41 mL/min (ref >60.00)
Glucose, Bld: 115 mg/dL — ABNORMAL HIGH (ref 70–99)
Potassium: 4.3 mEq/L (ref 3.5–5.1)
Sodium: 138 mEq/L (ref 135–145)

## 2016-09-25 LAB — URIC ACID: Uric Acid, Serum: 3.5 mg/dL — ABNORMAL LOW (ref 4.0–7.8)

## 2016-09-25 LAB — LIPID PANEL
CHOL/HDL RATIO: 4
Cholesterol: 173 mg/dL (ref 0–200)
HDL: 45.8 mg/dL (ref >39.00)
LDL CALC: 91 mg/dL (ref 0–99)
NonHDL: 127.59
TRIGLYCERIDES: 184 mg/dL — AB (ref 0.0–149.0)
VLDL: 36.8 mg/dL (ref 0.0–40.0)

## 2016-09-25 LAB — HEMOGLOBIN A1C: HEMOGLOBIN A1C: 6.2 % (ref 4.6–6.5)

## 2016-09-25 LAB — CBC WITH DIFFERENTIAL/PLATELET
BASOS PCT: 0.5 % (ref 0.0–3.0)
Basophils Absolute: 0 10*3/uL (ref 0.0–0.1)
Eosinophils Absolute: 0.3 10*3/uL (ref 0.0–0.7)
Eosinophils Relative: 3.8 % (ref 0.0–5.0)
HEMATOCRIT: 43.1 % (ref 39.0–52.0)
Hemoglobin: 14.7 g/dL (ref 13.0–17.0)
LYMPHS ABS: 2.5 10*3/uL (ref 0.7–4.0)
LYMPHS PCT: 31.3 % (ref 12.0–46.0)
MCHC: 34.2 g/dL (ref 30.0–36.0)
MCV: 88.3 fl (ref 78.0–100.0)
MONOS PCT: 7.4 % (ref 3.0–12.0)
Monocytes Absolute: 0.6 10*3/uL (ref 0.1–1.0)
NEUTROS ABS: 4.6 10*3/uL (ref 1.4–7.7)
NEUTROS PCT: 57 % (ref 43.0–77.0)
PLATELETS: 191 10*3/uL (ref 150.0–400.0)
RBC: 4.87 Mil/uL (ref 4.22–5.81)
RDW: 13 % (ref 11.5–15.5)
WBC: 8.1 10*3/uL (ref 4.0–10.5)

## 2016-09-25 LAB — TSH: TSH: 0.49 u[IU]/mL (ref 0.35–4.50)

## 2016-09-25 LAB — SEDIMENTATION RATE: Sed Rate: 11 mm/hr (ref 0–20)

## 2016-09-25 LAB — PSA: PSA: 0.48 ng/mL (ref 0.10–4.00)

## 2016-09-25 MED ORDER — ZOLPIDEM TARTRATE 10 MG PO TABS
10.0000 mg | ORAL_TABLET | Freq: Every evening | ORAL | 0 refills | Status: DC | PRN
Start: 2016-09-25 — End: 2016-12-21

## 2016-09-25 MED ORDER — MIRABEGRON ER 50 MG PO TB24: 50.0000 mg | ORAL_TABLET | Freq: Every day | ORAL | 11 refills | Status: DC

## 2016-09-25 NOTE — Patient Instructions (Addendum)
I have sent a prescription to your pharmacy, to ty the myrbetriq.  Please consider these measures for your health:  minimize alcohol.  Do not use tobacco products.  Have a colonoscopy at least every 10 years from age 60.  Keep firearms safely stored.  Always use seat belts.  have working smoke alarms in your home.  See an eye doctor and dentist regularly.  Never drive under the influence of alcohol or drugs (including prescription drugs).  Those with fair skin should take precautions against the sun, and should carefully examine their skin once per month, for any new or changed moles.   blood tests are requested for you today.  We'll let you know about the results.   Please return in 1 year.

## 2016-09-25 NOTE — Progress Notes (Signed)
Subjective:    Patient ID: Luis Peterson, male    DOB: 1955-08-12, 61 y.o.   MRN: [id number]  HPI Pt is here for regular wellness examination, and is feeling pretty well in general, and says chronic med probs are stable, except as noted below Past Medical History:  Diagnosis Date  . ANXIETY 01/03/2007   Qualifier: Diagnosis of  By: Loanne Drilling MD, Jacelyn Pi   . 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 Consuella Lose  . Degenerative joint disease of cervical spine   . DYSPNEA 09/01/2007   Qualifier: Diagnosis of  By: Loanne Drilling MD, Jacelyn Pi   . GERD 06/19/2008   Qualifier: Diagnosis of  By: Loanne Drilling MD, Jacelyn Pi   . HEARING LOSS 06/19/2008   Qualifier: Diagnosis of  By: Loanne Drilling MD, Jacelyn Pi   . HYPERCHOLESTEROLEMIA 09/01/2007   Qualifier: Diagnosis of  By: Loanne Drilling MD, Jacelyn Pi   . HYPERGLYCEMIA 09/01/2007   Qualifier: Diagnosis of  By: Loanne Drilling MD, Jacelyn Pi INSOMNIA 06/19/2008   Qualifier: Diagnosis of  By: Loanne Drilling MD, Jone Baseman, SPINE 06/19/2008   Qualifier: Diagnosis of  By: Loanne Drilling MD, Jacelyn Pi Positive H. pylori test 07/1997  . 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 Unspecified disorder of liver 06/19/2008   Qualifier: Diagnosis of  By: Loanne Drilling MD, Jacelyn Pi     Past Surgical History:  Procedure Laterality Date  . COLONOSCOPY    . ELECTROCARDIOGRAM  05/28/2006  . Stress Cardiolite  08/22/2003  . TYMPANOPLASTY  03/1996   Right    Social History   Social History  . Marital status: Married    Spouse name: N/A  . Number of children: 1  . Years of education: N/A   Occupational History  . College Professor  [employer]   Social History Main Topics  . Smoking status: Former Smoker    Years: 5.00    Types: Cigarettes  . Smokeless tobacco: Never Used  . Alcohol use Yes     Comment: social  . Drug use: No  . Sexual activity: Not on file   Other Topics Concern  . Not on file   Social History Narrative  . No narrative on file     Current Outpatient Prescriptions on File Prior to Visit  Medication Sig Dispense Refill  . alfuzosin (UROXATRAL) 10 MG 24 hr tablet TK 1 T PO QD  12  . ALPRAZolam (XANAX) 0.5 MG tablet Take 1 tablet (0.5 mg total) by mouth 3 (three) times daily as needed. 90 tablet 0  . atorvastatin (LIPITOR) 10 MG tablet 1 tablet daily 90 tablet 1  . dutasteride (AVODART) 0.5 MG capsule 1 capsule every day. 30 capsule 1  . fenofibrate 54 MG tablet 1 tablet daily 30 tablet 1  . Fluticasone-Salmeterol (ADVAIR) 100-50 MCG/DOSE AEPB Inhale 1 puff into the lungs every 12 (twelve) hours. 1 each 3  . nitroGLYCERIN (NITRODUR - DOSED IN MG/24 HR) 0.2 mg/hr patch Apply 1/4th patch to right shoulder, change daily.  After 1 week can apply to left shoulder as well 30 patch 1  . omeprazole (PRILOSEC) 20 MG capsule Take 1 capsule (20 mg total) by mouth daily. 90 capsule 1  . sertraline (ZOLOFT) 100 MG tablet Take 1 tablet (100 mg total) by mouth daily. 30 tablet 5   Current Facility-Administered Medications on File Prior to Visit  Medication Dose Route Frequency Provider Last Rate Last Dose  . 0.9 %  sodium chloride infusion  500 mL Intravenous Continuous Doran Stabler, MD        Allergies  Allergen Reactions  . Simvastatin     REACTION: Rash    Family History  Problem Relation Age of Onset  . Gastric cancer Father        died age 61  . Stroke Father   . Heart disease Mother   . Colon cancer Sister 3  . Other Brother        prostate issues, x 2 brothers    BP 134/82 (BP Location: Left Arm, Patient Position: Sitting)   Pulse 93   Ht 5' 10" (1.778 m)   Wt 202 lb 3.2 oz (91.7 kg)   SpO2 96%   BMI 29.01 kg/m     Review of Systems Constitutional: Negative for fever.   Eyes: Negative for visual disturbance.  Respiratory: Negative for cough.   Cardiovascular: Negative for chest pain.  Gastrointestinal: Negative for anal bleeding.  Endocrine: Negative for cold intolerance.  Genitourinary:  Negative for hematuria.  Musculoskeletal: Negative for back pain.  Skin: Negative for rash.  Allergic/Immunologic: Negative for environmental allergies.  Hematological: Does not bruise/bleed easily.  Psychiatric/Behavioral: no depression.     Objective:   Physical Exam VS: see vs page GEN: no distress HEAD: head: no deformity eyes: no periorbital swelling, no proptosis external nose and ears are normal mouth: no lesion seen NECK: supple, thyroid is not enlarged CHEST WALL: no deformity LUNGS: clear to auscultation BREASTS:  No gynecomastia CV: reg rate and rhythm, no murmur ABD: abdomen is soft, nontender.  no hepatosplenomegaly.  not distended.  no hernia GENITALIA/RECTAL/PROSTATE: sees urology MUSCULOSKELETAL: muscle bulk and strength are grossly normal.  no obvious joint swelling.  gait is normal and steady EXTEMITIES: no deformity.  no ulcer on the feet.  feet are of normal color and temp.  no edema PULSES: dorsalis pedis intact bilat.  no carotid bruit NEURO:  cn 2-12 grossly intact.   readily moves all 4's.  sensation is intact to touch on the feet SKIN:  Normal texture and temperature.  No rash or suspicious lesion is visible.   NODES:  None palpable at the neck PSYCH: alert, well-oriented.  Does not appear anxious nor depressed.   I personally reviewed electrocardiogram tracing (today): Indication: wellness Impression: NSR.  No MI.  No hypertrophy. Compared to 2017: no change     Assessment & Plan:  Wellness visit today, with problems stable, except as noted.  SEPARATE EVALUATION FOLLOWS--EACH PROBLEM HERE IS NEW, NOT RESPONDING TO TREATMENT, OR POSES SIGNIFICANT RISK TO THE PATIENT'S HEALTH: HISTORY OF THE PRESENT ILLNESS: Decreased urinary stream persists despite flomax. Insomnia: he says [place] lasts into the next day.   Arthralgias also persist. worst of the knees, but also involves the lower back.  Declines x-rays and sm ref.  PAST MEDICAL HISTORY Past  Medical History:  Diagnosis Date  . ANXIETY 01/03/2007   Qualifier: Diagnosis of  By: Loanne Drilling MD, Jacelyn Pi   . 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 Consuella Lose  . Degenerative joint disease of cervical spine   . DYSPNEA 09/01/2007   Qualifier: Diagnosis of  By: Loanne Drilling MD, Jacelyn Pi   . GERD 06/19/2008   Qualifier: Diagnosis of  By: Loanne Drilling MD, Jacelyn Pi   . HEARING LOSS 06/19/2008   Qualifier: Diagnosis of  By: Loanne Drilling  MD, Jacelyn Pi   . HYPERCHOLESTEROLEMIA 09/01/2007   Qualifier: Diagnosis of  By: Loanne Drilling MD, Jacelyn Pi   . HYPERGLYCEMIA 09/01/2007   Qualifier: Diagnosis of  By: Loanne Drilling MD, Jacelyn Pi INSOMNIA 06/19/2008   Qualifier: Diagnosis of  By: Loanne Drilling MD, Jone Baseman, SPINE 06/19/2008   Qualifier: Diagnosis of  By: Loanne Drilling MD, Jacelyn Pi Positive H. pylori test 07/1997  . 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 Unspecified disorder of liver 06/19/2008   Qualifier: Diagnosis of  By: Loanne Drilling MD, Jacelyn Pi     Past Surgical History:  Procedure Laterality Date  . COLONOSCOPY    . ELECTROCARDIOGRAM  05/28/2006  . Stress Cardiolite  08/22/2003  . TYMPANOPLASTY  03/1996   Right    Social History   Social History  . Marital status: Married    Spouse name: N/A  . Number of children: 1  . Years of education: N/A   Occupational History  . College Professor  [employer]   Social History Main Topics  . Smoking status: Former Smoker    Years: 5.00    Types: Cigarettes  . Smokeless tobacco: Never Used  . Alcohol use Yes     Comment: social  . Drug use: No  . Sexual activity: Not on file   Other Topics Concern  . Not on file   Social History Narrative  . No narrative on file    Current Outpatient Prescriptions on File Prior to Visit  Medication Sig Dispense Refill  . alfuzosin (UROXATRAL) 10 MG 24 hr tablet TK 1 T PO QD  12  . ALPRAZolam (XANAX) 0.5 MG tablet Take 1 tablet (0.5 mg total) by mouth 3 (three) times daily as  needed. 90 tablet 0  . atorvastatin (LIPITOR) 10 MG tablet 1 tablet daily 90 tablet 1  . dutasteride (AVODART) 0.5 MG capsule 1 capsule every day. 30 capsule 1  . fenofibrate 54 MG tablet 1 tablet daily 30 tablet 1  . Fluticasone-Salmeterol (ADVAIR) 100-50 MCG/DOSE AEPB Inhale 1 puff into the lungs every 12 (twelve) hours. 1 each 3  . nitroGLYCERIN (NITRODUR - DOSED IN MG/24 HR) 0.2 mg/hr patch Apply 1/4th patch to right shoulder, change daily.  After 1 week can apply to left shoulder as well 30 patch 1  . omeprazole (PRILOSEC) 20 MG capsule Take 1 capsule (20 mg total) by mouth daily. 90 capsule 1  . sertraline (ZOLOFT) 100 MG tablet Take 1 tablet (100 mg total) by mouth daily. 30 tablet 5   Current Facility-Administered Medications on File Prior to Visit  Medication Dose Route Frequency Provider Last Rate Last Dose  . 0.9 %  sodium chloride infusion  500 mL Intravenous Continuous Doran Stabler, MD        Allergies  Allergen Reactions  . Simvastatin     REACTION: Rash    Family History  Problem Relation Age of Onset  . Gastric cancer Father        died age 31  . Stroke Father   . Heart disease Mother   . Colon cancer Sister 41  . Other Brother        prostate issues, x 2 brothers    BP 134/82 (BP Location: Left Arm, Patient Position: Sitting)   Pulse 93   Ht 5' 10" (1.778 m)   Wt 202 lb 3.2 oz (91.7 kg)   SpO2  96%   BMI 29.01 kg/m   REVIEW OF SYSTEMS: Decreased hearing in both ears persists, despite surgery a few years ago.  Ears don't hurt PHYSICAL EXAMINATION: VITAL SIGNS:  See vs page GENERAL: no distress Ears: Right TM prosthesis is noted IMPRESSION: BPH: he needs increased rx.  Might need TURP Decreased hearing, persistent Insomnia: worse despite rx Arthralgias, persistent. PLAN:  Check ESR.  Call if you want x-rays and/or ref Dr Tamala Julian Change Lorrin Mais to -IR. Emphasize PM use of xanax over AM. Call if you want ref back to ENT.  Change flomax to  myrbetriq.  F/u with urol soon.

## 2016-09-29 NOTE — Telephone Encounter (Signed)
Left message on 09/22/16 & 09/29/16 to call to schedule PT eval. However, insurance restarts on 10/09/16 with $1250 deductible

## 2016-10-01 ENCOUNTER — Telehealth: Payer: Self-pay | Admitting: Endocrinology

## 2016-10-01 NOTE — Telephone Encounter (Signed)
[facility] Prior Authorization called and requested to speak to nursing staff only about a prior authorization for patient. Please call the number provided about this information.

## 2016-10-02 NOTE — Telephone Encounter (Signed)
Information submitted the insurance. Waiting on response.

## 2016-10-07 ENCOUNTER — Encounter: Payer: Self-pay | Admitting: Physical Therapy

## 2016-10-07 ENCOUNTER — Ambulatory Visit: Payer: BC Managed Care – PPO | Attending: Family Medicine | Admitting: Physical Therapy

## 2016-10-07 DIAGNOSIS — R293 Abnormal posture: Secondary | ICD-10-CM | POA: Diagnosis present

## 2016-10-07 DIAGNOSIS — M25512 Pain in left shoulder: Secondary | ICD-10-CM

## 2016-10-07 DIAGNOSIS — M25511 Pain in right shoulder: Secondary | ICD-10-CM | POA: Diagnosis not present

## 2016-10-07 DIAGNOSIS — M25611 Stiffness of right shoulder, not elsewhere classified: Secondary | ICD-10-CM | POA: Diagnosis present

## 2016-10-07 NOTE — Therapy (Signed)
[place], [place], [address] Phone: [phone]   Fax:  [phone]  Physical Therapy Evaluation  Patient Details  Name: Luis Peterson MRN: [id number] Date of Birth: 1955-05-28 Referring Provider: Barbaraann Barthel  Encounter Date: 10/07/2016      PT End of Session - 10/07/16 0843    Visit Number 1   Date for PT Re-Evaluation 12/07/16   PT Start Time 0802   PT Stop Time 0845   PT Time Calculation (min) 43 min   Activity Tolerance Patient tolerated treatment well   Behavior During Therapy [facility] for tasks assessed/performed      Past Medical History:  Diagnosis Date  . ANXIETY 01/03/2007   Qualifier: Diagnosis of  By: Loanne Drilling MD, Jacelyn Pi   . 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 Consuella Lose  . Degenerative joint disease of cervical spine   . DYSPNEA 09/01/2007   Qualifier: Diagnosis of  By: Loanne Drilling MD, Jacelyn Pi   . GERD 06/19/2008   Qualifier: Diagnosis of  By: Loanne Drilling MD, Jacelyn Pi   . HEARING LOSS 06/19/2008   Qualifier: Diagnosis of  By: Loanne Drilling MD, Jacelyn Pi   . HYPERCHOLESTEROLEMIA 09/01/2007   Qualifier: Diagnosis of  By: Loanne Drilling MD, Jacelyn Pi   . HYPERGLYCEMIA 09/01/2007   Qualifier: Diagnosis of  By: Loanne Drilling MD, Jacelyn Pi INSOMNIA 06/19/2008   Qualifier: Diagnosis of  By: Loanne Drilling MD, Jone Baseman, SPINE 06/19/2008   Qualifier: Diagnosis of  By: Loanne Drilling MD, Jacelyn Pi Positive H. pylori test 07/1997  . 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 Unspecified disorder of liver 06/19/2008   Qualifier: Diagnosis of  By: Loanne Drilling MD, Jacelyn Pi     Past Surgical History:  Procedure Laterality Date  . COLONOSCOPY    . ELECTROCARDIOGRAM  05/28/2006  . Stress Cardiolite  08/22/2003  . TYMPANOPLASTY  03/1996   Right    There were no vitals filed for this visit.       Subjective Assessment - 10/07/16 0806    Subjective Patient reports that he has  been having bilateral shoulder pain for about two months right > left. He is right handed.  No imaging has been done.  Dr. Barbaraann Barthel gave him some exercises and he is reporting some improvement.     Limitations Lifting;House hold activities   Patient Stated Goals have less pain, return to racquetball   Currently in Pain? Yes   Pain Score 0-No pain   Pain Location Shoulder   Pain Orientation Right;Left   Pain Descriptors / Indicators Sharp;Aching   Pain Type Acute pain   Pain Onset More than a month ago   Pain Frequency Intermittent   Aggravating Factors  mostly with reaching back pain up to 8/10   Pain Relieving Factors rest, not reaching   Effect of Pain on Daily Activities has stopped racquetball            [facility] PT Assessment - 10/07/16 0001      Assessment   Medical Diagnosis shoulder pain   Referring Provider [place]   Onset Date/Surgical Date 08/07/16   Hand Dominance Right   Prior Therapy no     Precautions   Precautions None     Balance Screen   Has the patient fallen in the past 6 months No  Has the patient had a decrease in activity level because of a fear of falling?  No   Is the patient reluctant to leave their home because of a fear of falling?  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[employer], was lifting some weights 2x/week     Posture/Postural Control   Posture Comments fwd head, rounded shoulders     ROM / Strength   AROM / PROM / Strength AROM;Strength     AROM   AROM Assessment Site Shoulder   Right/Left Shoulder Right;Left   Right Shoulder Flexion 150 Degrees   Right Shoulder ABduction 130 Degrees   Right Shoulder Internal Rotation 50 Degrees   Right Shoulder External Rotation 80 Degrees   Left Shoulder Flexion 155 Degrees   Left Shoulder ABduction 140 Degrees    Left Shoulder Internal Rotation 55 Degrees   Left Shoulder External Rotation 80 Degrees     Strength   Overall Strength Comments strength of sholders 4/5 with some pain for IR/ER     Flexibility   Soft Tissue Assessment /Muscle Length --  some neural tension in the UE's     Palpation   Palpation comment non tender     Special Tests    Special Tests --  pain with empty can, + impingement            Objective measurements completed on examination: See above findings.                  PT Education - 10/07/16 [phone]    Education provided Yes   Education Details HEP with tband for shoulder ER and scapular stabilization, corner stretches   Person(s) Educated Patient   Methods Explanation;Demonstration;Handout   Comprehension Verbalized understanding;Returned demonstration;Verbal cues required          PT Short Term Goals - 10/07/16 0846      PT SHORT TERM GOAL #1   Title independent with initial HEP   Time 1   Period Weeks   Status New           PT Long Term Goals - 10/07/16 0847      PT LONG TERM GOAL #1   Title hold proper posture for 10 mintues   Time 8   Period Weeks   Status New     PT LONG TERM GOAL #2   Title decrease pain 50%   Time 8   Period Weeks   Status New     PT LONG TERM GOAL #3   Title increase shoulder flexion to 160 degrees   Time 8   Period Weeks   Status New     PT LONG TERM GOAL #4   Title return safely to the gym   Time [place] - 10/07/16 2409    Clinical Impression Statement Patient with bilateral shoulder pain for 2 months.  He was diagnosed with impingement.  He has poor posture, he is tight anterior pecs, with some weakness of scapular stabilizers and ER.  + impingment test.  He has decreased ROM for flexion and abduction   Clinical Presentation Stable   Clinical Decision Making Low   Rehab  Potential Good   PT Frequency 1x / week   PT Duration 6 weeks    PT Treatment/Interventions ADLs/Self Care Home Management;Cryotherapy;Electrical Stimulation;Iontophoresis 76m/ml Dexamethasone;Ultrasound;Moist Heat;Therapeutic activities;Therapeutic exercise;Patient/family education;Manual techniques   PT Next Visit Plan review HEP, go over safe gym activities with weights   Consulted and Agree with Plan of Care Patient      Patient will benefit from skilled therapeutic intervention in order to improve the following deficits and impairments:  Decreased strength, Postural dysfunction, Improper body mechanics, Pain, Increased muscle spasms, Decreased range of motion  Visit Diagnosis: Acute pain of right shoulder - Plan: PT plan of care cert/re-cert  Stiffness of right shoulder, not elsewhere classified - Plan: PT plan of care cert/re-cert  Acute pain of left shoulder - Plan: PT plan of care cert/re-cert  Abnormal posture - Plan: PT plan of care cert/re-cert     Problem List Patient Active Problem List   Diagnosis Date Noted  . Bilateral shoulder pain 08/13/2016  . Rectal bleeding 02/03/2016  . Wellness examination 09/26/2015  . Screening for prostate cancer 09/03/2014  . Low back pain 08/31/2013  . Arthralgia 10/30/2009  . FOOT PAIN 10/30/2009  . HEARING LOSS 06/19/2008  . GERD 06/19/2008  . Disorder of liver 06/19/2008  . 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 INSOMNIA 06/19/2008  . 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 HYPERCHOLESTEROLEMIA 09/01/2007  . DYSPNEA 09/01/2007  . Hyperglycemia 09/01/2007  . ANXIETY 01/03/2007    ASumner Boast, PT 10/07/2016, 8:50 AM  [facility] [address]Phone: [phone]  Fax:  [phone] Name: Luis PAYEURMRN: [id number] of Birth: [birth year]

## 2016-10-08 ENCOUNTER — Ambulatory Visit: Payer: BC Managed Care – PPO | Admitting: Physical Therapy

## 2016-10-09 ENCOUNTER — Other Ambulatory Visit: Payer: Self-pay | Admitting: Endocrinology

## 2016-10-13 ENCOUNTER — Encounter: Payer: Self-pay | Admitting: Endocrinology

## 2016-10-13 ENCOUNTER — Encounter: Payer: Self-pay | Admitting: Physical Therapy

## 2016-10-13 ENCOUNTER — Other Ambulatory Visit: Payer: Self-pay | Admitting: Endocrinology

## 2016-10-14 ENCOUNTER — Other Ambulatory Visit: Payer: Self-pay | Admitting: Endocrinology

## 2016-10-14 MED ORDER — FENOFIBRATE MICRONIZED 130 MG PO CAPS: 130.0000 mg | ORAL_CAPSULE | Freq: Every day | ORAL | 3 refills | Status: DC

## 2016-10-15 ENCOUNTER — Ambulatory Visit: Payer: BC Managed Care – PPO | Admitting: Physical Therapy

## 2016-10-15 ENCOUNTER — Other Ambulatory Visit: Payer: Self-pay

## 2016-10-20 ENCOUNTER — Ambulatory Visit: Payer: BC Managed Care – PPO | Attending: Family Medicine | Admitting: Physical Therapy

## 2016-10-20 DIAGNOSIS — M25512 Pain in left shoulder: Secondary | ICD-10-CM

## 2016-10-20 DIAGNOSIS — R293 Abnormal posture: Secondary | ICD-10-CM

## 2016-10-20 DIAGNOSIS — M25511 Pain in right shoulder: Secondary | ICD-10-CM | POA: Diagnosis present

## 2016-10-20 DIAGNOSIS — M25611 Stiffness of right shoulder, not elsewhere classified: Secondary | ICD-10-CM

## 2016-10-20 NOTE — Therapy (Addendum)
[place], [place], [address] Phone: [phone]   Fax:  [phone]  Physical Therapy Treatment  Patient Details  Name: Luis Peterson MRN: [id number] Date of Birth: 05-31-1955 Referring Provider: Barbaraann Barthel  Encounter Date: 10/20/2016      PT End of Session - 10/20/16 0853    Visit Number 2   Date for PT Re-Evaluation 12/07/16   PT Start Time 0851   PT Stop Time 0930   PT Time Calculation (min) 39 min   Activity Tolerance Patient tolerated treatment well   Behavior During Therapy [facility] for tasks assessed/performed      Past Medical History:  Diagnosis Date  . ANXIETY 01/03/2007   Qualifier: Diagnosis of  By: Loanne Drilling MD, Jacelyn Pi   . 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 Consuella Lose  . Degenerative joint disease of cervical spine   . DYSPNEA 09/01/2007   Qualifier: Diagnosis of  By: Loanne Drilling MD, Jacelyn Pi   . GERD 06/19/2008   Qualifier: Diagnosis of  By: Loanne Drilling MD, Jacelyn Pi   . HEARING LOSS 06/19/2008   Qualifier: Diagnosis of  By: Loanne Drilling MD, Jacelyn Pi   . HYPERCHOLESTEROLEMIA 09/01/2007   Qualifier: Diagnosis of  By: Loanne Drilling MD, Jacelyn Pi   . HYPERGLYCEMIA 09/01/2007   Qualifier: Diagnosis of  By: Loanne Drilling MD, Jacelyn Pi INSOMNIA 06/19/2008   Qualifier: Diagnosis of  By: Loanne Drilling MD, Jone Baseman, SPINE 06/19/2008   Qualifier: Diagnosis of  By: Loanne Drilling MD, Jacelyn Pi Positive H. pylori test 07/1997  . 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 Unspecified disorder of liver 06/19/2008   Qualifier: Diagnosis of  By: Loanne Drilling MD, Jacelyn Pi     Past Surgical History:  Procedure Laterality Date  . COLONOSCOPY    . ELECTROCARDIOGRAM  05/28/2006  . Stress Cardiolite  08/22/2003  . TYMPANOPLASTY  03/1996   Right    There were no vitals filed for this visit.      Subjective Assessment - 10/20/16 0852    Subjective Feeling well - "shoulders are  better, I think the exercises are helping, pain isn't as intense and it doesn't linger as long"   Patient Stated Goals have less pain, return to racquetball   Currently in Pain? No/denies   Pain Score 0-No pain                         [facility] Adult PT Treatment/Exercise - 10/20/16 0854      Exercises   Exercises Shoulder     Shoulder Exercises: Standing   Horizontal ABduction Strengthening;Both;15 reps;Theraband   Theraband Level (Shoulder Horizontal ABduction) Level 2 (Red)   Horizontal ABduction Weight (lbs) with scap squeeze   External Rotation Strengthening;Both;15 reps;Theraband   Theraband Level (Shoulder External Rotation) Level 2 (Red)   External Rotation Weight (lbs) elbows by side with scap squeeze   External Rotation Limitations unilateral ER - B sides - double yellow band x 15 reps   Internal Rotation Strengthening;Right;Left;15 reps;Theraband   Theraband Level (Shoulder Internal Rotation) Level 1 (Yellow)   Internal Rotation Limitations double yellow band   Retraction Both;15 reps   Retraction Limitations 5 sec hold     Shoulder Exercises: ROM/Strengthening   UBE (Upper Arm Bike) L3 x 6 minutes (3 fwd/3 bwd)  Cybex Row 10 reps   Cybex Row Limitations 2 sets - 20#   Other ROM/Strengthening Exercises BATCA pull down - 2 x 10 - 20#     Shoulder Exercises: Stretch   Corner Stretch 3 reps;30 seconds   Corner Stretch Limitations doorway - low/mid/high - heavy VC for gentle stretch                  PT Short Term Goals - 10/20/16 0853      PT SHORT TERM GOAL #1   Title independent with initial HEP   Status Achieved           PT Long Term Goals - 10/20/16 0854      PT LONG TERM GOAL #1   Title hold proper posture for 10 mintues   Status Not Met     PT LONG TERM GOAL #2   Title decrease pain 50%   Status Not Met     PT LONG TERM GOAL #3   Title increase shoulder flexion to 160 degrees   Status Not Met     PT LONG TERM GOAL #4    Title return safely to the gym   Status Not Met               Plan - 10/20/16 0854    Clinical Impression Statement Good progression of strengthening today of both RTC and periscapular musculature. Only slight pain with mid and high doorway pec stretch with conitnuous VC needed for gentle stretching in pain free ranges with good carryover.    PT Treatment/Interventions ADLs/Self Care Home Management;Cryotherapy;Electrical Stimulation;Iontophoresis 56m/ml Dexamethasone;Ultrasound;Moist Heat;Therapeutic activities;Therapeutic exercise;Patient/family education;Manual techniques   Consulted and Agree with Plan of Care Patient      Patient will benefit from skilled therapeutic intervention in order to improve the following deficits and impairments:  Decreased strength, Postural dysfunction, Improper body mechanics, Pain, Increased muscle spasms, Decreased range of motion  Visit Diagnosis: Acute pain of right shoulder  Stiffness of right shoulder, not elsewhere classified  Acute pain of left shoulder  Abnormal posture     Problem List Patient Active Problem List   Diagnosis Date Noted  . Bilateral shoulder pain 08/13/2016  . Rectal bleeding 02/03/2016  . Wellness examination 09/26/2015  . Screening for prostate cancer 09/03/2014  . Low back pain 08/31/2013  . Arthralgia 10/30/2009  . FOOT PAIN 10/30/2009  . HEARING LOSS 06/19/2008  . GERD 06/19/2008  . Disorder of liver 06/19/2008  . 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 INSOMNIA 06/19/2008  . 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 HYPERCHOLESTEROLEMIA 09/01/2007  . DYSPNEA 09/01/2007  . Hyperglycemia 09/01/2007  . ANXIETY 01/03/2007     SLanney Gins PT, DPT 10/20/16 9:31 AM  Patient did not return to therapy following today's session.  All LTGs not met.  [address] [address]Phone: [phone]  Fax:  [phone] Name: HHUIE GHUMANMRN: [id number] of Birth: [birth year]

## 2016-10-27 ENCOUNTER — Ambulatory Visit: Payer: BC Managed Care – PPO | Admitting: Physical Therapy

## 2016-10-28 ENCOUNTER — Ambulatory Visit: Payer: BC Managed Care – PPO | Admitting: Physical Therapy

## 2016-11-03 ENCOUNTER — Ambulatory Visit: Payer: BC Managed Care – PPO | Admitting: Family Medicine

## 2016-11-04 ENCOUNTER — Ambulatory Visit: Payer: BC Managed Care – PPO | Admitting: Physical Therapy

## 2016-11-13 ENCOUNTER — Other Ambulatory Visit: Payer: Self-pay | Admitting: Endocrinology

## 2016-11-18 ENCOUNTER — Ambulatory Visit: Payer: BC Managed Care – PPO | Admitting: Family Medicine

## 2016-11-29 ENCOUNTER — Other Ambulatory Visit: Payer: Self-pay | Admitting: Endocrinology

## 2016-11-30 NOTE — Telephone Encounter (Signed)
Please advise if ok to refill this medication. Not on current list.

## 2016-12-15 ENCOUNTER — Other Ambulatory Visit: Payer: Self-pay | Admitting: Endocrinology

## 2016-12-16 ENCOUNTER — Other Ambulatory Visit: Payer: Self-pay | Admitting: Endocrinology

## 2016-12-17 ENCOUNTER — Other Ambulatory Visit: Payer: Self-pay

## 2016-12-17 ENCOUNTER — Telehealth: Payer: Self-pay

## 2016-12-17 MED ORDER — TAMSULOSIN HCL 0.4 MG PO CAPS: ORAL_CAPSULE | ORAL | 0 refills | Status: DC

## 2016-12-17 MED ORDER — SERTRALINE HCL 100 MG PO TABS: ORAL_TABLET | ORAL | 0 refills | Status: DC

## 2016-12-17 NOTE — Telephone Encounter (Signed)
Options: Refill each x 1 I can do when I return

## 2016-12-17 NOTE — Telephone Encounter (Signed)
Ok to refill [place] and xanax?

## 2016-12-18 NOTE — Telephone Encounter (Signed)
Ok I didn't fill the Xanax or Ambein.

## 2016-12-21 ENCOUNTER — Other Ambulatory Visit: Payer: Self-pay | Admitting: Endocrinology

## 2016-12-21 MED ORDER — ZOLPIDEM TARTRATE 10 MG PO TABS: 10.0000 mg | ORAL_TABLET | Freq: Every evening | ORAL | 0 refills | Status: DC | PRN

## 2016-12-21 MED ORDER — ALPRAZOLAM 0.5 MG PO TABS: 0.5000 mg | ORAL_TABLET | Freq: Three times a day (TID) | ORAL | 0 refills | Status: DC | PRN

## 2016-12-21 NOTE — Telephone Encounter (Signed)
I printed

## 2016-12-21 NOTE — Telephone Encounter (Signed)
Thank you will fax over!

## 2016-12-23 ENCOUNTER — Other Ambulatory Visit: Payer: Self-pay | Admitting: Endocrinology

## 2016-12-30 ENCOUNTER — Other Ambulatory Visit: Payer: Self-pay | Admitting: Endocrinology

## 2017-01-01 ENCOUNTER — Other Ambulatory Visit: Payer: Self-pay

## 2017-01-17 ENCOUNTER — Other Ambulatory Visit: Payer: Self-pay | Admitting: Endocrinology

## 2017-01-24 ENCOUNTER — Other Ambulatory Visit: Payer: Self-pay | Admitting: Endocrinology

## 2017-01-25 ENCOUNTER — Other Ambulatory Visit: Payer: Self-pay

## 2017-02-01 ENCOUNTER — Other Ambulatory Visit: Payer: Self-pay | Admitting: Endocrinology

## 2017-02-03 ENCOUNTER — Other Ambulatory Visit: Payer: Self-pay | Admitting: Endocrinology

## 2017-02-10 ENCOUNTER — Other Ambulatory Visit: Payer: Self-pay | Admitting: Endocrinology

## 2017-02-11 ENCOUNTER — Other Ambulatory Visit: Payer: Self-pay | Admitting: Endocrinology

## 2017-02-14 ENCOUNTER — Other Ambulatory Visit: Payer: Self-pay | Admitting: Endocrinology

## 2017-02-25 ENCOUNTER — Other Ambulatory Visit: Payer: Self-pay | Admitting: Endocrinology

## 2017-02-26 ENCOUNTER — Other Ambulatory Visit: Payer: Self-pay | Admitting: Endocrinology

## 2017-03-09 ENCOUNTER — Other Ambulatory Visit: Payer: Self-pay | Admitting: Endocrinology

## 2017-03-18 ENCOUNTER — Other Ambulatory Visit: Payer: Self-pay | Admitting: Endocrinology

## 2017-03-18 NOTE — Telephone Encounter (Signed)
Ok to fill these? There had been a lot of refills done in the past couple months?

## 2017-03-24 ENCOUNTER — Other Ambulatory Visit: Payer: Self-pay

## 2017-03-24 MED ORDER — TAMSULOSIN HCL 0.4 MG PO CAPS: ORAL_CAPSULE | ORAL | 0 refills | Status: DC

## 2017-03-27 ENCOUNTER — Other Ambulatory Visit: Payer: Self-pay | Admitting: Endocrinology

## 2017-03-31 ENCOUNTER — Other Ambulatory Visit: Payer: Self-pay | Admitting: Endocrinology

## 2017-04-07 ENCOUNTER — Other Ambulatory Visit: Payer: Self-pay | Admitting: Endocrinology

## 2017-04-20 ENCOUNTER — Other Ambulatory Visit: Payer: Self-pay | Admitting: Endocrinology

## 2017-04-21 ENCOUNTER — Other Ambulatory Visit: Payer: Self-pay | Admitting: Endocrinology

## 2017-04-29 ENCOUNTER — Other Ambulatory Visit: Payer: Self-pay | Admitting: Endocrinology

## 2017-04-30 NOTE — Telephone Encounter (Signed)
Patient stated that neither prescription was an emergency to have refilled & can wait until Monday. He was concerned however that he was taking a high dose of fenofibrate & lipitor. He was told that these two are not good to take together. Please advise?

## 2017-04-30 NOTE — Telephone Encounter (Signed)
At these dosages, it is safe.

## 2017-04-30 NOTE — Telephone Encounter (Signed)
please call patient: Can this wait for Monday, or does he need today?

## 2017-05-05 ENCOUNTER — Other Ambulatory Visit: Payer: Self-pay

## 2017-05-05 MED ORDER — DUTASTERIDE 0.5 MG PO CAPS: ORAL_CAPSULE | ORAL | 0 refills | Status: DC

## 2017-05-10 ENCOUNTER — Other Ambulatory Visit: Payer: Self-pay | Admitting: Endocrinology

## 2017-06-06 ENCOUNTER — Other Ambulatory Visit: Payer: Self-pay | Admitting: Endocrinology

## 2017-06-08 ENCOUNTER — Other Ambulatory Visit: Payer: Self-pay | Admitting: Endocrinology

## 2017-06-08 ENCOUNTER — Encounter: Payer: Self-pay | Admitting: Endocrinology

## 2017-06-09 ENCOUNTER — Other Ambulatory Visit: Payer: Self-pay | Admitting: Endocrinology

## 2017-06-09 MED ORDER — ESZOPICLONE 1 MG PO TABS
1.0000 mg | ORAL_TABLET | Freq: Every evening | ORAL | 0 refills | Status: DC | PRN
Start: 2017-06-09 — End: 2017-09-19

## 2017-06-09 MED ORDER — ALPRAZOLAM 0.5 MG PO TABS: 0.5000 mg | ORAL_TABLET | Freq: Three times a day (TID) | ORAL | 0 refills | Status: DC | PRN

## 2017-06-09 MED ORDER — DUTASTERIDE 0.5 MG PO CAPS: ORAL_CAPSULE | ORAL | 0 refills | Status: DC

## 2017-06-09 MED ORDER — SERTRALINE HCL 100 MG PO TABS: 100.0000 mg | ORAL_TABLET | Freq: Every day | ORAL | 0 refills | Status: DC

## 2017-06-09 MED ORDER — TAMSULOSIN HCL 0.4 MG PO CAPS: ORAL_CAPSULE | ORAL | 0 refills | Status: DC

## 2017-06-09 NOTE — Telephone Encounter (Signed)
This is an ellison patient

## 2017-06-12 ENCOUNTER — Other Ambulatory Visit: Payer: Self-pay | Admitting: Endocrinology

## 2017-07-06 ENCOUNTER — Other Ambulatory Visit: Payer: Self-pay | Admitting: Endocrinology

## 2017-07-13 ENCOUNTER — Other Ambulatory Visit: Payer: Self-pay | Admitting: Endocrinology

## 2017-07-14 ENCOUNTER — Other Ambulatory Visit: Payer: Self-pay | Admitting: Endocrinology

## 2017-07-14 MED ORDER — ZOLPIDEM TARTRATE 10 MG PO TABS: 10.0000 mg | ORAL_TABLET | Freq: Every evening | ORAL | 3 refills | Status: DC | PRN

## 2017-07-14 MED ORDER — ALPRAZOLAM 0.5 MG PO TABS: 0.5000 mg | ORAL_TABLET | Freq: Three times a day (TID) | ORAL | 1 refills | Status: DC | PRN

## 2017-08-17 ENCOUNTER — Other Ambulatory Visit: Payer: Self-pay | Admitting: Endocrinology

## 2017-09-05 ENCOUNTER — Encounter: Payer: Self-pay | Admitting: Endocrinology

## 2017-09-19 ENCOUNTER — Other Ambulatory Visit: Payer: Self-pay | Admitting: Endocrinology

## 2017-09-20 MED ORDER — DUTASTERIDE 0.5 MG PO CAPS: ORAL_CAPSULE | ORAL | 0 refills | Status: DC

## 2017-09-20 MED ORDER — TAMSULOSIN HCL 0.4 MG PO CAPS: ORAL_CAPSULE | ORAL | 0 refills | Status: DC

## 2017-09-20 MED ORDER — ESZOPICLONE 1 MG PO TABS: 1.0000 mg | ORAL_TABLET | Freq: Every evening | ORAL | 0 refills | Status: DC | PRN

## 2017-09-22 ENCOUNTER — Other Ambulatory Visit: Payer: Self-pay | Admitting: Endocrinology

## 2017-09-23 ENCOUNTER — Other Ambulatory Visit: Payer: Self-pay | Admitting: Endocrinology

## 2017-09-23 MED ORDER — ESZOPICLONE 1 MG PO TABS: 1.0000 mg | ORAL_TABLET | Freq: Every evening | ORAL | 2 refills | Status: DC | PRN

## 2017-09-27 ENCOUNTER — Ambulatory Visit: Payer: BC Managed Care – PPO | Admitting: Endocrinology

## 2017-09-28 ENCOUNTER — Encounter: Payer: Self-pay | Admitting: Endocrinology

## 2017-09-28 ENCOUNTER — Ambulatory Visit: Payer: BC Managed Care – PPO | Admitting: Endocrinology

## 2017-09-28 VITALS — BP 124/82 | HR 77 | Wt 210.2 lb

## 2017-09-28 DIAGNOSIS — Z125 Encounter for screening for malignant neoplasm of prostate: Secondary | ICD-10-CM | POA: Diagnosis not present

## 2017-09-28 DIAGNOSIS — K7581 Nonalcoholic steatohepatitis (NASH): Secondary | ICD-10-CM | POA: Diagnosis not present

## 2017-09-28 DIAGNOSIS — Z0000 Encounter for general adult medical examination without abnormal findings: Secondary | ICD-10-CM | POA: Diagnosis not present

## 2017-09-28 DIAGNOSIS — R739 Hyperglycemia, unspecified: Secondary | ICD-10-CM | POA: Diagnosis not present

## 2017-09-28 DIAGNOSIS — R635 Abnormal weight gain: Secondary | ICD-10-CM | POA: Insufficient documentation

## 2017-09-28 HISTORY — DX: Nonalcoholic steatohepatitis (NASH): K75.81

## 2017-09-28 HISTORY — DX: Abnormal weight gain: R63.5

## 2017-09-28 LAB — URINALYSIS, ROUTINE W REFLEX MICROSCOPIC
BILIRUBIN URINE: NEGATIVE
Hgb urine dipstick: NEGATIVE
Ketones, ur: NEGATIVE
Leukocytes, UA: NEGATIVE
Nitrite: NEGATIVE
PH: 6.5 (ref 5.0–8.0)
RBC / HPF: NONE SEEN (ref 0–?)
TOTAL PROTEIN, URINE-UPE24: NEGATIVE
UROBILINOGEN UA: 0.2 (ref 0.0–1.0)
Urine Glucose: NEGATIVE

## 2017-09-28 LAB — HEPATIC FUNCTION PANEL
ALK PHOS: 36 U/L — AB (ref 39–117)
ALT: 100 U/L — AB (ref 0–53)
AST: 54 U/L — ABNORMAL HIGH (ref 0–37)
Albumin: 4.5 g/dL (ref 3.5–5.2)
BILIRUBIN DIRECT: 0.2 mg/dL (ref 0.0–0.3)
BILIRUBIN TOTAL: 0.7 mg/dL (ref 0.2–1.2)
TOTAL PROTEIN: 7.2 g/dL (ref 6.0–8.3)

## 2017-09-28 LAB — LIPID PANEL
Cholesterol: 149 mg/dL (ref 0–200)
HDL: 51.4 mg/dL (ref >39.00)
LDL CALC: 75 mg/dL (ref 0–99)
NonHDL: 97.33
TRIGLYCERIDES: 112 mg/dL (ref 0.0–149.0)
Total CHOL/HDL Ratio: 3
VLDL: 22.4 mg/dL (ref 0.0–40.0)

## 2017-09-28 LAB — BASIC METABOLIC PANEL
BUN: 12 mg/dL (ref 6–23)
CHLORIDE: 104 meq/L (ref 96–112)
CO2: 25 meq/L (ref 19–32)
Calcium: 9.6 mg/dL (ref 8.4–10.5)
Creatinine, Ser: 0.83 mg/dL (ref 0.40–1.50)
GFR: 100.2 mL/min (ref >60.00)
Glucose, Bld: 109 mg/dL — ABNORMAL HIGH (ref 70–99)
Potassium: 4 mEq/L (ref 3.5–5.1)
SODIUM: 138 meq/L (ref 135–145)

## 2017-09-28 LAB — CBC WITH DIFFERENTIAL/PLATELET
BASOS PCT: 0.3 % (ref 0.0–3.0)
Basophils Absolute: 0 10*3/uL (ref 0.0–0.1)
EOS ABS: 0.2 10*3/uL (ref 0.0–0.7)
Eosinophils Relative: 2.9 % (ref 0.0–5.0)
HEMATOCRIT: 41.8 % (ref 39.0–52.0)
Hemoglobin: 14.3 g/dL (ref 13.0–17.0)
LYMPHS ABS: 2.9 10*3/uL (ref 0.7–4.0)
LYMPHS PCT: 41.3 % (ref 12.0–46.0)
MCHC: 34.3 g/dL (ref 30.0–36.0)
MCV: 87.9 fl (ref 78.0–100.0)
MONOS PCT: 8.3 % (ref 3.0–12.0)
Monocytes Absolute: 0.6 10*3/uL (ref 0.1–1.0)
NEUTROS ABS: 3.3 10*3/uL (ref 1.4–7.7)
NEUTROS PCT: 47.2 % (ref 43.0–77.0)
PLATELETS: 186 10*3/uL (ref 150.0–400.0)
RBC: 4.75 Mil/uL (ref 4.22–5.81)
RDW: 12.7 % (ref 11.5–15.5)
WBC: 6.9 10*3/uL (ref 4.0–10.5)

## 2017-09-28 LAB — PSA: PSA: 0.67 ng/mL (ref 0.10–4.00)

## 2017-09-28 LAB — URIC ACID: Uric Acid, Serum: 2.7 mg/dL — ABNORMAL LOW (ref 4.0–7.8)

## 2017-09-28 LAB — TSH: TSH: 0.54 u[IU]/mL (ref 0.35–4.50)

## 2017-09-28 LAB — HEMOGLOBIN A1C: Hgb A1c MFr Bld: 6.3 % (ref 4.6–6.5)

## 2017-09-28 NOTE — Patient Instructions (Addendum)
Please let me know if you want to see a lung specialist, for the breathing. Please consider these measures for your health:  minimize alcohol.  Do not use tobacco products.  Have a colonoscopy at least every 10 years from age 62.  Keep firearms safely stored.  Always use seat belts.  have working smoke alarms in your home.  See an eye doctor and dentist regularly.  Never drive under the influence of alcohol or drugs (including prescription drugs).  Those with fair skin should take precautions against the sun, and should carefully examine their skin once per month, for any new or changed moles. blood tests are requested for you today.  We'll let you know about the results.  Please come back for a follow-up appointment in 1 year.

## 2017-09-28 NOTE — Progress Notes (Signed)
Subjective:    Patient ID: Luis Peterson, male    DOB: [birth year], 62 y.o.   MRN: [id number]  HPI Pt is here for regular wellness examination, and is feeling pretty well in general, and says chronic med probs are stable, except as noted below Past Medical History:  Diagnosis Date  . ANXIETY 01/03/2007   Qualifier: Diagnosis of  By: Loanne Drilling MD, Jacelyn Pi   . 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 Consuella Lose  . Degenerative joint disease of cervical spine   . DYSPNEA 09/01/2007   Qualifier: Diagnosis of  By: Loanne Drilling MD, Jacelyn Pi   . GERD 06/19/2008   Qualifier: Diagnosis of  By: Loanne Drilling MD, Jacelyn Pi   . HEARING LOSS 06/19/2008   Qualifier: Diagnosis of  By: Loanne Drilling MD, Jacelyn Pi   . HYPERCHOLESTEROLEMIA 09/01/2007   Qualifier: Diagnosis of  By: Loanne Drilling MD, Jacelyn Pi   . HYPERGLYCEMIA 09/01/2007   Qualifier: Diagnosis of  By: Loanne Drilling MD, Jacelyn Pi INSOMNIA 06/19/2008   Qualifier: Diagnosis of  By: Loanne Drilling MD, Jone Baseman, SPINE 06/19/2008   Qualifier: Diagnosis of  By: Loanne Drilling MD, Jacelyn Pi Positive H. pylori test 07/1997  . 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 Unspecified disorder of liver 06/19/2008   Qualifier: Diagnosis of  By: Loanne Drilling MD, Jacelyn Pi     Past Surgical History:  Procedure Laterality Date  . COLONOSCOPY    . ELECTROCARDIOGRAM  05/28/2006  . Stress Cardiolite  08/22/2003  . TYMPANOPLASTY  03/1996   Right    Social History   Socioeconomic History  . Marital status: Married    Spouse name: Not on file  . Number of children: 1  . Years of education: Not on file  . Highest education level: Not on file  Occupational History  . Occupation: Secretary/administrator Professor     Employer: [employer]  Social Needs  . Financial resource strain: Not on file  . Food insecurity:    Worry: Not on file    Inability: Not on file  . Transportation needs:    Medical: Not on file    Non-medical: Not on file  Tobacco Use  . Smoking  status: Former Smoker    Years: 5.00    Types: Cigarettes  . Smokeless tobacco: Never Used  Substance and Sexual Activity  . Alcohol use: Yes    Comment: social  . Drug use: No  . Sexual activity: Not on file  Lifestyle  . Physical activity:    Days per week: Not on file    Minutes per session: Not on file  . Stress: Not on file  Relationships  . Social connections:    Talks on phone: Not on file    Gets together: Not on file    Attends religious service: Not on file    Active member of club or organization: Not on file    Attends meetings of clubs or organizations: Not on file    Relationship status: Not on file  . Intimate partner violence:    Fear of current or ex partner: Not on file    Emotionally abused: Not on file    Physically abused: Not on file    Forced sexual activity: Not on file  Other Topics Concern  . Not on file  Social History Narrative  . Not on file  Current Outpatient Medications on File Prior to Visit  Medication Sig Dispense Refill  . alfuzosin (UROXATRAL) 10 MG 24 hr tablet TK 1 T PO QD  12  . ALPRAZolam (XANAX) 0.5 MG tablet Take 1 tablet (0.5 mg total) by mouth 3 (three) times daily as needed. 90 tablet 1  . atorvastatin (LIPITOR) 10 MG tablet 1 tablet daily 90 tablet 1  . dutasteride (AVODART) 0.5 MG capsule TAKE 1 CAPSULE BY MOUTH EVERY DAY 30 capsule 0  . mirabegron ER (MYRBETRIQ) 50 MG TB24 tablet Take 1 tablet (50 mg total) by mouth daily. 30 tablet 11  . nitroGLYCERIN (NITRODUR - DOSED IN MG/24 HR) 0.2 mg/hr patch Apply 1/4th patch to right shoulder, change daily.  After 1 week can apply to left shoulder as well 30 patch 1  . omeprazole (PRILOSEC) 20 MG capsule Take 1 capsule (20 mg total) by mouth daily. 90 capsule 1  . sertraline (ZOLOFT) 100 MG tablet Take 1 tablet (100 mg total) by mouth daily. 30 tablet 0  . tamsulosin (FLOMAX) 0.4 MG CAPS capsule TAKE 1 CAPSULE(0.4 MG) BY MOUTH TWICE DAILY 60 capsule 0  . eszopiclone (LUNESTA) 1 MG  TABS tablet Take 1 tablet (1 mg total) by mouth at bedtime as needed for sleep. Take immediately before bedtime (Patient not taking: Reported on 09/28/2017) 30 tablet 2  . Fluticasone-Salmeterol (ADVAIR) 100-50 MCG/DOSE AEPB Inhale 1 puff into the lungs every 12 (twelve) hours. (Patient not taking: Reported on 09/28/2017) 1 each 3   Current Facility-Administered Medications on File Prior to Visit  Medication Dose Route Frequency Provider Last Rate Last Dose  . 0.9 %  sodium chloride infusion  500 mL Intravenous Continuous Doran Stabler, MD        Allergies  Allergen Reactions  . Simvastatin     REACTION: Rash    Family History  Problem Relation Age of Onset  . Gastric cancer Father        died age 93  . Stroke Father   . Heart disease Mother   . Colon cancer Sister 16  . Other Brother        prostate issues, x 2 brothers    BP 124/82   Pulse 77   Wt 210 lb 3.2 oz (95.3 kg)   SpO2 95%   BMI 30.16 kg/m        Review of Systems Denies fever, fatigue, visual loss, hearing loss, chest pain, back pain, depression, cold intolerance, BRBPR, hematuria, syncope, numbness, allergy sxs, easy bruising, urinary hesitancy, and rash.        Objective:   Physical Exam VS: see vs page GEN: no distress HEAD: head: no deformity eyes: no periorbital swelling, no proptosis external nose and ears are normal mouth: no lesion seen NECK: supple, thyroid is not enlarged CHEST WALL: no deformity LUNGS: clear to auscultation BREASTS:  No gynecomastia CV: reg rate and rhythm, no murmur ABD: abdomen is soft, nontender.  no hepatosplenomegaly.  not distended.  no hernia.   RECTAL: normal external and internal exam.  heme neg. PROSTATE:  Normal size.  No nodule MUSCULOSKELETAL: muscle bulk and strength are grossly normal.  no obvious joint swelling.  gait is normal and steady EXTEMITIES: no deformity.  no ulcer on the feet.  feet are of normal color and temp.   PULSES: dorsalis pedis intact  bilat.  no carotid bruit NEURO:  cn 2-12 grossly intact.   readily moves all 4's.  sensation is intact to touch on  the feet SKIN:  Normal texture and temperature.  No rash or suspicious lesion is visible.   NODES:  None palpable at the neck PSYCH: alert, well-oriented.  Does not appear anxious nor depressed.    I personally reviewed electrocardiogram tracing (today): Indication: wellness Impression: NSR.  No MI.  No hypertrophy. Compared to 2018: no significant change      Assessment & Plan:  Wellness visit today, with problems stable, except as noted.  Patient Instructions  Please let me know if you want to see a lung specialist, for the breathing. Please consider these measures for your health:  minimize alcohol.  Do not use tobacco products.  Have a colonoscopy at least every 10 years from age 85.  Keep firearms safely stored.  Always use seat belts.  have working smoke alarms in your home.  See an eye doctor and dentist regularly.  Never drive under the influence of alcohol or drugs (including prescription drugs).  Those with fair skin should take precautions against the sun, and should carefully examine their skin once per month, for any new or changed moles. blood tests are requested for you today.  We'll let you know about the results.  Please come back for a follow-up appointment in 1 year.     SEPARATE EVALUATION FOLLOWS--EACH PROBLEM HERE IS NEW, NOT RESPONDING TO TREATMENT, OR POSES SIGNIFICANT RISK TO THE PATIENT'S HEALTH: HISTORY OF THE PRESENT ILLNESS: Pt has mixed dyslipidemia.  He takes 2 meds, but he would like to decrease fenofibrate if he can. PAST MEDICAL HISTORY Past Medical History:  Diagnosis Date  . ANXIETY 01/03/2007   Qualifier: Diagnosis of  By: Loanne Drilling MD, Jacelyn Pi   . 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 Consuella Lose  . Degenerative joint disease of cervical spine   . DYSPNEA 09/01/2007   Qualifier: Diagnosis of  By: Loanne Drilling MD, Jacelyn Pi    . GERD 06/19/2008   Qualifier: Diagnosis of  By: Loanne Drilling MD, Jacelyn Pi   . HEARING LOSS 06/19/2008   Qualifier: Diagnosis of  By: Loanne Drilling MD, Jacelyn Pi   . HYPERCHOLESTEROLEMIA 09/01/2007   Qualifier: Diagnosis of  By: Loanne Drilling MD, Jacelyn Pi   . HYPERGLYCEMIA 09/01/2007   Qualifier: Diagnosis of  By: Loanne Drilling MD, Jacelyn Pi INSOMNIA 06/19/2008   Qualifier: Diagnosis of  By: Loanne Drilling MD, Jone Baseman, SPINE 06/19/2008   Qualifier: Diagnosis of  By: Loanne Drilling MD, Jacelyn Pi Positive H. pylori test 07/1997  . 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 Unspecified disorder of liver 06/19/2008   Qualifier: Diagnosis of  By: Loanne Drilling MD, Jacelyn Pi     Past Surgical History:  Procedure Laterality Date  . COLONOSCOPY    . ELECTROCARDIOGRAM  05/28/2006  . Stress Cardiolite  08/22/2003  . TYMPANOPLASTY  03/1996   Right    Social History   Socioeconomic History  . Marital status: Married    Spouse name: Not on file  . Number of children: 1  . Years of education: Not on file  . Highest education level: Not on file  Occupational History  . Occupation: Secretary/administrator Professor     Employer: [employer]  Social Needs  . Financial resource strain: Not on file  . Food insecurity:    Worry: Not on file    Inability: Not on file  . Transportation needs:    Medical:  Not on file    Non-medical: Not on file  Tobacco Use  . Smoking status: Former Smoker    Years: 5.00    Types: Cigarettes  . Smokeless tobacco: Never Used  Substance and Sexual Activity  . Alcohol use: Yes    Comment: social  . Drug use: No  . Sexual activity: Not on file  Lifestyle  . Physical activity:    Days per week: Not on file    Minutes per session: Not on file  . Stress: Not on file  Relationships  . Social connections:    Talks on phone: Not on file    Gets together: Not on file    Attends religious service: Not on file    Active member of club or organization: Not on file    Attends meetings of  clubs or organizations: Not on file    Relationship status: Not on file  . Intimate partner violence:    Fear of current or ex partner: Not on file    Emotionally abused: Not on file    Physically abused: Not on file    Forced sexual activity: Not on file  Other Topics Concern  . Not on file  Social History Narrative  . Not on file    Current Outpatient Medications on File Prior to Visit  Medication Sig Dispense Refill  . alfuzosin (UROXATRAL) 10 MG 24 hr tablet TK 1 T PO QD  12  . ALPRAZolam (XANAX) 0.5 MG tablet Take 1 tablet (0.5 mg total) by mouth 3 (three) times daily as needed. 90 tablet 1  . atorvastatin (LIPITOR) 10 MG tablet 1 tablet daily 90 tablet 1  . dutasteride (AVODART) 0.5 MG capsule TAKE 1 CAPSULE BY MOUTH EVERY DAY 30 capsule 0  . mirabegron ER (MYRBETRIQ) 50 MG TB24 tablet Take 1 tablet (50 mg total) by mouth daily. 30 tablet 11  . nitroGLYCERIN (NITRODUR - DOSED IN MG/24 HR) 0.2 mg/hr patch Apply 1/4th patch to right shoulder, change daily.  After 1 week can apply to left shoulder as well 30 patch 1  . omeprazole (PRILOSEC) 20 MG capsule Take 1 capsule (20 mg total) by mouth daily. 90 capsule 1  . sertraline (ZOLOFT) 100 MG tablet Take 1 tablet (100 mg total) by mouth daily. 30 tablet 0  . tamsulosin (FLOMAX) 0.4 MG CAPS capsule TAKE 1 CAPSULE(0.4 MG) BY MOUTH TWICE DAILY 60 capsule 0  . eszopiclone (LUNESTA) 1 MG TABS tablet Take 1 tablet (1 mg total) by mouth at bedtime as needed for sleep. Take immediately before bedtime (Patient not taking: Reported on 09/28/2017) 30 tablet 2  . Fluticasone-Salmeterol (ADVAIR) 100-50 MCG/DOSE AEPB Inhale 1 puff into the lungs every 12 (twelve) hours. (Patient not taking: Reported on 09/28/2017) 1 each 3   Current Facility-Administered Medications on File Prior to Visit  Medication Dose Route Frequency Provider Last Rate Last Dose  . 0.9 %  sodium chloride infusion  500 mL Intravenous Continuous Doran Stabler, MD         Allergies  Allergen Reactions  . Simvastatin     REACTION: Rash    Family History  Problem Relation Age of Onset  . Gastric cancer Father        died age 73  . Stroke Father   . Heart disease Mother   . Colon cancer Sister 80  . Other Brother        prostate issues, x 2 brothers    BP 124/82  Pulse 77   Wt 210 lb 3.2 oz (95.3 kg)   SpO2 95%   BMI 30.16 kg/m   REVIEW OF SYSTEMS: Doe and insomnia persist.  PHYSICAL EXAMINATION: VITAL SIGNS:  See vs page GENERAL: no distress Ext: no leg edema LAB/XRAY RESULTS: Lab Results  Component Value Date   CHOL 149 09/28/2017   HDL 51.40 09/28/2017   LDLCALC 75 09/28/2017   TRIG 112.0 09/28/2017   CHOLHDL 3 09/28/2017   Lab Results  Component Value Date   ALT 100 (H) 09/28/2017   AST 54 (H) 09/28/2017   ALKPHOS 36 (L) 09/28/2017   BILITOT 0.7 09/28/2017   IMPRESSION: Dyslipidemia: well-controlled NASH: worse PLAN:  Change fenofibrate to pioglitizone

## 2017-09-29 MED ORDER — PIOGLITAZONE HCL 30 MG PO TABS: 30.0000 mg | ORAL_TABLET | Freq: Every day | ORAL | 3 refills | Status: DC

## 2017-09-30 ENCOUNTER — Encounter: Payer: Self-pay | Admitting: Endocrinology

## 2017-10-01 ENCOUNTER — Other Ambulatory Visit: Payer: Self-pay

## 2017-10-01 MED ORDER — MIRABEGRON ER 50 MG PO TB24: 50.0000 mg | ORAL_TABLET | Freq: Every day | ORAL | 99 refills | Status: DC

## 2017-10-02 ENCOUNTER — Other Ambulatory Visit: Payer: Self-pay | Admitting: Endocrinology

## 2017-10-18 ENCOUNTER — Other Ambulatory Visit: Payer: Self-pay | Admitting: Endocrinology

## 2017-10-19 ENCOUNTER — Other Ambulatory Visit: Payer: Self-pay | Admitting: Endocrinology

## 2017-10-19 MED ORDER — MIRABEGRON ER 50 MG PO TB24: 50.0000 mg | ORAL_TABLET | Freq: Every day | ORAL | 0 refills | Status: DC

## 2017-10-19 MED ORDER — DUTASTERIDE 0.5 MG PO CAPS: ORAL_CAPSULE | ORAL | 0 refills | Status: DC

## 2017-10-19 MED ORDER — TAMSULOSIN HCL 0.4 MG PO CAPS: 0.4000 mg | ORAL_CAPSULE | Freq: Every day | ORAL | 0 refills | Status: DC

## 2017-10-19 MED ORDER — SERTRALINE HCL 100 MG PO TABS: 100.0000 mg | ORAL_TABLET | Freq: Every day | ORAL | 0 refills | Status: DC

## 2017-10-19 MED ORDER — ALPRAZOLAM 0.5 MG PO TABS: 0.5000 mg | ORAL_TABLET | Freq: Three times a day (TID) | ORAL | 0 refills | Status: DC | PRN

## 2017-10-19 MED ORDER — TAMSULOSIN HCL 0.4 MG PO CAPS: 0.4000 mg | ORAL_CAPSULE | Freq: Two times a day (BID) | ORAL | 0 refills | Status: DC

## 2017-10-19 NOTE — Telephone Encounter (Signed)
30 tablets sent and full prescription needs to be done by PCP

## 2017-10-19 NOTE — Telephone Encounter (Signed)
Can you please fill in doctor Ellison's absence

## 2017-10-20 ENCOUNTER — Other Ambulatory Visit: Payer: Self-pay

## 2017-10-29 ENCOUNTER — Encounter: Payer: Self-pay | Admitting: Cardiology

## 2017-10-29 ENCOUNTER — Ambulatory Visit: Payer: BC Managed Care – PPO | Admitting: Cardiology

## 2017-10-29 VITALS — BP 124/78 | HR 72 | Ht 70.0 in | Wt 209.0 lb

## 2017-10-29 DIAGNOSIS — K7581 Nonalcoholic steatohepatitis (NASH): Secondary | ICD-10-CM

## 2017-10-29 DIAGNOSIS — R079 Chest pain, unspecified: Secondary | ICD-10-CM

## 2017-10-29 DIAGNOSIS — R0609 Other forms of dyspnea: Secondary | ICD-10-CM

## 2017-10-29 DIAGNOSIS — R0600 Dyspnea, unspecified: Secondary | ICD-10-CM

## 2017-10-29 HISTORY — DX: Other forms of dyspnea: R06.09

## 2017-10-29 HISTORY — DX: Dyspnea, unspecified: R06.00

## 2017-10-29 NOTE — Patient Instructions (Signed)
Medication Instructions:  Your physician recommends that you continue on your current medications as directed. Please refer to the Current Medication list given to you today.  Labwork: None  Testing/Procedures: Your physician has requested that you have en exercise stress myoview. For further information please visit [email or web address]. Please follow instruction sheet, as given.  Follow-Up: Your physician recommends that you schedule a follow-up appointment in: 1 month  Any Other Special Instructions Will Be Listed Below (If Applicable).     If you need a refill on your cardiac medications before your next appointment, please call your pharmacy.   [place], RN, BSN

## 2017-10-29 NOTE — Progress Notes (Signed)
Cardiology Office Note:    Date:  10/29/2017   ID:  Luis Peterson, DOB Apr 10, 1956, MRN [id number]  PCP:  Renato Shin, MD  Cardiologist:  Jenean Lindau, MD   Referring MD: Renato Shin, MD    ASSESSMENT:    1. DOE (dyspnea on exertion)   2. Chest pain, unspecified type   3. NASH (nonalcoholic steatohepatitis)    PLAN:    In order of problems listed above:  1. Primary prevention stressed with the patient.  Importance of compliance with diet and medication stressed and he vocalized understanding.  In view of concerning symptoms have asked him to take 1 coated aspirin on a daily basis 81 mg and he agrees to do so.  I have asked him to check with his primary care physician if that is okay. 2. In view of her symptoms she will undergo exercise stress Cardiolite.  He knows to go to the nearest emergency room for any significant concerns.   3. Patient will be seen in follow-up appointment in 1 month or earlier if the patient has any concerns    Medication Adjustments/Labs and Tests Ordered: Current medicines are reviewed at length with the patient today.  Concerns regarding medicines are outlined above.  Orders Placed This Encounter  Procedures  . MYOCARDIAL PERFUSION IMAGING   No orders of the defined types were placed in this encounter.    History of Present Illness:    Luis Peterson is a 62 y.o. male who is being seen today for the evaluation of chest pain and shortness of breath on exertion at the request of Renato Shin, MD.  Patient is a pleasant 62 year old male.  He has past medical history of nonalcoholic steatohepatitis.  He mentions to me that he has been having shortness of breath on exertion for the past 6 months.  His effort tolerance is getting lesser and this is of significant concern to the gentleman which is why he is here for an evaluation today.  He occasionally experiences some chest tightness.  No orthopnea or PND.  I specifically asked him if sexual  activity reproduces his symptoms and answered in the negative.  At the time of my evaluation, the patient is alert awake oriented and in no distress.  Past Medical History:  Diagnosis Date  . ANXIETY 01/03/2007   Qualifier: Diagnosis of  By: Loanne Drilling MD, Jacelyn Pi   . 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 Consuella Lose  . Degenerative joint disease of cervical spine   . DYSPNEA 09/01/2007   Qualifier: Diagnosis of  By: Loanne Drilling MD, Jacelyn Pi   . GERD 06/19/2008   Qualifier: Diagnosis of  By: Loanne Drilling MD, Jacelyn Pi   . HEARING LOSS 06/19/2008   Qualifier: Diagnosis of  By: Loanne Drilling MD, Jacelyn Pi   . HYPERCHOLESTEROLEMIA 09/01/2007   Qualifier: Diagnosis of  By: Loanne Drilling MD, Jacelyn Pi   . HYPERGLYCEMIA 09/01/2007   Qualifier: Diagnosis of  By: Loanne Drilling MD, Jacelyn Pi INSOMNIA 06/19/2008   Qualifier: Diagnosis of  By: Loanne Drilling MD, Jone Baseman, SPINE 06/19/2008   Qualifier: Diagnosis of  By: Loanne Drilling MD, Jacelyn Pi Positive H. pylori test 07/1997  . 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 Unspecified disorder of liver 06/19/2008   Qualifier: Diagnosis of  By: Loanne Drilling MD, Jacelyn Pi     Past Surgical History:  Procedure Laterality Date  . COLONOSCOPY    . ELECTROCARDIOGRAM  05/28/2006  . Stress Cardiolite  08/22/2003  . TYMPANOPLASTY  03/1996   Right    Current Medications: Current Meds  Medication Sig  . alfuzosin (UROXATRAL) 10 MG 24 hr tablet TK 1 T PO QD  . ALPRAZolam (XANAX) 0.5 MG tablet Take 1 tablet (0.5 mg total) by mouth 3 (three) times daily as needed.  Marland Kitchen atorvastatin (LIPITOR) 10 MG tablet 1 tablet daily (Patient taking differently: Take 10 mg by mouth daily at 6 PM. 1 tablet daily)  . dutasteride (AVODART) 0.5 MG capsule TAKE 1 CAPSULE BY MOUTH EVERY DAY  . eszopiclone (LUNESTA) 1 MG TABS tablet Take 1 tablet (1 mg total) by mouth at bedtime as needed for sleep. Take immediately before bedtime  . mirabegron ER (MYRBETRIQ) 50 MG TB24 tablet  Take 1 tablet (50 mg total) by mouth daily.  . nitroGLYCERIN (NITRODUR - DOSED IN MG/24 HR) 0.2 mg/hr patch Apply 1/4th patch to right shoulder, change daily.  After 1 week can apply to left shoulder as well  . omeprazole (PRILOSEC) 20 MG capsule Take 1 capsule (20 mg total) by mouth daily.  . pioglitazone (ACTOS) 30 MG tablet Take 1 tablet (30 mg total) by mouth daily.  . sertraline (ZOLOFT) 100 MG tablet TAKE 1 TABLET(100 MG) BY MOUTH DAILY  . tamsulosin (FLOMAX) 0.4 MG CAPS capsule Take 1 capsule (0.4 mg total) by mouth daily. (Patient taking differently: Take 0.8 mg by mouth daily. )   Current Facility-Administered Medications for the 10/29/17 encounter (Office Visit) with Kailer Heindel, Reita Cliche, MD  Medication  . 0.9 %  sodium chloride infusion     Allergies:   Simvastatin   Social History   Socioeconomic History  . Marital status: Married    Spouse name: Not on file  . Number of children: 1  . Years of education: Not on file  . Highest education level: Not on file  Occupational History  . Occupation: Secretary/administrator Professor     Employer: [employer]  Social Needs  . Financial resource strain: Not on file  . Food insecurity:    Worry: Not on file    Inability: Not on file  . Transportation needs:    Medical: Not on file    Non-medical: Not on file  Tobacco Use  . Smoking status: Former Smoker    Years: 5.00    Types: Cigarettes  . Smokeless tobacco: Never Used  Substance and Sexual Activity  . Alcohol use: Yes    Comment: social  . Drug use: No  . Sexual activity: Not on file  Lifestyle  . Physical activity:    Days per week: Not on file    Minutes per session: Not on file  . Stress: Not on file  Relationships  . Social connections:    Talks on phone: Not on file    Gets together: Not on file    Attends religious service: Not on file    Active member of club or organization: Not on file    Attends meetings of clubs or organizations: Not on file    Relationship status: Not on  file  Other Topics Concern  . Not on file  Social History Narrative  . Not on file     Family History: The patient's family history includes Colon cancer (age of onset: 58) in his sister; Gastric cancer in his father; Heart disease in his mother; Other in his brother; Stroke in his  father.  ROS:   Please see the history of present illness.    All other systems reviewed and are negative.  EKGs/Labs/Other Studies Reviewed:    The following studies were reviewed today: EKG reveals sinus rhythm and nonspecific ST-T changes.   Recent Labs: 09/28/2017: ALT 100; BUN 12; Creatinine, Ser 0.83; Hemoglobin 14.3; Platelets 186.0; Potassium 4.0; Sodium 138; TSH 0.54  Recent Lipid Panel    Component Value Date/Time   CHOL 149 09/28/2017 1427   TRIG 112.0 09/28/2017 1427   TRIG 160 (H) 05/21/2006 0911   HDL 51.40 09/28/2017 1427   CHOLHDL 3 09/28/2017 1427   VLDL 22.4 09/28/2017 1427   LDLCALC 75 09/28/2017 1427    Physical Exam:    VS:  BP 124/78 (BP Location: Left Arm, Patient Position: Sitting, Cuff Size: Normal)   Pulse 72   Ht 5' 10"  (1.778 m)   Wt 209 lb (94.8 kg)   SpO2 98%   BMI 29.99 kg/m     Wt Readings from Last 3 Encounters:  10/29/17 209 lb (94.8 kg)  09/28/17 210 lb 3.2 oz (95.3 kg)  09/25/16 202 lb 3.2 oz (91.7 kg)     GEN: Patient is in no acute distress HEENT: Normal NECK: No JVD; No carotid bruits LYMPHATICS: No lymphadenopathy CARDIAC: S1 S2 regular, 2/6 systolic murmur at the apex. RESPIRATORY:  Clear to auscultation without rales, wheezing or rhonchi  ABDOMEN: Soft, non-tender, non-distended MUSCULOSKELETAL:  No edema; No deformity  SKIN: Warm and dry NEUROLOGIC:  Alert and oriented x 3 PSYCHIATRIC:  Normal affect    Signed, Jenean Lindau, MD  10/29/2017 5:08 PM    [place]

## 2017-10-31 ENCOUNTER — Other Ambulatory Visit: Payer: Self-pay | Admitting: Endocrinology

## 2017-11-01 ENCOUNTER — Other Ambulatory Visit: Payer: Self-pay | Admitting: Endocrinology

## 2017-11-01 MED ORDER — ATORVASTATIN CALCIUM 10 MG PO TABS: ORAL_TABLET | ORAL | 1 refills | Status: DC

## 2017-11-01 MED ORDER — ALPRAZOLAM 0.5 MG PO TABS: 0.5000 mg | ORAL_TABLET | Freq: Three times a day (TID) | ORAL | 0 refills | Status: DC | PRN

## 2017-11-02 ENCOUNTER — Telehealth (HOSPITAL_COMMUNITY): Payer: Self-pay

## 2017-11-02 NOTE — Telephone Encounter (Signed)
Encounter complete. 

## 2017-11-04 ENCOUNTER — Ambulatory Visit (HOSPITAL_COMMUNITY)
Admission: RE | Admit: 2017-11-04 | Discharge: 2017-11-04 | Disposition: A | Discharge status: Home / Self Care | Payer: BC Managed Care – PPO | Source: Ambulatory Visit | Attending: Cardiovascular Disease | Admitting: Cardiovascular Disease

## 2017-11-04 DIAGNOSIS — Z87891 Personal history of nicotine dependence: Secondary | ICD-10-CM | POA: Insufficient documentation

## 2017-11-04 DIAGNOSIS — Z8249 Family history of ischemic heart disease and other diseases of the circulatory system: Secondary | ICD-10-CM | POA: Diagnosis not present

## 2017-11-04 DIAGNOSIS — K219 Gastro-esophageal reflux disease without esophagitis: Secondary | ICD-10-CM | POA: Diagnosis not present

## 2017-11-04 DIAGNOSIS — I2510 Atherosclerotic heart disease of native coronary artery without angina pectoris: Secondary | ICD-10-CM | POA: Insufficient documentation

## 2017-11-04 DIAGNOSIS — R0609 Other forms of dyspnea: Secondary | ICD-10-CM | POA: Diagnosis not present

## 2017-11-04 DIAGNOSIS — R079 Chest pain, unspecified: Secondary | ICD-10-CM | POA: Insufficient documentation

## 2017-11-04 LAB — MYOCARDIAL PERFUSION IMAGING
CHL CUP MPHR: 160 {beats}/min
CHL CUP NUCLEAR SSS: 2
CHL RATE OF PERCEIVED EXERTION: 17
CSEPED: 7 min
CSEPEDS: 50 s
CSEPEW: 9.8 METS
LV dias vol: 99 mL (ref 62–150)
LV sys vol: 31 mL
NUC STRESS TID: 0.8
Peak HR: 162 {beats}/min
Percent HR: 101 %
Rest HR: 65 {beats}/min
SDS: 2
SRS: 0

## 2017-11-04 MED ORDER — TECHNETIUM TC 99M TETROFOSMIN IV KIT
30.6000 | PACK | Freq: Once | INTRAVENOUS | Status: AC | PRN
Start: 2017-11-04 — End: 2017-11-04
  Administered 2017-11-04: 30.6 via INTRAVENOUS
  Filled 2017-11-04: qty 31

## 2017-11-04 MED ORDER — TECHNETIUM TC 99M TETROFOSMIN IV KIT
10.1000 | PACK | Freq: Once | INTRAVENOUS | Status: AC | PRN
  Administered 2017-11-04: 10.1 via INTRAVENOUS
  Filled 2017-11-04: qty 11

## 2017-11-10 ENCOUNTER — Encounter: Payer: Self-pay | Admitting: Cardiology

## 2017-11-10 ENCOUNTER — Encounter: Payer: Self-pay | Admitting: Endocrinology

## 2017-11-12 ENCOUNTER — Telehealth: Payer: Self-pay | Admitting: Endocrinology

## 2017-11-12 DIAGNOSIS — E7800 Pure hypercholesterolemia, unspecified: Secondary | ICD-10-CM

## 2017-11-12 NOTE — Telephone Encounter (Signed)
I called & spoke with patient. I made lab appointment for next Tuesday.

## 2017-11-12 NOTE — Telephone Encounter (Signed)
Ok, I ordered.  Please do fasting.

## 2017-11-12 NOTE — Telephone Encounter (Signed)
-----   Message from Dorna Leitz, [place] sent at 11/12/2017  7:52 AM EDT ----- Regarding: Labs Below is patient's my chart message. Can we order requested labs & I will call to make lab appointment.   ----- Message from [place], Generic sent at 11/10/2017 9:54 PM EDT -----I have a question about HEMOGLOBIN A1C resulted on 09/28/17, 3:45 PM. I was put on Actos 75m instead of Fenofibrate, since you mentioned that it might be the cause of liver issues.  You wanted me to come back for a follow up blood test in about two months to see if that worked.  Can you send the blood work order to your lab and let me know when I should come to do the test.  Many thanks [facility]

## 2017-11-15 ENCOUNTER — Other Ambulatory Visit: Payer: Self-pay | Admitting: Endocrinology

## 2017-11-15 MED ORDER — DUTASTERIDE 0.5 MG PO CAPS: ORAL_CAPSULE | ORAL | 0 refills | Status: DC

## 2017-11-15 MED ORDER — SERTRALINE HCL 100 MG PO TABS: 100.0000 mg | ORAL_TABLET | Freq: Every day | ORAL | 0 refills | Status: DC

## 2017-11-15 MED ORDER — TAMSULOSIN HCL 0.4 MG PO CAPS: 0.4000 mg | ORAL_CAPSULE | Freq: Every day | ORAL | 0 refills | Status: DC

## 2017-11-15 NOTE — Telephone Encounter (Signed)
Can you refill xanax Dr. Cordelia Pen absence?

## 2017-11-16 ENCOUNTER — Encounter: Payer: Self-pay | Admitting: Endocrinology

## 2017-11-16 ENCOUNTER — Other Ambulatory Visit (INDEPENDENT_AMBULATORY_CARE_PROVIDER_SITE_OTHER): Payer: BC Managed Care – PPO

## 2017-11-16 DIAGNOSIS — R739 Hyperglycemia, unspecified: Secondary | ICD-10-CM

## 2017-11-16 DIAGNOSIS — K7581 Nonalcoholic steatohepatitis (NASH): Secondary | ICD-10-CM

## 2017-11-16 LAB — LIPID PANEL
CHOLESTEROL: 162 mg/dL (ref 0–200)
HDL: 53.2 mg/dL (ref >39.00)
LDL Cholesterol: 70 mg/dL (ref 0–99)
NonHDL: 108.64
Total CHOL/HDL Ratio: 3
Triglycerides: 194 mg/dL — ABNORMAL HIGH (ref 0.0–149.0)
VLDL: 38.8 mg/dL (ref 0.0–40.0)

## 2017-11-16 LAB — HEPATIC FUNCTION PANEL
ALBUMIN: 4.5 g/dL (ref 3.5–5.2)
ALT: 59 U/L — AB (ref 0–53)
AST: 34 U/L (ref 0–37)
Alkaline Phosphatase: 37 U/L — ABNORMAL LOW (ref 39–117)
Bilirubin, Direct: 0.1 mg/dL (ref 0.0–0.3)
TOTAL PROTEIN: 7.4 g/dL (ref 6.0–8.3)
Total Bilirubin: 0.7 mg/dL (ref 0.2–1.2)

## 2017-11-18 ENCOUNTER — Other Ambulatory Visit: Payer: Self-pay | Admitting: Endocrinology

## 2017-11-18 DIAGNOSIS — R739 Hyperglycemia, unspecified: Secondary | ICD-10-CM

## 2017-11-18 DIAGNOSIS — E7800 Pure hypercholesterolemia, unspecified: Secondary | ICD-10-CM

## 2017-12-02 ENCOUNTER — Encounter (INDEPENDENT_AMBULATORY_CARE_PROVIDER_SITE_OTHER): Payer: Self-pay

## 2017-12-04 ENCOUNTER — Other Ambulatory Visit: Payer: Self-pay | Admitting: Endocrinology

## 2017-12-07 ENCOUNTER — Encounter: Payer: Self-pay | Admitting: Cardiology

## 2017-12-07 ENCOUNTER — Ambulatory Visit (INDEPENDENT_AMBULATORY_CARE_PROVIDER_SITE_OTHER): Payer: BC Managed Care – PPO | Admitting: Cardiology

## 2017-12-07 VITALS — BP 118/70 | HR 79 | Ht 70.0 in | Wt 212.4 lb

## 2017-12-07 DIAGNOSIS — R0609 Other forms of dyspnea: Secondary | ICD-10-CM

## 2017-12-07 DIAGNOSIS — E7800 Pure hypercholesterolemia, unspecified: Secondary | ICD-10-CM | POA: Diagnosis not present

## 2017-12-07 NOTE — Progress Notes (Signed)
Cardiology Office Note:    Date:  12/07/2017   ID:  Center WENZLICK, DOB 1955-06-14, MRN [id number]  PCP:  Renato Shin, MD  Cardiologist:  Jenean Lindau, MD   Referring MD: Renato Shin, MD    ASSESSMENT:    1. HYPERCHOLESTEROLEMIA   2. DOE (dyspnea on exertion)    PLAN:    In order of problems listed above:  1. Primary prevention stressed with the patient.  Importance of compliance with diet and medication stressed and he vocalized understanding.  He plans to begin a regular exercise program.  I mentioned to him that we will recheck his lipids in 6 months from now when he comes for a follow-up appointment.  If he has any issues with shortness of breath that they are continuing I told him to get in touch with me for further evaluation. 2. Patient will be seen in follow-up appointment in 6 months or earlier if the patient has any concerns    Medication Adjustments/Labs and Tests Ordered: Current medicines are reviewed at length with the patient today.  Concerns regarding medicines are outlined above.  No orders of the defined types were placed in this encounter.  No orders of the defined types were placed in this encounter.    Chief Complaint  Patient presents with  . 1 month follow up     History of Present Illness:    Luis Peterson is a 63 y.o. male.  Patient was evaluated for shortness of breath.  He also has history of dyslipidemia.  He mentions to me that his tests were fine and now he starts exercising some on a regular basis.  With this he has no problems.  No chest pain orthopnea or PND.  He still mentions to me that he is not fit and not is committed to regular exercise.  At the time of my evaluation, the patient is alert awake oriented and in no distress.  Past Medical History:  Diagnosis Date  . ANXIETY 01/03/2007   Qualifier: Diagnosis of  By: Loanne Drilling MD, Jacelyn Pi   . 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 Consuella Lose  . Degenerative  joint disease of cervical spine   . DYSPNEA 09/01/2007   Qualifier: Diagnosis of  By: Loanne Drilling MD, Jacelyn Pi   . GERD 06/19/2008   Qualifier: Diagnosis of  By: Loanne Drilling MD, Jacelyn Pi   . HEARING LOSS 06/19/2008   Qualifier: Diagnosis of  By: Loanne Drilling MD, Jacelyn Pi   . HYPERCHOLESTEROLEMIA 09/01/2007   Qualifier: Diagnosis of  By: Loanne Drilling MD, Jacelyn Pi   . HYPERGLYCEMIA 09/01/2007   Qualifier: Diagnosis of  By: Loanne Drilling MD, Jacelyn Pi INSOMNIA 06/19/2008   Qualifier: Diagnosis of  By: Loanne Drilling MD, Jone Baseman, SPINE 06/19/2008   Qualifier: Diagnosis of  By: Loanne Drilling MD, Jacelyn Pi Positive H. pylori test 07/1997  . 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 Unspecified disorder of liver 06/19/2008   Qualifier: Diagnosis of  By: Loanne Drilling MD, Jacelyn Pi     Past Surgical History:  Procedure Laterality Date  . COLONOSCOPY    . ELECTROCARDIOGRAM  05/28/2006  . Stress Cardiolite  08/22/2003  . TYMPANOPLASTY  03/1996   Right    Current Medications: Current Meds  Medication Sig  . alfuzosin (UROXATRAL) 10 MG 24 hr tablet TK 1 T PO QD  .  ALPRAZolam (XANAX) 0.5 MG tablet Take 1 tablet (0.5 mg total) by mouth 3 (three) times daily as needed.  Marland Kitchen atorvastatin (LIPITOR) 10 MG tablet 1 tablet daily  . dutasteride (AVODART) 0.5 MG capsule TAKE 1 CAPSULE BY MOUTH EVERY DAY  . eszopiclone (LUNESTA) 1 MG TABS tablet Take 1 tablet (1 mg total) by mouth at bedtime as needed for sleep. Take immediately before bedtime  . mirabegron ER (MYRBETRIQ) 50 MG TB24 tablet Take 1 tablet (50 mg total) by mouth daily.  . nitroGLYCERIN (NITRODUR - DOSED IN MG/24 HR) 0.2 mg/hr patch Apply 1/4th patch to right shoulder, change daily.  After 1 week can apply to left shoulder as well  . omeprazole (PRILOSEC) 20 MG capsule Take 1 capsule (20 mg total) by mouth daily.  . pioglitazone (ACTOS) 30 MG tablet Take 1 tablet (30 mg total) by mouth daily.  . sertraline (ZOLOFT) 100 MG tablet Take 1 tablet (100 mg  total) by mouth daily.  . tamsulosin (FLOMAX) 0.4 MG CAPS capsule Take 1 capsule (0.4 mg total) by mouth daily.   Current Facility-Administered Medications for the 12/07/17 encounter (Office Visit) with Greer Wainright, Reita Cliche, MD  Medication  . 0.9 %  sodium chloride infusion     Allergies:   Simvastatin   Social History   Socioeconomic History  . Marital status: Married    Spouse name: Not on file  . Number of children: 1  . Years of education: Not on file  . Highest education level: Not on file  Occupational History  . Occupation: Secretary/administrator Professor     Employer: [employer]  . Financial resource strain: Not on file  . Food insecurity:    Worry: Not on file    Inability: Not on file  . Transportation needs:    Medical: Not on file    Non-medical: Not on file  Tobacco Use  . Smoking status: Former Smoker    Years: 5.00    Types: Cigarettes  . Smokeless tobacco: Never Used  Substance and Sexual Activity  . Alcohol use: Yes    Comment: social  . Drug use: No  . Sexual activity: Not on file  Lifestyle  . Physical activity:    Days per week: Not on file    Minutes per session: Not on file  . Stress: Not on file  Relationships  . Social connections:    Talks on phone: Not on file    Gets together: Not on file    Attends religious service: Not on file    Active member of club or organization: Not on file    Attends meetings of clubs or organizations: Not on file    Relationship status: Not on file  Other Topics Concern  . Not on file  Social History Narrative  . Not on file     Family History: The patient's family history includes Colon cancer (age of onset: 1) in his sister; Gastric cancer in his father; Heart disease in his mother; Other in his brother; Stroke in his father.  ROS:   Please see the history of present illness.    All other systems reviewed and are negative.  EKGs/Labs/Other Studies Reviewed:    The following studies were  reviewed today: I discussed my findings with the patient especially stress test results at extensive length   Recent Labs: 09/28/2017: BUN 12; Creatinine, Ser 0.83; Hemoglobin 14.3; Platelets 186.0; Potassium 4.0; Sodium 138; TSH 0.54 11/16/2017: ALT 59  Recent Lipid  Panel    Component Value Date/Time   CHOL 162 11/16/2017 1046   TRIG 194.0 (H) 11/16/2017 1046   TRIG 160 (H) 05/21/2006 0911   HDL 53.20 11/16/2017 1046   CHOLHDL 3 11/16/2017 1046   VLDL 38.8 11/16/2017 1046   LDLCALC 70 11/16/2017 1046    Physical Exam:    VS:  BP 118/70   Pulse 79   Ht 5' 10"  (1.778 m)   Wt 212 lb 6.4 oz (96.3 kg)   SpO2 96%   BMI 30.48 kg/m     Wt Readings from Last 3 Encounters:  12/07/17 212 lb 6.4 oz (96.3 kg)  11/04/17 209 lb (94.8 kg)  10/29/17 209 lb (94.8 kg)     GEN: Patient is in no acute distress HEENT: Normal NECK: No JVD; No carotid bruits LYMPHATICS: No lymphadenopathy CARDIAC: Hear sounds regular, 2/6 systolic murmur at the apex. RESPIRATORY:  Clear to auscultation without rales, wheezing or rhonchi  ABDOMEN: Soft, non-tender, non-distended MUSCULOSKELETAL:  No edema; No deformity  SKIN: Warm and dry NEUROLOGIC:  Alert and oriented x 3 PSYCHIATRIC:  Normal affect   Signed, Jenean Lindau, MD  12/07/2017 4:31 PM    [facility]

## 2017-12-07 NOTE — Patient Instructions (Signed)
Medication Instructions:  Your physician recommends that you continue on your current medications as directed. Please refer to the Current Medication list given to you today.   Labwork: None  Testing/Procedures: None  Follow-Up: Your physician wants you to follow-up in: 6 months. You will receive a reminder letter in the mail two months in advance. If you don't receive a letter, please call our office to schedule the follow-up appointment.   If you need a refill on your cardiac medications before your next appointment, please call your pharmacy.   Thank you for choosing [facility]! Robyne Peers, RN [phone]

## 2017-12-13 ENCOUNTER — Ambulatory Visit (INDEPENDENT_AMBULATORY_CARE_PROVIDER_SITE_OTHER): Payer: Self-pay | Admitting: Family Medicine

## 2017-12-20 ENCOUNTER — Other Ambulatory Visit: Payer: Self-pay | Admitting: Endocrinology

## 2017-12-21 ENCOUNTER — Other Ambulatory Visit: Payer: Self-pay | Admitting: Endocrinology

## 2017-12-21 MED ORDER — SERTRALINE HCL 100 MG PO TABS: 100.0000 mg | ORAL_TABLET | Freq: Every day | ORAL | 0 refills | Status: DC

## 2017-12-21 MED ORDER — ALPRAZOLAM 0.5 MG PO TABS: 0.5000 mg | ORAL_TABLET | Freq: Three times a day (TID) | ORAL | 0 refills | Status: DC | PRN

## 2017-12-21 MED ORDER — DUTASTERIDE 0.5 MG PO CAPS: ORAL_CAPSULE | ORAL | 0 refills | Status: DC

## 2017-12-21 MED ORDER — TAMSULOSIN HCL 0.4 MG PO CAPS: 0.4000 mg | ORAL_CAPSULE | Freq: Every day | ORAL | 0 refills | Status: DC

## 2017-12-22 ENCOUNTER — Encounter: Payer: Self-pay | Admitting: Endocrinology

## 2017-12-23 ENCOUNTER — Other Ambulatory Visit: Payer: Self-pay | Admitting: Endocrinology

## 2017-12-23 MED ORDER — ESZOPICLONE 2 MG PO TABS: 2.0000 mg | ORAL_TABLET | Freq: Every evening | ORAL | 0 refills | Status: DC | PRN

## 2018-01-16 ENCOUNTER — Other Ambulatory Visit: Payer: Self-pay | Admitting: Endocrinology

## 2018-01-16 ENCOUNTER — Other Ambulatory Visit: Payer: Self-pay

## 2018-01-17 MED ORDER — ALPRAZOLAM 0.5 MG PO TABS: 0.5000 mg | ORAL_TABLET | Freq: Three times a day (TID) | ORAL | 0 refills | Status: DC | PRN

## 2018-01-17 MED ORDER — ESZOPICLONE 2 MG PO TABS: 2.0000 mg | ORAL_TABLET | Freq: Every evening | ORAL | 0 refills | Status: DC | PRN

## 2018-01-17 NOTE — Telephone Encounter (Signed)
Luis Peterson pt

## 2018-01-18 MED ORDER — TAMSULOSIN HCL 0.4 MG PO CAPS: 0.4000 mg | ORAL_CAPSULE | Freq: Every day | ORAL | 0 refills | Status: DC

## 2018-01-18 MED ORDER — SERTRALINE HCL 100 MG PO TABS: 100.0000 mg | ORAL_TABLET | Freq: Every day | ORAL | 0 refills | Status: DC

## 2018-01-18 MED ORDER — DUTASTERIDE 0.5 MG PO CAPS: ORAL_CAPSULE | ORAL | 0 refills | Status: DC

## 2018-02-01 ENCOUNTER — Other Ambulatory Visit: Payer: Self-pay | Admitting: Endocrinology

## 2018-02-24 ENCOUNTER — Other Ambulatory Visit: Payer: Self-pay

## 2018-02-24 ENCOUNTER — Other Ambulatory Visit: Payer: Self-pay | Admitting: Endocrinology

## 2018-02-25 MED ORDER — ALPRAZOLAM 0.5 MG PO TABS: 0.5000 mg | ORAL_TABLET | Freq: Three times a day (TID) | ORAL | 2 refills | Status: DC | PRN

## 2018-02-25 MED ORDER — MIRABEGRON ER 50 MG PO TB24: 50.0000 mg | ORAL_TABLET | Freq: Every day | ORAL | 0 refills | Status: DC

## 2018-02-25 MED ORDER — ESZOPICLONE 2 MG PO TABS: 2.0000 mg | ORAL_TABLET | Freq: Every evening | ORAL | 2 refills | Status: DC | PRN

## 2018-02-25 NOTE — Telephone Encounter (Signed)
I sent refills

## 2018-02-25 NOTE — Telephone Encounter (Signed)
Please advise on these rx refills

## 2018-03-03 ENCOUNTER — Encounter: Payer: Self-pay | Admitting: Endocrinology

## 2018-03-03 MED ORDER — TAMSULOSIN HCL 0.4 MG PO CAPS: 0.4000 mg | ORAL_CAPSULE | Freq: Every day | ORAL | 0 refills | Status: DC

## 2018-03-03 MED ORDER — DUTASTERIDE 0.5 MG PO CAPS: ORAL_CAPSULE | ORAL | 0 refills | Status: DC

## 2018-03-03 MED ORDER — SERTRALINE HCL 100 MG PO TABS: 100.0000 mg | ORAL_TABLET | Freq: Every day | ORAL | 0 refills | Status: DC

## 2018-03-03 NOTE — Telephone Encounter (Signed)
Please refer to pt request for refill. It appears BOTH you and Dr. Dwyane Dee are prescribing medications for this pt. Please advise how to proceed. Thanks

## 2018-03-04 ENCOUNTER — Other Ambulatory Visit: Payer: Self-pay | Admitting: Endocrinology

## 2018-03-04 MED ORDER — ALPRAZOLAM 0.5 MG PO TABS: 0.5000 mg | ORAL_TABLET | Freq: Three times a day (TID) | ORAL | 1 refills | Status: DC | PRN

## 2018-03-18 ENCOUNTER — Ambulatory Visit: Payer: BC Managed Care – PPO | Admitting: Family Medicine

## 2018-03-28 ENCOUNTER — Other Ambulatory Visit: Payer: Self-pay | Admitting: Endocrinology

## 2018-03-29 MED ORDER — TAMSULOSIN HCL 0.4 MG PO CAPS: 0.4000 mg | ORAL_CAPSULE | Freq: Every day | ORAL | 0 refills | Status: DC

## 2018-03-29 MED ORDER — DUTASTERIDE 0.5 MG PO CAPS: ORAL_CAPSULE | ORAL | 0 refills | Status: DC

## 2018-03-29 MED ORDER — SERTRALINE HCL 100 MG PO TABS: 100.0000 mg | ORAL_TABLET | Freq: Every day | ORAL | 0 refills | Status: DC

## 2018-04-04 ENCOUNTER — Other Ambulatory Visit: Payer: Self-pay | Admitting: Endocrinology

## 2018-04-15 ENCOUNTER — Ambulatory Visit: Payer: BC Managed Care – PPO | Admitting: Internal Medicine

## 2018-04-15 ENCOUNTER — Encounter: Payer: Self-pay | Admitting: Internal Medicine

## 2018-04-15 VITALS — BP 120/70 | HR 88 | Temp 98.3°F | Wt 214.1 lb

## 2018-04-15 DIAGNOSIS — R739 Hyperglycemia, unspecified: Secondary | ICD-10-CM

## 2018-04-15 DIAGNOSIS — G4700 Insomnia, unspecified: Secondary | ICD-10-CM | POA: Diagnosis not present

## 2018-04-15 DIAGNOSIS — E7800 Pure hypercholesterolemia, unspecified: Secondary | ICD-10-CM

## 2018-04-15 LAB — POCT GLYCOSYLATED HEMOGLOBIN (HGB A1C): Hemoglobin A1C: 5.7 % — AB (ref 4.0–5.6)

## 2018-04-15 MED ORDER — ESZOPICLONE 2 MG PO TABS: 3.0000 mg | ORAL_TABLET | Freq: Every evening | ORAL | 0 refills | Status: DC | PRN

## 2018-04-15 NOTE — Patient Instructions (Addendum)
-It was nice meeting you today.  -We will increase your Lunesta to 3 mg at bedtime as needed for sleep.  -Please remember the sleep hygiene techniques that we discussed during your visit.  -I highly recommend scheduling an appointment with our counselor Dennison Bulla to discuss your challenges with insomnia and how your depression and anxiety may be playing into it.  -Please schedule follow-up with me in July 2020 for your annual physical.  Please come fasting to that visit.  Insomnia Insomnia is a sleep disorder that makes it difficult to fall asleep or to stay asleep. Insomnia can cause tiredness (fatigue), low energy, difficulty concentrating, mood swings, and poor performance at work or school. There are three different ways to classify insomnia:  Difficulty falling asleep.  Difficulty staying asleep.  Waking up too early in the morning.  Any type of insomnia can be long-term (chronic) or short-term (acute). Both are common. Short-term insomnia usually lasts for three months or less. Chronic insomnia occurs at least three times a week for longer than three months. What are the causes? Insomnia may be caused by another condition, situation, or substance, such as:  Anxiety.  Certain medicines.  Gastroesophageal reflux disease (GERD) or other gastrointestinal conditions.  Asthma or other breathing conditions.  Restless legs syndrome, sleep apnea, or other sleep disorders.  Chronic pain.  Menopause. This may include hot flashes.  Stroke.  Abuse of alcohol, tobacco, or illegal drugs.  Depression.  Caffeine.  Neurological disorders, such as Alzheimer disease.  An overactive thyroid (hyperthyroidism).  The cause of insomnia may not be known. What increases the risk? Risk factors for insomnia include:  Gender. Women are more commonly affected than men.  Age. Insomnia is more common as you get older.  Stress. This may involve your professional or personal  life.  Income. Insomnia is more common in people with lower income.  Lack of exercise.  Irregular work schedule or night shifts.  Traveling between different time zones.  What are the signs or symptoms? If you have insomnia, trouble falling asleep or trouble staying asleep is the main symptom. This may lead to other symptoms, such as:  Feeling fatigued.  Feeling nervous about going to sleep.  Not feeling rested in the morning.  Having trouble concentrating.  Feeling irritable, anxious, or depressed.  How is this treated? Treatment for insomnia depends on the cause. If your insomnia is caused by an underlying condition, treatment will focus on addressing the condition. Treatment may also include:  Medicines to help you sleep.  Counseling or therapy.  Lifestyle adjustments.  Follow these instructions at home:  Take medicines only as directed by your health care provider.  Keep regular sleeping and waking hours. Avoid naps.  Keep a sleep diary to help you and your health care provider figure out what could be causing your insomnia. Include: ? When you sleep. ? When you wake up during the night. ? How well you sleep. ? How rested you feel the next day. ? Any side effects of medicines you are taking. ? What you eat and drink.  Make your bedroom a comfortable place where it is easy to fall asleep: ? Put up shades or special blackout curtains to block light from outside. ? Use a white noise machine to block noise. ? Keep the temperature cool.  Exercise regularly as directed by your health care provider. Avoid exercising right before bedtime.  Use relaxation techniques to manage stress. Ask your health care provider to suggest some  techniques that may work well for you. These may include: ? Breathing exercises. ? Routines to release muscle tension. ? Visualizing peaceful scenes.  Cut back on alcohol, caffeinated beverages, and cigarettes, especially close to bedtime.  These can disrupt your sleep.  Do not overeat or eat spicy foods right before bedtime. This can lead to digestive discomfort that can make it hard for you to sleep.  Limit screen use before bedtime. This includes: ? Watching TV. ? Using your smartphone, tablet, and computer.  Stick to a routine. This can help you fall asleep faster. Try to do a quiet activity, brush your teeth, and go to bed at the same time each night.  Get out of bed if you are still awake after 15 minutes of trying to sleep. Keep the lights down, but try reading or doing a quiet activity. When you feel sleepy, go back to bed.  Make sure that you drive carefully. Avoid driving if you feel very sleepy.  Keep all follow-up appointments as directed by your health care provider. This is important. Contact a health care provider if:  You are tired throughout the day or have trouble in your daily routine due to sleepiness.  You continue to have sleep problems or your sleep problems get worse. Get help right away if:  You have serious thoughts about hurting yourself or someone else. This information is not intended to replace advice given to you by your health care provider. Make sure you discuss any questions you have with your health care provider. Document Released: 04/24/2000 Document Revised: 09/27/2015 Document Reviewed: 01/26/2014 Elsevier Interactive Patient Education  [employer].

## 2018-04-15 NOTE — Progress Notes (Signed)
New Patient Office Visit     CC/Reason for Visit: Establish care, follow-up chronic medical conditions Previous PCP: Renato Shin   HPI: Luis Peterson is a 62 y.o. male who is coming in today for the above mentioned reasons.  He is due for his annual wellness exam in July 2020.  Past Medical History is significant for: Depression, anxiety, insomnia, hyperlipidemia, BPH.  His mood issues have been stable on Zoloft for years.  He has not seen a psychiatrist/counselor.  He has been having severe issues with insomnia.  He starts winding down by 11 PM, it can take him sometimes an hour and a half before he decides to take a Lunesta (of which he has been prescribed 2 mg).  He wakes up several times with nocturia due to his BPH.  Sometimes in the morning he is sleepy.  He does not eat or drink anything past 9 9:30 at night.  He works as a professor of IT, his working hours are not regular, sometimes he has a late evening class.  He does have a TV in his bedroom but does not watch it routinely.  He has been on Actos but no diagnosis of diabetes that I can see in his chart.   Past Medical/Surgical History: Past Medical History:  Diagnosis Date  . ANXIETY 01/03/2007   Qualifier: Diagnosis of  By: Loanne Drilling MD, Jacelyn Pi   . 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 Consuella Lose  . Degenerative joint disease of cervical spine   . Depression   . DYSPNEA 09/01/2007   Qualifier: Diagnosis of  By: Loanne Drilling MD, Jacelyn Pi   . GERD 06/19/2008   Qualifier: Diagnosis of  By: Loanne Drilling MD, Jacelyn Pi   . HEARING LOSS 06/19/2008   Qualifier: Diagnosis of  By: Loanne Drilling MD, Jacelyn Pi   . HYPERCHOLESTEROLEMIA 09/01/2007   Qualifier: Diagnosis of  By: Loanne Drilling MD, Jacelyn Pi INSOMNIA 06/19/2008   Qualifier: Diagnosis of  By: Loanne Drilling MD, Jone Baseman, SPINE 06/19/2008   Qualifier: Diagnosis of  By: Loanne Drilling MD, Jacelyn Pi Positive H. pylori test 07/1997  . 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 COLONOSCOPY    . ELECTROCARDIOGRAM  05/28/2006  . Stress Cardiolite  08/22/2003  . TYMPANOPLASTY  03/1996   Right    Social History:  reports that he has quit smoking. His smoking use included cigarettes. He quit after 5.00 years of use. He has never used smokeless tobacco. He reports that he drinks alcohol. He reports that he does not use drugs.  Allergies: Allergies  Allergen Reactions  . Simvastatin     REACTION: Rash    Family History:  Family History  Problem Relation Age of Onset  . Gastric cancer Father        died age 84  . Stroke Father   . Heart disease Mother   . Colon cancer Sister 46  . Other Brother        prostate issues, x 2 brothers     Current Outpatient Medications:  .  alfuzosin (UROXATRAL) 10 MG 24 hr tablet, TK 1 T PO QD, Disp: , Rfl: 12 .  ALPRAZolam (XANAX) 0.5 MG tablet, Take 1 tablet (0.5 mg total) by mouth 3 (three) times daily as needed., Disp: 90 tablet, Rfl:  1 .  atorvastatin (LIPITOR) 10 MG tablet, 1 tablet daily, Disp: 90 tablet, Rfl: 1 .  dutasteride (AVODART) 0.5 MG capsule, TAKE 1 CAPSULE BY MOUTH EVERY DAY, Disp: 30 capsule, Rfl: 0 .  eszopiclone (LUNESTA) 2 MG TABS tablet, Take 1.5 tablets (3 mg total) by mouth at bedtime as needed for sleep. Take immediately before bedtime, Disp: 45 tablet, Rfl: 0 .  mirabegron ER (MYRBETRIQ) 50 MG TB24 tablet, Take 1 tablet (50 mg total) by mouth daily., Disp: 30 tablet, Rfl: 0 .  omeprazole (PRILOSEC) 20 MG capsule, Take 1 capsule (20 mg total) by mouth daily., Disp: 90 capsule, Rfl: 1 .  sertraline (ZOLOFT) 100 MG tablet, Take 1 tablet (100 mg total) by mouth daily., Disp: 30 tablet, Rfl: 0 .  tamsulosin (FLOMAX) 0.4 MG CAPS capsule, Take 1 capsule (0.4 mg total) by mouth daily., Disp: 60 capsule, Rfl: 0  Review of Systems:  Constitutional: Denies fever, chills, diaphoresis, appetite change and fatigue.  HEENT: Denies  photophobia, eye pain, redness, hearing loss, ear pain, congestion, sore throat, rhinorrhea, sneezing, mouth sores, trouble swallowing, neck pain, neck stiffness and tinnitus.   Respiratory: Denies SOB, DOE, cough, chest tightness,  and wheezing.   Cardiovascular: Denies chest pain, palpitations and leg swelling.  Gastrointestinal: Denies nausea, vomiting, abdominal pain, diarrhea, constipation, blood in stool and abdominal distention.  Genitourinary: Denies dysuria, urgency, frequency, hematuria, flank pain and difficulty urinating.  Endocrine: Denies: hot or cold intolerance, sweats, changes in hair or nails, polyuria, polydipsia. Musculoskeletal: Denies myalgias, back pain, joint swelling, arthralgias and gait problem.  Skin: Denies pallor, rash and wound.  Neurological: Denies dizziness, seizures, syncope, weakness, light-headedness, numbness and headaches.  Hematological: Denies adenopathy. Easy bruising, personal or family bleeding history  Psychiatric/Behavioral: Denies suicidal ideation, mood changes, confusion, nervousness,  and agitation, positive sleep disturbance.    Physical Exam: Vitals:   04/15/18 1502  BP: 120/70  Pulse: 88  Temp: 98.3 F (36.8 C)  TempSrc: Oral  SpO2: 98%  Weight: 214 lb 1.6 oz (97.1 kg)   Body mass index is 30.72 kg/m.  Constitutional: NAD, calm, comfortable Eyes: PERRL, lids and conjunctivae normal ENMT: Mucous membranes are moist. Posterior pharynx clear of any exudate or lesions. Normal dentition.  Respiratory: clear to auscultation bilaterally, no wheezing, no crackles. Normal respiratory effort. No accessory muscle use.  Cardiovascular: Regular rate and rhythm, no murmurs / rubs / gallops. No extremity edema. 2+ pedal pulses. No carotid bruits.  Abdomen: no tenderness, no masses palpated. No hepatosplenomegaly. Bowel sounds positive.  Musculoskeletal: no clubbing / cyanosis. No joint deformity upper and lower extremities. Good ROM, no  contractures. Normal muscle tone.  Skin: no rashes, lesions, ulcers. No induration Neurologic: CN 2-12 grossly intact. Sensation intact, DTR normal. Strength 5/5 in all 4.  Psychiatric: Normal judgment and insight. Alert and oriented x 3. Normal mood.    Impression and Plan:  Hyperglycemia/impaired glucose tolerance -No diagnosis of diabetes that I can find.  Lab Results  Component Value Date   HGBA1C 5.7 (A) 04/15/2018   -A1c today is 5.7 which at most puts him in the impaired glucose tolerance range. -We will discontinue Actos today. -Plan to follow A1c, if medications are needed logical choice would be metformin.  Insomnia, unspecified type  -Spent the majority of our visit discussing this issue, adequate sleep hygiene techniques, we have discussed that it is not appropriate for him to take Lunesta past 10 PM as it will cause sleepiness during the morning hours. -He would  like to try increasing the dose of Lunesta from 2 to 3 mg at bedtime as needed. -I have also highly suggested that he see a behavioral health counselor to investigate how his anxiety and depression might be playing into his insomnia. -He has been asked to refrain from caffeine use and heavy alcohol use, he is not a smoker.  HYPERCHOLESTEROLEMIA -LDL was 78 in July 2019, is on Lipitor 10 mg daily, will continue.     Patient Instructions  -It was nice meeting you today.  -We will increase your Lunesta to 3 mg at bedtime as needed for sleep.  -Please remember the sleep hygiene techniques that we discussed during your visit.  -I highly recommend scheduling an appointment with our counselor Dennison Bulla to discuss your challenges with insomnia and how your depression and anxiety may be playing into it.  -Please schedule follow-up with me in July 2020 for your annual physical.  Please come fasting to that visit.  Insomnia Insomnia is a sleep disorder that makes it difficult to fall asleep or to stay asleep.  Insomnia can cause tiredness (fatigue), low energy, difficulty concentrating, mood swings, and poor performance at work or school. There are three different ways to classify insomnia:  Difficulty falling asleep.  Difficulty staying asleep.  Waking up too early in the morning.  Any type of insomnia can be long-term (chronic) or short-term (acute). Both are common. Short-term insomnia usually lasts for three months or less. Chronic insomnia occurs at least three times a week for longer than three months. What are the causes? Insomnia may be caused by another condition, situation, or substance, such as:  Anxiety.  Certain medicines.  Gastroesophageal reflux disease (GERD) or other gastrointestinal conditions.  Asthma or other breathing conditions.  Restless legs syndrome, sleep apnea, or other sleep disorders.  Chronic pain.  Menopause. This may include hot flashes.  Stroke.  Abuse of alcohol, tobacco, or illegal drugs.  Depression.  Caffeine.  Neurological disorders, such as Alzheimer disease.  An overactive thyroid (hyperthyroidism).  The cause of insomnia may not be known. What increases the risk? Risk factors for insomnia include:  Gender. Women are more commonly affected than men.  Age. Insomnia is more common as you get older.  Stress. This may involve your professional or personal life.  Income. Insomnia is more common in people with lower income.  Lack of exercise.  Irregular work schedule or night shifts.  Traveling between different time zones.  What are the signs or symptoms? If you have insomnia, trouble falling asleep or trouble staying asleep is the main symptom. This may lead to other symptoms, such as:  Feeling fatigued.  Feeling nervous about going to sleep.  Not feeling rested in the morning.  Having trouble concentrating.  Feeling irritable, anxious, or depressed.  How is this treated? Treatment for insomnia depends on the cause.  If your insomnia is caused by an underlying condition, treatment will focus on addressing the condition. Treatment may also include:  Medicines to help you sleep.  Counseling or therapy.  Lifestyle adjustments.  Follow these instructions at home:  Take medicines only as directed by your health care provider.  Keep regular sleeping and waking hours. Avoid naps.  Keep a sleep diary to help you and your health care provider figure out what could be causing your insomnia. Include: ? When you sleep. ? When you wake up during the night. ? How well you sleep. ? How rested you feel the next day. ? Any  side effects of medicines you are taking. ? What you eat and drink.  Make your bedroom a comfortable place where it is easy to fall asleep: ? Put up shades or special blackout curtains to block light from outside. ? Use a white noise machine to block noise. ? Keep the temperature cool.  Exercise regularly as directed by your health care provider. Avoid exercising right before bedtime.  Use relaxation techniques to manage stress. Ask your health care provider to suggest some techniques that may work well for you. These may include: ? Breathing exercises. ? Routines to release muscle tension. ? Visualizing peaceful scenes.  Cut back on alcohol, caffeinated beverages, and cigarettes, especially close to bedtime. These can disrupt your sleep.  Do not overeat or eat spicy foods right before bedtime. This can lead to digestive discomfort that can make it hard for you to sleep.  Limit screen use before bedtime. This includes: ? Watching TV. ? Using your smartphone, tablet, and computer.  Stick to a routine. This can help you fall asleep faster. Try to do a quiet activity, brush your teeth, and go to bed at the same time each night.  Get out of bed if you are still awake after 15 minutes of trying to sleep. Keep the lights down, but try reading or doing a quiet activity. When you feel  sleepy, go back to bed.  Make sure that you drive carefully. Avoid driving if you feel very sleepy.  Keep all follow-up appointments as directed by your health care provider. This is important. Contact a health care provider if:  You are tired throughout the day or have trouble in your daily routine due to sleepiness.  You continue to have sleep problems or your sleep problems get worse. Get help right away if:  You have serious thoughts about hurting yourself or someone else. This information is not intended to replace advice given to you by your health care provider. Make sure you discuss any questions you have with your health care provider. Document Released: 04/24/2000 Document Revised: 09/27/2015 Document Reviewed: 01/26/2014 Elsevier Interactive Patient Education  2018 [place], MD [place] [facility]

## 2018-04-19 ENCOUNTER — Other Ambulatory Visit: Payer: Self-pay | Admitting: Endocrinology

## 2018-04-19 NOTE — Telephone Encounter (Signed)
Please refer requests to new PCP

## 2018-04-20 ENCOUNTER — Other Ambulatory Visit: Payer: Self-pay | Admitting: Internal Medicine

## 2018-04-20 ENCOUNTER — Other Ambulatory Visit: Payer: Self-pay | Admitting: Endocrinology

## 2018-04-21 ENCOUNTER — Other Ambulatory Visit: Payer: Self-pay | Admitting: Internal Medicine

## 2018-04-21 NOTE — Telephone Encounter (Signed)
Copied from [place] [id number]. Topic: Quick Communication - Rx Refill/Question >> Apr 21, 2018  5:53 PM Blase Mess A wrote: Medication: mirabegron ER (MYRBETRIQ) 50 MG TB24 tablet [[id number]] Patient will be traveling tomorrow  Has the patient contacted their pharmacy? Yes  (Agent: If no, request that the patient contact the pharmacy for the refill.) (Agent: If yes, when and what did the pharmacy advise?)  Preferred Pharmacy (with phone number or street name): [id number] - [place], [address] AT [address] [phone] (Phone) [phone] (Fax)    Agent: Please be advised that RX refills may take up to 3 business days. We ask that you follow-up with your pharmacy.

## 2018-04-22 MED ORDER — MIRABEGRON ER 50 MG PO TB24: 50.0000 mg | ORAL_TABLET | Freq: Every day | ORAL | 1 refills | Status: DC

## 2018-04-22 NOTE — Telephone Encounter (Signed)
[place] 9[place] and needs this refill ASAP please.

## 2018-05-10 ENCOUNTER — Other Ambulatory Visit: Payer: Self-pay | Admitting: Internal Medicine

## 2018-05-10 ENCOUNTER — Other Ambulatory Visit: Payer: Self-pay | Admitting: Endocrinology

## 2018-05-10 DIAGNOSIS — G4700 Insomnia, unspecified: Secondary | ICD-10-CM

## 2018-05-12 MED ORDER — TAMSULOSIN HCL 0.4 MG PO CAPS: 0.4000 mg | ORAL_CAPSULE | Freq: Every day | ORAL | 0 refills | Status: DC

## 2018-05-12 MED ORDER — SERTRALINE HCL 100 MG PO TABS: 100.0000 mg | ORAL_TABLET | Freq: Every day | ORAL | 0 refills | Status: DC

## 2018-05-12 MED ORDER — DUTASTERIDE 0.5 MG PO CAPS: ORAL_CAPSULE | ORAL | 0 refills | Status: DC

## 2018-05-12 NOTE — Telephone Encounter (Signed)
Please advise if refill is appropriate 

## 2018-05-13 MED ORDER — ESZOPICLONE 2 MG PO TABS: 3.0000 mg | ORAL_TABLET | Freq: Every evening | ORAL | 0 refills | Status: DC | PRN

## 2018-05-13 NOTE — Telephone Encounter (Signed)
Please refill x 1 Further refills should be considered by new PCP

## 2018-05-19 ENCOUNTER — Other Ambulatory Visit: Payer: Self-pay | Admitting: Endocrinology

## 2018-05-19 ENCOUNTER — Encounter: Payer: Self-pay | Admitting: Internal Medicine

## 2018-05-19 ENCOUNTER — Other Ambulatory Visit: Payer: Self-pay | Admitting: Internal Medicine

## 2018-05-19 NOTE — Telephone Encounter (Signed)
Please refer request to new dr

## 2018-05-19 NOTE — Telephone Encounter (Signed)
Please advise if refill is appropriate 

## 2018-05-29 ENCOUNTER — Other Ambulatory Visit: Payer: Self-pay | Admitting: Internal Medicine

## 2018-05-29 ENCOUNTER — Other Ambulatory Visit: Payer: Self-pay | Admitting: Endocrinology

## 2018-05-29 NOTE — Telephone Encounter (Signed)
Please forward refill request to pt's new primary care provider.  

## 2018-05-30 ENCOUNTER — Encounter: Payer: Self-pay | Admitting: Internal Medicine

## 2018-05-30 NOTE — Telephone Encounter (Signed)
Please advise if refill is appropriate 

## 2018-05-30 NOTE — Telephone Encounter (Signed)
Please forward refill request to pt's new primary care provider.  

## 2018-05-31 MED ORDER — TAMSULOSIN HCL 0.4 MG PO CAPS: ORAL_CAPSULE | ORAL | 1 refills | Status: DC

## 2018-06-10 ENCOUNTER — Telehealth: Payer: Self-pay | Admitting: Internal Medicine

## 2018-06-10 NOTE — Telephone Encounter (Signed)
What does he use alprazolam for? I do not recall discussing this at our last visit and he is already on lunesta for sleep.Marland KitchenMarland Kitchen

## 2018-06-14 ENCOUNTER — Other Ambulatory Visit: Payer: Self-pay | Admitting: Internal Medicine

## 2018-06-14 DIAGNOSIS — G4700 Insomnia, unspecified: Secondary | ICD-10-CM

## 2018-06-14 MED ORDER — ALPRAZOLAM 0.5 MG PO TABS: 0.5000 mg | ORAL_TABLET | Freq: Three times a day (TID) | ORAL | 0 refills | Status: DC | PRN

## 2018-06-14 NOTE — Telephone Encounter (Signed)
Will refill x 1 today. If he feels he needs to take this medication long-term, please have him schedule a follow up to discuss.

## 2018-06-14 NOTE — Telephone Encounter (Signed)
Pt takes alprazolam for anxiety. Dr Loanne Drilling gave his that medication about 4 to 5 years ago

## 2018-06-20 ENCOUNTER — Ambulatory Visit: Payer: BC Managed Care – PPO | Admitting: Family Medicine

## 2018-06-20 ENCOUNTER — Encounter: Payer: Self-pay | Admitting: Family Medicine

## 2018-06-20 VITALS — BP 120/64 | HR 72 | Temp 98.1°F | Wt 208.2 lb

## 2018-06-20 DIAGNOSIS — R52 Pain, unspecified: Secondary | ICD-10-CM

## 2018-06-20 DIAGNOSIS — J209 Acute bronchitis, unspecified: Secondary | ICD-10-CM | POA: Diagnosis not present

## 2018-06-20 LAB — POCT INFLUENZA A/B
INFLUENZA A, POC: NEGATIVE
Influenza B, POC: NEGATIVE

## 2018-06-20 MED ORDER — AZITHROMYCIN 250 MG PO TABS: ORAL_TABLET | ORAL | 0 refills | Status: DC

## 2018-06-20 NOTE — Progress Notes (Signed)
   Subjective:    Patient ID: Luis Peterson, male    DOB: 10-Oct-1955, 63 y.o.   MRN: [id number]  HPI Here for 4 days of fever, body aches, and coughing up yellow sputum. Taking Mucinex.    Review of Systems  Constitutional: Positive for fever.  HENT: Positive for congestion. Negative for postnasal drip, sinus pressure, sinus pain and sore throat.   Eyes: Negative.   Respiratory: Positive for cough and chest tightness.   Gastrointestinal: Negative.        Objective:   Physical Exam Constitutional:      Appearance: Normal appearance.  HENT:     Right Ear: Tympanic membrane and ear canal normal.     Left Ear: Tympanic membrane and ear canal normal.     Nose: Nose normal.     Mouth/Throat:     Pharynx: Oropharynx is clear.  Eyes:     Conjunctiva/sclera: Conjunctivae normal.  Pulmonary:     Effort: Pulmonary effort is normal. No respiratory distress.     Breath sounds: No stridor. Rhonchi present. No wheezing or rales.  Lymphadenopathy:     Cervical: No cervical adenopathy.  Neurological:     Mental Status: He is alert.           Assessment & Plan:  Bronchitis, treat with a Zpack and Delsym. Alysia Penna, MD

## 2018-07-21 ENCOUNTER — Other Ambulatory Visit: Payer: Self-pay | Admitting: Internal Medicine

## 2018-07-21 DIAGNOSIS — G4700 Insomnia, unspecified: Secondary | ICD-10-CM

## 2018-07-22 MED ORDER — ALPRAZOLAM 0.5 MG PO TABS: 0.5000 mg | ORAL_TABLET | Freq: Three times a day (TID) | ORAL | 0 refills | Status: DC | PRN

## 2018-07-22 MED ORDER — ESZOPICLONE 2 MG PO TABS: 3.0000 mg | ORAL_TABLET | Freq: Every evening | ORAL | 0 refills | Status: DC | PRN

## 2018-08-20 ENCOUNTER — Other Ambulatory Visit: Payer: Self-pay | Admitting: Endocrinology

## 2018-08-21 NOTE — Telephone Encounter (Signed)
Please forward refill request to pt's new primary care provider.  

## 2018-08-22 NOTE — Telephone Encounter (Signed)
Please forward refill request to pt's new primary care provider.  

## 2018-08-30 ENCOUNTER — Other Ambulatory Visit: Payer: Self-pay | Admitting: Internal Medicine

## 2018-08-30 DIAGNOSIS — G4700 Insomnia, unspecified: Secondary | ICD-10-CM

## 2018-08-31 ENCOUNTER — Ambulatory Visit (INDEPENDENT_AMBULATORY_CARE_PROVIDER_SITE_OTHER): Payer: BC Managed Care – PPO | Admitting: Internal Medicine

## 2018-08-31 ENCOUNTER — Encounter: Payer: Self-pay | Admitting: Internal Medicine

## 2018-08-31 ENCOUNTER — Other Ambulatory Visit: Payer: Self-pay

## 2018-08-31 DIAGNOSIS — F411 Generalized anxiety disorder: Secondary | ICD-10-CM

## 2018-08-31 DIAGNOSIS — R7302 Impaired glucose tolerance (oral): Secondary | ICD-10-CM

## 2018-08-31 DIAGNOSIS — G4700 Insomnia, unspecified: Secondary | ICD-10-CM | POA: Diagnosis not present

## 2018-08-31 DIAGNOSIS — N401 Enlarged prostate with lower urinary tract symptoms: Secondary | ICD-10-CM

## 2018-08-31 MED ORDER — ESZOPICLONE 2 MG PO TABS: 3.0000 mg | ORAL_TABLET | Freq: Every evening | ORAL | 1 refills | Status: DC | PRN

## 2018-08-31 MED ORDER — ALPRAZOLAM 0.5 MG PO TABS: 0.5000 mg | ORAL_TABLET | Freq: Three times a day (TID) | ORAL | 1 refills | Status: DC | PRN

## 2018-08-31 NOTE — Telephone Encounter (Signed)
Doxy.me appointment scheduled for 08/31/2018

## 2018-08-31 NOTE — Progress Notes (Signed)
Virtual Visit via Video Note  I connected with Luis Peterson on 08/31/18 at  1:00 PM EDT by a video enabled telemedicine application and verified that I am speaking with the correct person using two identifiers.  Location patient: home Location provider: work office Persons participating in the virtual visit: patient, provider  I discussed the limitations of evaluation and management by telemedicine and the availability of in person appointments. The patient expressed understanding and agreed to proceed.   HPI: This visit is to follow up on chronic medical conditions and for medication refills. His PMH is significant for: Depression, anxiety, insomnia, hyperlipidemia, BPH.  His mood issues have been stable on Zoloft for years.  He has not seen a psychiatrist/counselor.   His insomnia has improved significantly since we last spoke in December after Lunesta dose increase to 3 mg. He stopped taking the actos as we discussed at last visit due to an A1c of 5.7. He is due for repeat A1c to follow; he does not check CBGs routinely.  He feels like his anxiety has increased with the COVID-19 pandemic. Is back on Xanax that he had been prescribed years ago for anxiety, would like a refill today. He is otherwise doing well and has no complaints today.   ROS: Constitutional: Denies fever, chills, diaphoresis, appetite change and fatigue.  HEENT: Denies photophobia, eye pain, redness, hearing loss, ear pain, congestion, sore throat, rhinorrhea, sneezing, mouth sores, trouble swallowing, neck pain, neck stiffness and tinnitus.   Respiratory: Denies SOB, DOE, cough, chest tightness,  and wheezing.   Cardiovascular: Denies chest pain, palpitations and leg swelling.  Gastrointestinal: Denies nausea, vomiting, abdominal pain, diarrhea, constipation, blood in stool and abdominal distention.  Genitourinary: Denies dysuria, urgency, frequency, hematuria, flank pain and difficulty urinating.  Endocrine:  Denies: hot or cold intolerance, sweats, changes in hair or nails, polyuria, polydipsia. Musculoskeletal: Denies myalgias, back pain, joint swelling, arthralgias and gait problem.  Skin: Denies pallor, rash and wound.  Neurological: Denies dizziness, seizures, syncope, weakness, light-headedness, numbness and headaches.  Hematological: Denies adenopathy. Easy bruising, personal or family bleeding history  Psychiatric/Behavioral: Denies suicidal ideation, mood changes, confusion, nervousness, sleep disturbance and agitation   Past Medical History:  Diagnosis Date  . ANXIETY 01/03/2007   Qualifier: Diagnosis of  By: Loanne Drilling MD, Jacelyn Pi   . 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 Consuella Lose  . Degenerative joint disease of cervical spine   . Depression   . DYSPNEA 09/01/2007   Qualifier: Diagnosis of  By: Loanne Drilling MD, Jacelyn Pi   . GERD 06/19/2008   Qualifier: Diagnosis of  By: Loanne Drilling MD, Jacelyn Pi   . HEARING LOSS 06/19/2008   Qualifier: Diagnosis of  By: Loanne Drilling MD, Jacelyn Pi   . HYPERCHOLESTEROLEMIA 09/01/2007   Qualifier: Diagnosis of  By: Loanne Drilling MD, Jacelyn Pi INSOMNIA 06/19/2008   Qualifier: Diagnosis of  By: Loanne Drilling MD, Jone Baseman, SPINE 06/19/2008   Qualifier: Diagnosis of  By: Loanne Drilling MD, Jacelyn Pi Positive H. pylori test 07/1997  . 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 COLONOSCOPY    . ELECTROCARDIOGRAM  05/28/2006  . Stress Cardiolite  08/22/2003  . TYMPANOPLASTY  03/1996   Right    Family History  Problem Relation Age of Onset  . Gastric cancer Father  died age 31  . Stroke Father   . Heart disease Mother   . Colon cancer Sister 34  . Other Brother        prostate issues, x 2 brothers    SOCIAL HX:   reports that he has quit smoking. His smoking use included cigarettes. He quit after 5.00 years of use. He has never used smokeless tobacco. He  reports current alcohol use. He reports that he does not use drugs.   Current Outpatient Medications:  .  alfuzosin (UROXATRAL) 10 MG 24 hr tablet, TK 1 T PO QD, Disp: , Rfl: 12 .  ALPRAZolam (XANAX) 0.5 MG tablet, Take 1 tablet (0.5 mg total) by mouth 3 (three) times daily as needed., Disp: 90 tablet, Rfl: 1 .  atorvastatin (LIPITOR) 10 MG tablet, 1 tablet daily, Disp: 90 tablet, Rfl: 1 .  azithromycin (ZITHROMAX Z-PAK) 250 MG tablet, As directed, Disp: 6 each, Rfl: 0 .  dutasteride (AVODART) 0.5 MG capsule, TAKE 1 CAPSULE(0.5 MG) BY MOUTH DAILY, Disp: 90 capsule, Rfl: 1 .  dutasteride (AVODART) 0.5 MG capsule, TAKE 1 CAPSULE BY MOUTH EVERY DAY, Disp: 30 capsule, Rfl: 0 .  eszopiclone (LUNESTA) 2 MG TABS tablet, Take 1.5 tablets (3 mg total) by mouth at bedtime as needed for sleep. Take immediately before bedtime, Disp: 45 tablet, Rfl: 1 .  omeprazole (PRILOSEC) 20 MG capsule, Take 1 capsule (20 mg total) by mouth daily., Disp: 90 capsule, Rfl: 1 .  sertraline (ZOLOFT) 100 MG tablet, TAKE 1 TABLET(100 MG) BY MOUTH DAILY, Disp: 90 tablet, Rfl: 1 .  sertraline (ZOLOFT) 100 MG tablet, Take 1 tablet (100 mg total) by mouth daily., Disp: 30 tablet, Rfl: 0  EXAM:   VITALS per patient if applicable: none reported  GENERAL: alert, oriented, appears well and in no acute distress  HEENT: atraumatic, conjunttiva clear, no obvious abnormalities on inspection of external nose and ears  NECK: normal movements of the head and neck  LUNGS: on inspection no signs of respiratory distress, breathing rate appears normal, no obvious gross increased work of breathing, gasping or wheezing  CV: no obvious cyanosis  MS: moves all visible extremities without noticeable abnormality  PSYCH/NEURO: pleasant and cooperative, no obvious depression or anxiety, speech and thought processing grossly intact  ASSESSMENT AND PLAN:   Anxiety state -PDMP reviewed, no red flags, OD risk score of 40. -Will refill Xanax  for 2 months today. -We have discussed coping skills and ways to channel anxiety into constructive outlets.  Insomnia, unspecified type  -Refill Lunesta today. Improved since increase in dose in December. -Review of PDMP shows that patient's prior PCP, Dr. Loanne Drilling, has sent in a refill today for lunesta as well. Will contact his office to make them aware that we are now prescribing these meds.  IGT (impaired glucose tolerance) -Off actos; due for A1c at next in-person office visit.  Benign prostatic hyperplasia with lower urinary tract symptoms, symptom details unspecified -Saw GU recently, asked to be taken off meds. -PSA is good.     I discussed the assessment and treatment plan with the patient. The patient was provided an opportunity to ask questions and all were answered. The patient agreed with the plan and demonstrated an understanding of the instructions.   The patient was advised to call back or seek an in-person evaluation if the symptoms worsen or if the condition fails to improve as anticipated.    Lelon Frohlich, MD  [facility]

## 2018-08-31 NOTE — Telephone Encounter (Signed)
Left message on machine for patient to return our call to schedule a Doxy.me appointment

## 2018-10-04 ENCOUNTER — Other Ambulatory Visit: Payer: Self-pay | Admitting: Endocrinology

## 2018-10-04 DIAGNOSIS — G4700 Insomnia, unspecified: Secondary | ICD-10-CM

## 2018-10-19 ENCOUNTER — Encounter: Payer: Self-pay | Admitting: Internal Medicine

## 2018-10-19 ENCOUNTER — Other Ambulatory Visit: Payer: Self-pay

## 2018-10-19 ENCOUNTER — Ambulatory Visit (INDEPENDENT_AMBULATORY_CARE_PROVIDER_SITE_OTHER): Payer: BC Managed Care – PPO | Admitting: Internal Medicine

## 2018-10-19 VITALS — BP 120/80 | HR 82 | Temp 98.2°F | Ht 69.5 in | Wt 206.0 lb

## 2018-10-19 DIAGNOSIS — E663 Overweight: Secondary | ICD-10-CM

## 2018-10-19 DIAGNOSIS — Z23 Encounter for immunization: Secondary | ICD-10-CM | POA: Diagnosis not present

## 2018-10-19 DIAGNOSIS — Z0000 Encounter for general adult medical examination without abnormal findings: Secondary | ICD-10-CM

## 2018-10-19 DIAGNOSIS — E7800 Pure hypercholesterolemia, unspecified: Secondary | ICD-10-CM | POA: Diagnosis not present

## 2018-10-19 DIAGNOSIS — R351 Nocturia: Secondary | ICD-10-CM

## 2018-10-19 DIAGNOSIS — F411 Generalized anxiety disorder: Secondary | ICD-10-CM

## 2018-10-19 DIAGNOSIS — G4700 Insomnia, unspecified: Secondary | ICD-10-CM | POA: Diagnosis not present

## 2018-10-19 DIAGNOSIS — N401 Enlarged prostate with lower urinary tract symptoms: Secondary | ICD-10-CM

## 2018-10-19 DIAGNOSIS — F339 Major depressive disorder, recurrent, unspecified: Secondary | ICD-10-CM

## 2018-10-19 LAB — COMPREHENSIVE METABOLIC PANEL
ALT: 32 U/L (ref 0–53)
AST: 24 U/L (ref 0–37)
Albumin: 4.6 g/dL (ref 3.5–5.2)
Alkaline Phosphatase: 56 U/L (ref 39–117)
BUN: 11 mg/dL (ref 6–23)
CO2: 25 mEq/L (ref 19–32)
Calcium: 9.6 mg/dL (ref 8.4–10.5)
Chloride: 102 mEq/L (ref 96–112)
Creatinine, Ser: 0.71 mg/dL (ref 0.40–1.50)
GFR: 112.49 mL/min (ref >60.00)
Glucose, Bld: 110 mg/dL — ABNORMAL HIGH (ref 70–99)
Potassium: 4 mEq/L (ref 3.5–5.1)
Sodium: 137 mEq/L (ref 135–145)
Total Bilirubin: 0.6 mg/dL (ref 0.2–1.2)
Total Protein: 7.2 g/dL (ref 6.0–8.3)

## 2018-10-19 LAB — CBC WITH DIFFERENTIAL/PLATELET
Basophils Absolute: 0 10*3/uL (ref 0.0–0.1)
Basophils Relative: 0.3 % (ref 0.0–3.0)
Eosinophils Absolute: 0.1 10*3/uL (ref 0.0–0.7)
Eosinophils Relative: 1.7 % (ref 0.0–5.0)
HCT: 44 % (ref 39.0–52.0)
Hemoglobin: 14.8 g/dL (ref 13.0–17.0)
Lymphocytes Relative: 32.7 % (ref 12.0–46.0)
Lymphs Abs: 2.6 10*3/uL (ref 0.7–4.0)
MCHC: 33.7 g/dL (ref 30.0–36.0)
MCV: 87.4 fl (ref 78.0–100.0)
Monocytes Absolute: 0.6 10*3/uL (ref 0.1–1.0)
Monocytes Relative: 8 % (ref 3.0–12.0)
Neutro Abs: 4.6 10*3/uL (ref 1.4–7.7)
Neutrophils Relative %: 57.3 % (ref 43.0–77.0)
Platelets: 202 10*3/uL (ref 150.0–400.0)
RBC: 5.04 Mil/uL (ref 4.22–5.81)
RDW: 12.5 % (ref 11.5–15.5)
WBC: 8 10*3/uL (ref 4.0–10.5)

## 2018-10-19 LAB — LIPID PANEL
Cholesterol: 171 mg/dL (ref 0–200)
HDL: 44.6 mg/dL (ref >39.00)
NonHDL: 126.21
Total CHOL/HDL Ratio: 4
Triglycerides: 293 mg/dL — ABNORMAL HIGH (ref 0.0–149.0)
VLDL: 58.6 mg/dL — ABNORMAL HIGH (ref 0.0–40.0)

## 2018-10-19 LAB — VITAMIN D 25 HYDROXY (VIT D DEFICIENCY, FRACTURES): VITD: 32.08 ng/mL (ref 30.00–100.00)

## 2018-10-19 LAB — TSH: TSH: 0.94 u[IU]/mL (ref 0.35–4.50)

## 2018-10-19 LAB — HEMOGLOBIN A1C: Hgb A1c MFr Bld: 6.6 % — ABNORMAL HIGH (ref 4.6–6.5)

## 2018-10-19 LAB — LDL CHOLESTEROL, DIRECT: Direct LDL: 81 mg/dL

## 2018-10-19 LAB — VITAMIN B12: Vitamin B-12: 511 pg/mL (ref 211–911)

## 2018-10-19 NOTE — Progress Notes (Signed)
Established Patient Office Visit     CC/Reason for Visit: Annual CPE  HPI: Luis Peterson is a 63 y.o. male who is coming in today for the above mentioned reasons. Past Medical History is significant for: Depression, anxiety, insomnia, hyperlipidemia. his mood has been stable on Zoloft for years.  Regards to his insomnia he is actually trying to wean off of the [facility] which he is now taking every other day.  He has no acute complaints today.  He has routine dental care, has not seen an eye doctor in a few years.  We have discussed shingles vaccination which he agrees to receive today.  He had a colonoscopy in 2018 and will be due again in 2023.   Past Medical/Surgical History: Past Medical History:  Diagnosis Date   ANXIETY 01/03/2007   Qualifier: Diagnosis of  By: Loanne Drilling MD, Sean A    BEN LOC HYPERPLASIA PROS W/UR OBST & OTH LUTS 06/19/2008   Dr. Consuella Lose   Degenerative joint disease of cervical spine    Depression    DYSPNEA 09/01/2007   Qualifier: Diagnosis of  By: Loanne Drilling MD, Sean A    GERD 06/19/2008   Qualifier: Diagnosis of  By: Loanne Drilling MD, Sean A    HEARING LOSS 06/19/2008   Qualifier: Diagnosis of  By: Loanne Drilling MD, Jacelyn Pi    HYPERCHOLESTEROLEMIA 09/01/2007   Qualifier: Diagnosis of  By: Loanne Drilling MD, Jacelyn Pi    INSOMNIA 06/19/2008   Qualifier: Diagnosis of  By: Loanne Drilling MD, Jone Baseman, 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 His smoking use included cigarettes. He quit after 5.00 years of use. He has never used smokeless tobacco. He reports current alcohol use. He reports that he does not use  drugs.  Allergies: Allergies  Allergen Reactions   Simvastatin     REACTION: Rash    Family History:  Family History  Problem Relation Age of Onset   Gastric cancer Father        died age 38   Stroke Father    Heart disease Mother    Colon cancer Sister 87   Other Brother        prostate issues, x 2 brothers     Current Outpatient Medications:    ALPRAZolam (XANAX) 0.5 MG tablet, Take 1 tablet (0.5 mg total) by mouth 3 (three) times daily as needed., Disp: 90 tablet, Rfl: 1   atorvastatin (LIPITOR) 10 MG tablet, 1 tablet daily, Disp: 90 tablet, Rfl: 1   dutasteride (AVODART) 0.5 MG capsule, TAKE 1 CAPSULE BY MOUTH EVERY DAY, Disp: 30 capsule, Rfl: 0   eszopiclone (LUNESTA) 2 MG TABS tablet, Take 1.5 tablets (3 mg total) by mouth at bedtime as needed for sleep. Take immediately before bedtime (Patient taking differently: Take 3 mg by mouth at bedtime as needed for sleep. Take immediately before bedtime), Disp: 45 tablet, Rfl: 1   omeprazole (PRILOSEC) 20 MG capsule, Take 1 capsule (20 mg total) by mouth daily., Disp: 90 capsule, Rfl: 1  sertraline (ZOLOFT) 100 MG tablet, Take 1 tablet (100 mg total) by mouth daily., Disp: 30 tablet, Rfl: 0  Review of Systems:  Constitutional: Denies fever, chills, diaphoresis, appetite change and fatigue.  HEENT: Denies photophobia, eye pain, redness, hearing loss, ear pain, congestion, sore throat, rhinorrhea, sneezing, mouth sores, trouble swallowing, neck pain, neck stiffness and tinnitus.   Respiratory: Denies SOB, DOE, cough, chest tightness,  and wheezing.   Cardiovascular: Denies chest pain, palpitations and leg swelling.  Gastrointestinal: Denies nausea, vomiting, abdominal pain, diarrhea, constipation, blood in stool and abdominal distention.  Genitourinary: Denies dysuria, urgency, frequency, hematuria, flank pain and difficulty urinating.  Endocrine: Denies: hot or cold intolerance, sweats, changes in hair or nails,  polyuria, polydipsia. Musculoskeletal: Denies myalgias, back pain, joint swelling, arthralgias and gait problem.  Skin: Denies pallor, rash and wound.  Neurological: Denies dizziness, seizures, syncope, weakness, light-headedness, numbness and headaches.  Hematological: Denies adenopathy. Easy bruising, personal or family bleeding history  Psychiatric/Behavioral: Denies suicidal ideation, mood changes, confusion, nervousness, sleep disturbance and agitation    Physical Exam: Vitals:   10/19/18 0939  BP: 120/80  Pulse: 82  Temp: 98.2 F (36.8 C)  TempSrc: Oral  SpO2: 94%  Weight: 206 lb (93.4 kg)  Height: 5' 9.5" (1.765 m)    Body mass index is 29.98 kg/m.   Constitutional: NAD, calm, comfortable Eyes: PERRL, lids and conjunctivae normal ENMT: Mucous membranes are moist. Posterior pharynx clear of any exudate or lesions. Normal dentition. Tympanic membrane is pearly white, no erythema or bulging. Neck: normal, supple, no masses, no thyromegaly Respiratory: clear to auscultation bilaterally, no wheezing, no crackles. Normal respiratory effort. No accessory muscle use.  Cardiovascular: Regular rate and rhythm, no murmurs / rubs / gallops. No extremity edema. 2+ pedal pulses. No carotid bruits.  Abdomen: no tenderness, no masses palpated. No hepatosplenomegaly. Bowel sounds positive.  Musculoskeletal: no clubbing / cyanosis. No joint deformity upper and lower extremities. Good ROM, no contractures. Normal muscle tone.  Skin: no rashes, lesions, ulcers. No induration Neurologic: CN 2-12 grossly intact. Sensation intact, DTR normal. Strength 5/5 in all 4.  Psychiatric: Normal judgment and insight. Alert and oriented x 3. Normal mood.    Impression and Plan:  Encounter for preventive health examination  -Screening labs will be performed today. -He is advised to have routine eye and dental care. -He will receive his first shingles vaccination today, otherwise his immunizations are  up-to-date. -He had his colonoscopy in 2018 and will be due again in 2023. -We have discussed healthy lifestyle changes.  HYPERCHOLESTEROLEMIA  -On atorvastatin, last LDL was 70 in July 2019, recheck today.  Insomnia, unspecified type -He will continue weaning Lunesta as he is able.  Overweight (BMI 25.0-29.9) -Discussed healthy lifestyle, including increased physical activity and better food choices to promote weight loss.   Benign prostatic hyperplasia with nocturia -He is on Avodart  Depression, recurrent (HCC) Anxiety state -Mood is stable on Zoloft  Need for shingles vaccine - Plan: Varicella-zoster vaccine IM (Shingrix)    Patient Instructions  -Nice seeing you today!!  -Lab work today, will notify you when results are available.  -1/2 shingles vaccinations today. Next one in 2-6 months.  -Make sure you schedule a visit with your eye doctor.  -Schedule follow up in 6 months.   Preventive Care 40-64 Years, Male Preventive care refers to lifestyle choices and visits with your health care provider that can promote health and wellness. What does preventive care include?   A yearly physical exam.  This is also called an annual well check.  Dental exams once or twice a year.  Routine eye exams. Ask your health care provider how often you should have your eyes checked.  Personal lifestyle choices, including: ? Daily care of your teeth and gums. ? Regular physical activity. ? Eating a healthy diet. ? Avoiding tobacco and drug use. ? Limiting alcohol use. ? Practicing safe sex. ? Taking low-dose aspirin every day starting at age 35. What happens during an annual well check? The services and screenings done by your health care provider during your annual well check will depend on your age, overall health, lifestyle risk factors, and family history of disease. Counseling Your health care provider may ask you questions about your:  Alcohol use.  Tobacco  use.  Drug use.  Emotional well-being.  Home and relationship well-being.  Sexual activity.  Eating habits.  Work and work Statistician. Screening You may have the following tests or measurements:  Height, weight, and BMI.  Blood pressure.  Lipid and cholesterol levels. These may be checked every 5 years, or more frequently if you are over 44 years old.  Skin check.  Lung cancer screening. You may have this screening every year starting at age 44 if you have a 30-pack-year history of smoking and currently smoke or have quit within the past 15 years.  Colorectal cancer screening. All adults should have this screening starting at age 65 and continuing until age 2. Your health care provider may recommend screening at age 60. You will have tests every 1-10 years, depending on your results and the type of screening test. People at increased risk should start screening at an earlier age. Screening tests may include: ? Guaiac-based fecal occult blood testing. ? Fecal immunochemical test (FIT). ? Stool DNA test. ? Virtual colonoscopy. ? Sigmoidoscopy. During this test, a flexible tube with a tiny camera (sigmoidoscope) is used to examine your rectum and lower colon. The sigmoidoscope is inserted through your anus into your rectum and lower colon. ? Colonoscopy. During this test, a long, thin, flexible tube with a tiny camera (colonoscope) is used to examine your entire colon and rectum.  Prostate cancer screening. Recommendations will vary depending on your family history and other risks.  Hepatitis C blood test.  Hepatitis B blood test.  Sexually transmitted disease (STD) testing.  Diabetes screening. This is done by checking your blood sugar (glucose) after you have not eaten for a while (fasting). You may have this done every 1-3 years. Discuss your test results, treatment options, and if necessary, the need for more tests with your health care provider. Vaccines Your health  care provider may recommend certain vaccines, such as:  Influenza vaccine. This is recommended every year.  Tetanus, diphtheria, and acellular pertussis (Tdap, Td) vaccine. You may need a Td booster every 10 years.  Varicella vaccine. You may need this if you have not been vaccinated.  Zoster vaccine. You may need this after age 45.  Measles, mumps, and rubella (MMR) vaccine. You may need at least one dose of MMR if you were born in 1957 or later. You may also need a second dose.  Pneumococcal 13-valent conjugate (PCV13) vaccine. You may need this if you have certain conditions and have not been vaccinated.  Pneumococcal polysaccharide (PPSV23) vaccine. You may need one or two doses if you smoke cigarettes or if you have certain conditions.  Meningococcal vaccine. You may need this if you have certain conditions.  Hepatitis A vaccine. You  may need this if you have certain conditions or if you travel or work in places where you may be exposed to hepatitis A.  Hepatitis B vaccine. You may need this if you have certain conditions or if you travel or work in places where you may be exposed to hepatitis B.  Haemophilus influenzae type b (Hib) vaccine. You may need this if you have certain risk factors. Talk to your health care provider about which screenings and vaccines you need and how often you need them. This information is not intended to replace advice given to you by your health care provider. Make sure you discuss any questions you have with your health care provider. Document Released: 05/24/2015 Document Revised: 06/17/2017 Document Reviewed: 02/26/2015 Elsevier Interactive Patient Education  2019 [place], [place] [facility] at [facility]

## 2018-10-19 NOTE — Patient Instructions (Addendum)
-Nice seeing you today!!  -Lab work today, will notify you when results are available.  -1/2 shingles vaccinations today. Next one in 2-6 months.  -Make sure you schedule a visit with your eye doctor.  -Schedule follow up in 6 months.   Preventive Care 40-64 Years, Male Preventive care refers to lifestyle choices and visits with your health care provider that can promote health and wellness. What does preventive care include?   A yearly physical exam. This is also called an annual well check.  Dental exams once or twice a year.  Routine eye exams. Ask your health care provider how often you should have your eyes checked.  Personal lifestyle choices, including: ? Daily care of your teeth and gums. ? Regular physical activity. ? Eating a healthy diet. ? Avoiding tobacco and drug use. ? Limiting alcohol use. ? Practicing safe sex. ? Taking low-dose aspirin every day starting at age 65. What happens during an annual well check? The services and screenings done by your health care provider during your annual well check will depend on your age, overall health, lifestyle risk factors, and family history of disease. Counseling Your health care provider may ask you questions about your:  Alcohol use.  Tobacco use.  Drug use.  Emotional well-being.  Home and relationship well-being.  Sexual activity.  Eating habits.  Work and work Statistician. Screening You may have the following tests or measurements:  Height, weight, and BMI.  Blood pressure.  Lipid and cholesterol levels. These may be checked every 5 years, or more frequently if you are over 69 years old.  Skin check.  Lung cancer screening. You may have this screening every year starting at age 75 if you have a 30-pack-year history of smoking and currently smoke or have quit within the past 15 years.  Colorectal cancer screening. All adults should have this screening starting at age 89 and continuing until age  46. Your health care provider may recommend screening at age 44. You will have tests every 1-10 years, depending on your results and the type of screening test. People at increased risk should start screening at an earlier age. Screening tests may include: ? Guaiac-based fecal occult blood testing. ? Fecal immunochemical test (FIT). ? Stool DNA test. ? Virtual colonoscopy. ? Sigmoidoscopy. During this test, a flexible tube with a tiny camera (sigmoidoscope) is used to examine your rectum and lower colon. The sigmoidoscope is inserted through your anus into your rectum and lower colon. ? Colonoscopy. During this test, a long, thin, flexible tube with a tiny camera (colonoscope) is used to examine your entire colon and rectum.  Prostate cancer screening. Recommendations will vary depending on your family history and other risks.  Hepatitis C blood test.  Hepatitis B blood test.  Sexually transmitted disease (STD) testing.  Diabetes screening. This is done by checking your blood sugar (glucose) after you have not eaten for a while (fasting). You may have this done every 1-3 years. Discuss your test results, treatment options, and if necessary, the need for more tests with your health care provider. Vaccines Your health care provider may recommend certain vaccines, such as:  Influenza vaccine. This is recommended every year.  Tetanus, diphtheria, and acellular pertussis (Tdap, Td) vaccine. You may need a Td booster every 10 years.  Varicella vaccine. You may need this if you have not been vaccinated.  Zoster vaccine. You may need this after age 36.  Measles, mumps, and rubella (MMR) vaccine. You may need at  least one dose of MMR if you were born in 1957 or later. You may also need a second dose.  Pneumococcal 13-valent conjugate (PCV13) vaccine. You may need this if you have certain conditions and have not been vaccinated.  Pneumococcal polysaccharide (PPSV23) vaccine. You may need one  or two doses if you smoke cigarettes or if you have certain conditions.  Meningococcal vaccine. You may need this if you have certain conditions.  Hepatitis A vaccine. You may need this if you have certain conditions or if you travel or work in places where you may be exposed to hepatitis A.  Hepatitis B vaccine. You may need this if you have certain conditions or if you travel or work in places where you may be exposed to hepatitis B.  Haemophilus influenzae type b (Hib) vaccine. You may need this if you have certain risk factors. Talk to your health care provider about which screenings and vaccines you need and how often you need them. This information is not intended to replace advice given to you by your health care provider. Make sure you discuss any questions you have with your health care provider. Document Released: 05/24/2015 Document Revised: 06/17/2017 Document Reviewed: 02/26/2015 Elsevier Interactive Patient Education  2019 [employer].

## 2018-10-20 ENCOUNTER — Other Ambulatory Visit: Payer: Self-pay | Admitting: Internal Medicine

## 2018-10-20 ENCOUNTER — Encounter: Payer: Self-pay | Admitting: Internal Medicine

## 2018-10-20 DIAGNOSIS — E119 Type 2 diabetes mellitus without complications: Secondary | ICD-10-CM

## 2018-10-20 DIAGNOSIS — E7800 Pure hypercholesterolemia, unspecified: Secondary | ICD-10-CM

## 2018-10-20 HISTORY — DX: Type 2 diabetes mellitus without complications: E11.9

## 2018-10-20 MED ORDER — ATORVASTATIN CALCIUM 20 MG PO TABS: 20.0000 mg | ORAL_TABLET | Freq: Every day | ORAL | 1 refills | Status: DC

## 2018-10-20 MED ORDER — METFORMIN HCL 500 MG PO TABS: 500.0000 mg | ORAL_TABLET | Freq: Two times a day (BID) | ORAL | 1 refills | Status: DC

## 2018-11-07 ENCOUNTER — Encounter: Payer: Self-pay | Admitting: Internal Medicine

## 2018-11-27 ENCOUNTER — Other Ambulatory Visit: Payer: Self-pay | Admitting: Internal Medicine

## 2018-11-27 DIAGNOSIS — G4700 Insomnia, unspecified: Secondary | ICD-10-CM

## 2018-11-28 ENCOUNTER — Other Ambulatory Visit: Payer: Self-pay | Admitting: Internal Medicine

## 2018-12-07 ENCOUNTER — Encounter: Payer: Self-pay | Admitting: Internal Medicine

## 2018-12-27 ENCOUNTER — Other Ambulatory Visit: Payer: Self-pay | Admitting: Endocrinology

## 2018-12-27 ENCOUNTER — Other Ambulatory Visit: Payer: Self-pay | Admitting: Internal Medicine

## 2018-12-27 DIAGNOSIS — G4700 Insomnia, unspecified: Secondary | ICD-10-CM

## 2018-12-27 NOTE — Telephone Encounter (Signed)
Please forward refill request to pt's new primary care provider.  

## 2018-12-27 NOTE — Telephone Encounter (Signed)
Per Dr. Cordelia Pen request, I am forwarding you this refill request. Please review and refill if appropriate

## 2018-12-27 NOTE — Telephone Encounter (Signed)
Please advise if you wish to manage and refill

## 2019-01-25 ENCOUNTER — Ambulatory Visit: Payer: BC Managed Care – PPO | Admitting: Internal Medicine

## 2019-01-25 ENCOUNTER — Encounter: Payer: Self-pay | Admitting: Internal Medicine

## 2019-01-25 ENCOUNTER — Other Ambulatory Visit: Payer: Self-pay

## 2019-01-25 VITALS — BP 102/64 | HR 86 | Temp 97.8°F | Wt 188.7 lb

## 2019-01-25 DIAGNOSIS — K7581 Nonalcoholic steatohepatitis (NASH): Secondary | ICD-10-CM

## 2019-01-25 DIAGNOSIS — E119 Type 2 diabetes mellitus without complications: Secondary | ICD-10-CM | POA: Diagnosis not present

## 2019-01-25 DIAGNOSIS — G4700 Insomnia, unspecified: Secondary | ICD-10-CM | POA: Diagnosis not present

## 2019-01-25 DIAGNOSIS — Z23 Encounter for immunization: Secondary | ICD-10-CM

## 2019-01-25 DIAGNOSIS — E7800 Pure hypercholesterolemia, unspecified: Secondary | ICD-10-CM

## 2019-01-25 LAB — LIPID PANEL
Cholesterol: 123 mg/dL (ref 0–200)
HDL: 49.8 mg/dL (ref >39.00)
LDL Cholesterol: 52 mg/dL (ref 0–99)
NonHDL: 73.42
Total CHOL/HDL Ratio: 2
Triglycerides: 106 mg/dL (ref 0.0–149.0)
VLDL: 21.2 mg/dL (ref 0.0–40.0)

## 2019-01-25 LAB — POCT GLYCOSYLATED HEMOGLOBIN (HGB A1C): Hemoglobin A1C: 5.5 % (ref 4.0–5.6)

## 2019-01-25 MED ORDER — METFORMIN HCL 500 MG PO TABS: 500.0000 mg | ORAL_TABLET | Freq: Two times a day (BID) | ORAL | 1 refills | Status: DC

## 2019-01-25 MED ORDER — ATORVASTATIN CALCIUM 20 MG PO TABS: 20.0000 mg | ORAL_TABLET | Freq: Every day | ORAL | 1 refills | Status: DC

## 2019-01-25 MED ORDER — DUTASTERIDE 0.5 MG PO CAPS: 0.5000 mg | ORAL_CAPSULE | Freq: Every day | ORAL | 1 refills | Status: DC

## 2019-01-25 MED ORDER — OMEPRAZOLE 20 MG PO CPDR: 20.0000 mg | DELAYED_RELEASE_CAPSULE | Freq: Every day | ORAL | 1 refills | Status: DC

## 2019-01-25 MED ORDER — ALPRAZOLAM 0.5 MG PO TABS: ORAL_TABLET | ORAL | 0 refills | Status: DC

## 2019-01-25 MED ORDER — SERTRALINE HCL 100 MG PO TABS: ORAL_TABLET | ORAL | 0 refills | Status: DC

## 2019-01-25 MED ORDER — ESZOPICLONE 2 MG PO TABS: 3.0000 mg | ORAL_TABLET | Freq: Every evening | ORAL | 1 refills | Status: DC | PRN

## 2019-01-25 NOTE — Progress Notes (Signed)
Established Patient Office Visit     CC/Reason for Visit: Follow-up newly diagnosed diabetes, hyperlipidemia and second shingles vaccine  HPI: Luis Peterson is a 63 y.o. male who is coming in today for the above mentioned reasons. Past Medical History is significant for: Depression, anxiety, insomnia, hyperlipidemia. his mood has been stable on Zoloft for years.  During his annual physical in June he was diagnosed with diabetes with an A1c of 6.6 and started on metformin 500 mg twice daily.  We also increased his Lipitor from 10 to 20 mg to try and bring his LDL cholesterol below 70.  He is here today for diabetes and hypertension follow-up.  He would also like to receive his second shingles vaccination today.  He has done a great job with lifestyle modification over the summer, has lost around 20 pounds.   Past Medical/Surgical History: Past Medical History:  Diagnosis Date  . ANXIETY 01/03/2007   Qualifier: Diagnosis of  By: Loanne Drilling MD, Jacelyn Pi   . 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 Consuella Lose  . Degenerative joint disease of cervical spine   . Depression   . DYSPNEA 09/01/2007   Qualifier: Diagnosis of  By: Loanne Drilling MD, Jacelyn Pi   . GERD 06/19/2008   Qualifier: Diagnosis of  By: Loanne Drilling MD, Jacelyn Pi   . HEARING LOSS 06/19/2008   Qualifier: Diagnosis of  By: Loanne Drilling MD, Jacelyn Pi   . HYPERCHOLESTEROLEMIA 09/01/2007   Qualifier: Diagnosis of  By: Loanne Drilling MD, Jacelyn Pi INSOMNIA 06/19/2008   Qualifier: Diagnosis of  By: Loanne Drilling MD, Jone Baseman, SPINE 06/19/2008   Qualifier: Diagnosis of  By: Loanne Drilling MD, Jacelyn Pi Positive H. pylori test 07/1997  . 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 COLONOSCOPY    . ELECTROCARDIOGRAM  05/28/2006  . Stress Cardiolite  08/22/2003  . TYMPANOPLASTY  03/1996   Right    Social History:  reports that he has quit smoking. His  smoking use included cigarettes. He quit after 5.00 years of use. He has never used smokeless tobacco. He reports current alcohol use. He reports that he does not use drugs.  Allergies: Allergies  Allergen Reactions  . Simvastatin     REACTION: Rash    Family History:  Family History  Problem Relation Age of Onset  . Gastric cancer Father        died age 36  . Stroke Father   . Heart disease Mother   . Colon cancer Sister 47  . Other Brother        prostate issues, x 2 brothers     Current Outpatient Medications:  .  ALPRAZolam (XANAX) 0.5 MG tablet, TAKE 1 TABLET(0.5 MG) BY MOUTH THREE TIMES DAILY AS NEEDED, Disp: 90 tablet, Rfl: 0 .  atorvastatin (LIPITOR) 20 MG tablet, Take 1 tablet (20 mg total) by mouth daily at 6 PM. 1 tablet daily, Disp: 90 tablet, Rfl: 1 .  dutasteride (AVODART) 0.5 MG capsule, Take 1 capsule (0.5 mg total) by mouth daily., Disp: 90 capsule, Rfl: 1 .  eszopiclone (LUNESTA) 2 MG TABS tablet, Take 1.5 tablets (3 mg total) by mouth at bedtime as needed for sleep. Take immediately before bedtime, Disp: 45 tablet, Rfl: 1 .  metFORMIN (GLUCOPHAGE) 500 MG tablet, Take 1 tablet (  500 mg total) by mouth 2 (two) times daily with a meal., Disp: 180 tablet, Rfl: 1 .  omeprazole (PRILOSEC) 20 MG capsule, Take 1 capsule (20 mg total) by mouth daily., Disp: 90 capsule, Rfl: 1 .  sertraline (ZOLOFT) 100 MG tablet, TAKE 1 TABLET(100 MG) BY MOUTH DAILY, Disp: 30 tablet, Rfl: 0  Review of Systems:  Constitutional: Denies fever, chills, diaphoresis, appetite change and fatigue.  HEENT: Denies photophobia, eye pain, redness, hearing loss, ear pain, congestion, sore throat, rhinorrhea, sneezing, mouth sores, trouble swallowing, neck pain, neck stiffness and tinnitus.   Respiratory: Denies SOB, DOE, cough, chest tightness,  and wheezing.   Cardiovascular: Denies chest pain, palpitations and leg swelling.  Gastrointestinal: Denies nausea, vomiting, abdominal pain, diarrhea,  constipation, blood in stool and abdominal distention.  Genitourinary: Denies dysuria, urgency, frequency, hematuria, flank pain and difficulty urinating.  Endocrine: Denies: hot or cold intolerance, sweats, changes in hair or nails, polyuria, polydipsia. Musculoskeletal: Denies myalgias, back pain, joint swelling, arthralgias and gait problem.  Skin: Denies pallor, rash and wound.  Neurological: Denies dizziness, seizures, syncope, weakness, light-headedness, numbness and headaches.  Hematological: Denies adenopathy. Easy bruising, personal or family bleeding history  Psychiatric/Behavioral: Denies suicidal ideation, mood changes, confusion, nervousness, sleep disturbance and agitation    Physical Exam: Vitals:   01/25/19 0912  BP: 102/64  Pulse: 86  Temp: 97.8 F (36.6 C)  TempSrc: Temporal  SpO2: 94%  Weight: 188 lb 11.2 oz (85.6 kg)    Body mass index is 27.47 kg/m.   Constitutional: NAD, calm, comfortable Eyes: PERRL, lids and conjunctivae normal ENMT: Mucous membranes are moist.  Respiratory: clear to auscultation bilaterally, no wheezing, no crackles. Normal respiratory effort. No accessory muscle use.  Cardiovascular: Regular rate and rhythm, no murmurs / rubs / gallops. No extremity edema. 2+ pedal pulses. No carotid bruits.  Abdomen: no tenderness, no masses palpated. No hepatosplenomegaly. Bowel sounds positive.  Musculoskeletal: no clubbing / cyanosis. No joint deformity upper and lower extremities. Good ROM, no contractures. Normal muscle tone.  Skin: no rashes, lesions, ulcers. No induration Neurologic: Grossly intact and nonfocal Psychiatric: Normal judgment and insight. Alert and oriented x 3. Normal mood.    Impression and Plan:  NASH (nonalcoholic steatohepatitis) -LFTs looked good at last physical, continue statin, weight loss.  Insomnia, unspecified type -Will refill Lunesta and Xanax.  HYPERCHOLESTEROLEMIA -Check lipids today. -Continue Lipitor 20  mg daily.  Type 2 diabetes mellitus without complication, without long-term current use of insulin ([place]) -He has done a great job of dropping his A1c from 6.6-5.5. -Continue metformin 500 mg twice daily and diet modification.    Patient Instructions  -Nice seeing you today!!  -Lab work today; will notify you once results are available.  -Great job on weight loss and lowering your A1c!!  -Schedule follow up in 4 months.     Lelon Frohlich, MD [facility] at [facility]

## 2019-01-25 NOTE — Addendum Note (Signed)
Addended by: Westley Hummer B on: 01/25/2019 04:48 PM   Modules accepted: Orders

## 2019-01-25 NOTE — Patient Instructions (Signed)
-  Nice seeing you today!!  -Lab work today; will notify you once results are available.  -Great job on weight loss and lowering your A1c!!  -Schedule follow up in 4 months.

## 2019-01-30 ENCOUNTER — Telehealth: Payer: Self-pay | Admitting: Internal Medicine

## 2019-01-30 DIAGNOSIS — E119 Type 2 diabetes mellitus without complications: Secondary | ICD-10-CM

## 2019-01-30 NOTE — Telephone Encounter (Signed)
Dr. Jerilee Hoh,  I see no mention of this in the last OV note. Does he need this with an A1C being at 5.5?

## 2019-01-30 NOTE — Telephone Encounter (Signed)
Patient stated he was in the office on 01/25/2019 and was suppose to have a prescription called in to his preferred pharmacy [facility] on [address]. [phone] for a Glucose machine and strips, but the pharmacy said they don't have a prescription.

## 2019-01-31 MED ORDER — ACCU-CHEK FASTCLIX LANCETS MISC: 1.0000 | Freq: Two times a day (BID) | 12 refills | Status: DC

## 2019-01-31 MED ORDER — ACCU-CHEK GUIDE ME W/DEVICE KIT: 1.0000 | PACK | 0 refills | Status: DC

## 2019-01-31 MED ORDER — ACCU-CHEK GUIDE VI STRP: ORAL_STRIP | 12 refills | Status: DC

## 2019-01-31 NOTE — Telephone Encounter (Signed)
I was concerned with his rapid drop in A1c that he might be having hypoglycemia, which is why I suggested checking CBGs at home. Can we send a Rx for a glucometer with strips and lancets?

## 2019-01-31 NOTE — Telephone Encounter (Signed)
Meter and supplies sent to pharmacy

## 2019-02-08 ENCOUNTER — Other Ambulatory Visit: Payer: Self-pay | Admitting: Internal Medicine

## 2019-02-11 ENCOUNTER — Encounter: Payer: Self-pay | Admitting: Internal Medicine

## 2019-03-01 ENCOUNTER — Other Ambulatory Visit: Payer: Self-pay | Admitting: *Deleted

## 2019-03-01 MED ORDER — SERTRALINE HCL 100 MG PO TABS: ORAL_TABLET | ORAL | 1 refills | Status: DC

## 2019-03-14 ENCOUNTER — Encounter: Payer: Self-pay | Admitting: Internal Medicine

## 2019-03-16 ENCOUNTER — Other Ambulatory Visit: Payer: Self-pay | Admitting: Internal Medicine

## 2019-03-16 DIAGNOSIS — G4700 Insomnia, unspecified: Secondary | ICD-10-CM

## 2019-03-17 MED ORDER — ALPRAZOLAM 0.5 MG PO TABS: ORAL_TABLET | ORAL | 0 refills | Status: DC

## 2019-03-22 ENCOUNTER — Encounter: Payer: Self-pay | Admitting: Internal Medicine

## 2019-03-30 ENCOUNTER — Other Ambulatory Visit: Payer: Self-pay | Admitting: Internal Medicine

## 2019-03-30 DIAGNOSIS — E119 Type 2 diabetes mellitus without complications: Secondary | ICD-10-CM

## 2019-04-14 ENCOUNTER — Other Ambulatory Visit: Payer: Self-pay

## 2019-04-14 DIAGNOSIS — Z20822 Contact with and (suspected) exposure to covid-19: Secondary | ICD-10-CM

## 2019-04-16 ENCOUNTER — Encounter: Payer: Self-pay | Admitting: Internal Medicine

## 2019-04-16 LAB — NOVEL CORONAVIRUS, NAA: SARS-CoV-2, NAA: DETECTED — AB

## 2019-04-17 ENCOUNTER — Telehealth: Payer: Self-pay | Admitting: Nurse Practitioner

## 2019-04-17 ENCOUNTER — Telehealth: Payer: Self-pay

## 2019-04-17 NOTE — Telephone Encounter (Signed)
Went to a wedding last Friday where he was exposed.  Was tested for covid. 1 test out of 6 came back positive.  No current symptoms.  Virtual appointment ahs been scheduled.

## 2019-04-17 NOTE — Telephone Encounter (Signed)
Called to Discuss with patient about Covid symptoms and the use of bamlanivimab, a monoclonal antibody infusion for those with mild to moderate Covid symptoms and at a high risk of hospitalization.     Pt is qualified for this infusion at the [facility] infusion center due to co-morbid conditions and/or a member of an at-risk group.    Patient is managed for the following problems:  Patient Active Problem List   Diagnosis Date Noted  . DM (diabetes mellitus), type 2 ([place]) 10/20/2018  . DOE (dyspnea on exertion) 10/29/2017  . Weight gain 09/28/2017  . NASH (nonalcoholic steatohepatitis) 09/28/2017  . Bilateral shoulder pain 08/13/2016  . Rectal bleeding 02/03/2016  . Wellness examination 09/26/2015  . Screening for prostate cancer 09/03/2014  . Low back pain 08/31/2013  . Arthralgia 10/30/2009  . FOOT PAIN 10/30/2009  . HEARING LOSS 06/19/2008  . GERD 06/19/2008  . 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 INSOMNIA 06/19/2008  . 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 HYPERCHOLESTEROLEMIA 09/01/2007  . DYSPNEA 09/01/2007  . Hyperglycemia 09/01/2007  . Anxiety state 01/03/2007      Unable to reach pt

## 2019-04-21 ENCOUNTER — Telehealth: Payer: Self-pay | Admitting: Nurse Practitioner

## 2019-04-21 ENCOUNTER — Telehealth (INDEPENDENT_AMBULATORY_CARE_PROVIDER_SITE_OTHER): Payer: BC Managed Care – PPO | Admitting: Internal Medicine

## 2019-04-21 ENCOUNTER — Telehealth: Payer: Self-pay

## 2019-04-21 ENCOUNTER — Other Ambulatory Visit: Payer: Self-pay

## 2019-04-21 ENCOUNTER — Other Ambulatory Visit: Payer: Self-pay | Admitting: Internal Medicine

## 2019-04-21 DIAGNOSIS — U071 COVID-19: Secondary | ICD-10-CM | POA: Diagnosis not present

## 2019-04-21 DIAGNOSIS — E119 Type 2 diabetes mellitus without complications: Secondary | ICD-10-CM

## 2019-04-21 DIAGNOSIS — G4700 Insomnia, unspecified: Secondary | ICD-10-CM

## 2019-04-21 MED ORDER — ALPRAZOLAM 0.5 MG PO TABS: ORAL_TABLET | ORAL | 2 refills | Status: DC

## 2019-04-21 MED ORDER — ACCU-CHEK GUIDE ME W/DEVICE KIT: 1.0000 | PACK | 0 refills | Status: DC

## 2019-04-21 NOTE — Telephone Encounter (Signed)
Our office does not coordinate infusions. Will route to [place].

## 2019-04-21 NOTE — Telephone Encounter (Signed)
Called the information number for the [place] treatment for Covid. Was prompted to leave the patients info on voicemail. Left a message with patient info, and explaining that the patient would like to receive this treatment. Asked for a call back for assistance in getting this ordered.

## 2019-04-21 NOTE — Progress Notes (Signed)
Virtual Visit via Video Note  I connected with Luis Peterson on 04/21/19 at  3:30 PM EST by a video enabled telemedicine application and verified that I am speaking with the correct person using two identifiers.  Location patient: home Location provider: work office Persons participating in the virtual visit: patient, provider  I discussed the limitations of evaluation and management by telemedicine and the availability of in person appointments. The patient expressed understanding and agreed to proceed.   HPI: On black Friday Luis Peterson went to a nephew's wedding in [place].  2 days after the wedding the family was informed that one of the family members had tested positive for Covid.  2 days after this he started having fevers up to 101.6, sore throat, nasal congestion, cough and chest congestion.  He has been quarantining at home since.  His family is staying with relatives.  He is feeling improved.  He brought a pulse oximeter and his O2 sats have been in the 97 to 98% range.  He has been managing with over-the-counter therapies and feels like he is improved.  He is wondering when he can release his isolation.  He has been asymptomatic for about 5 days.  He received a call from [facility] about qualifying for a bamlanivimab infusion that is documented in epic, however he was unreachable at that time and wonders what he needs to do to go about setting this up.   ROS: Constitutional: Denies fever, chills, diaphoresis, appetite change and fatigue.  HEENT: Denies photophobia, eye pain, redness, hearing loss, ear pain, congestion, sore throat, rhinorrhea, sneezing, mouth sores, trouble swallowing, neck pain, neck stiffness and tinnitus.   Respiratory: Denies SOB, DOE, cough, chest tightness,  and wheezing.   Cardiovascular: Denies chest pain, palpitations and leg swelling.  Gastrointestinal: Denies nausea, vomiting, abdominal pain, diarrhea, constipation, blood in stool and abdominal distention.    Genitourinary: Denies dysuria, urgency, frequency, hematuria, flank pain and difficulty urinating.  Endocrine: Denies: hot or cold intolerance, sweats, changes in hair or nails, polyuria, polydipsia. Musculoskeletal: Denies myalgias, back pain, joint swelling, arthralgias and gait problem.  Skin: Denies pallor, rash and wound.  Neurological: Denies dizziness, seizures, syncope, weakness, light-headedness, numbness and headaches.  Hematological: Denies adenopathy. Easy bruising, personal or family bleeding history  Psychiatric/Behavioral: Denies suicidal ideation, mood changes, confusion, nervousness, sleep disturbance and agitation   Past Medical History:  Diagnosis Date  . ANXIETY 01/03/2007   Qualifier: Diagnosis of  By: Loanne Drilling MD, Jacelyn Pi   . 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 Consuella Lose  . Degenerative joint disease of cervical spine   . Depression   . DYSPNEA 09/01/2007   Qualifier: Diagnosis of  By: Loanne Drilling MD, Jacelyn Pi   . GERD 06/19/2008   Qualifier: Diagnosis of  By: Loanne Drilling MD, Jacelyn Pi   . HEARING LOSS 06/19/2008   Qualifier: Diagnosis of  By: Loanne Drilling MD, Jacelyn Pi   . HYPERCHOLESTEROLEMIA 09/01/2007   Qualifier: Diagnosis of  By: Loanne Drilling MD, Jacelyn Pi INSOMNIA 06/19/2008   Qualifier: Diagnosis of  By: Loanne Drilling MD, Jone Baseman, SPINE 06/19/2008   Qualifier: Diagnosis of  By: Loanne Drilling MD, Jacelyn Pi Positive H. pylori test 07/1997  . 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 COLONOSCOPY    . ELECTROCARDIOGRAM  05/28/2006  .  Stress Cardiolite  08/22/2003  . TYMPANOPLASTY  03/1996   Right    Family History  Problem Relation Age of Onset  . Gastric cancer Father        died age 74  . Stroke Father   . Heart disease Mother   . Colon cancer Sister 36  . Other Brother        prostate issues, x 2 brothers    SOCIAL HX:   reports that he has quit smoking.  His smoking use included cigarettes. He quit after 5.00 years of use. He has never used smokeless tobacco. He reports current alcohol use. He reports that he does not use drugs.   Current Outpatient Medications:  .  Accu-Chek FastClix Lancets MISC, 1 Stick by Does not apply route 2 (two) times daily., Disp: 100 each, Rfl: 12 .  ALPRAZolam (XANAX) 0.5 MG tablet, TAKE 1 TABLET(0.5 MG) BY MOUTH THREE TIMES DAILY AS NEEDED, Disp: 90 tablet, Rfl: 0 .  atorvastatin (LIPITOR) 20 MG tablet, Take 1 tablet (20 mg total) by mouth daily at 6 PM. 1 tablet daily, Disp: 90 tablet, Rfl: 1 .  Blood Glucose Monitoring Suppl (ACCU-CHEK GUIDE ME) w/Device KIT, 1 kit by Does not apply route as directed., Disp: 1 kit, Rfl: 0 .  dutasteride (AVODART) 0.5 MG capsule, Take 1 capsule (0.5 mg total) by mouth daily., Disp: 90 capsule, Rfl: 1 .  eszopiclone (LUNESTA) 2 MG TABS tablet, Take 1.5 tablets (3 mg total) by mouth at bedtime as needed for sleep. Take immediately before bedtime, Disp: 45 tablet, Rfl: 1 .  glucose blood (ACCU-CHEK GUIDE) test strip, Use as instructed, Disp: 100 each, Rfl: 12 .  metFORMIN (GLUCOPHAGE) 500 MG tablet, TAKE 1 TABLET(500 MG) BY MOUTH TWICE DAILY WITH A MEAL, Disp: 180 tablet, Rfl: 1 .  omeprazole (PRILOSEC) 20 MG capsule, Take 1 capsule (20 mg total) by mouth daily., Disp: 90 capsule, Rfl: 1 .  sertraline (ZOLOFT) 100 MG tablet, TAKE 1 TABLET(100 MG) BY MOUTH DAILY, Disp: 90 tablet, Rfl: 1 .  tamsulosin (FLOMAX) 0.4 MG CAPS capsule, TAKE 2 CAPSULES BY MOUTH DAILY, Disp: 180 capsule, Rfl: 1  EXAM:   VITALS per patient if applicable: O2 sat of 85% today  GENERAL: alert, oriented, appears well and in no acute distress  HEENT: atraumatic, conjunttiva clear, no obvious abnormalities on inspection of external nose and ears  NECK: normal movements of the head and neck  LUNGS: on inspection no signs of respiratory distress, breathing rate appears normal, no obvious gross increased work of  breathing, gasping or wheezing  CV: no obvious cyanosis  MS: moves all visible extremities without noticeable abnormality  PSYCH/NEURO: pleasant and cooperative, no obvious depression or anxiety, speech and thought processing grossly intact  ASSESSMENT AND PLAN:   COVID-19 -His symptoms appear to have resolved at this point. -He is past his quarantine period and he may now be released from quarantine. -Have left message with his information at the infusion center to see about bamlanivimab infusion. -Have advised continued 3Ws.    I discussed the assessment and treatment plan with the patient. The patient was provided an opportunity to ask questions and all were answered. The patient agreed with the plan and demonstrated an understanding of the instructions.   The patient was advised to call back or seek an in-person evaluation if the symptoms worsen or if the condition fails to improve as anticipated.    Lelon Frohlich, MD  [facility]

## 2019-04-24 ENCOUNTER — Telehealth: Payer: Self-pay | Admitting: Nurse Practitioner

## 2019-04-24 ENCOUNTER — Telehealth: Payer: Self-pay | Admitting: *Deleted

## 2019-04-24 NOTE — Telephone Encounter (Signed)
Copied from [place] [id number]. Topic: Referral - Status >> Apr 24, 2019  9:34 AM Reyne Dumas L wrote: Reason for CRM:  Pt states that Dr. Jerilee Hoh told him that since he has COVID he will be getting a call to schedule antibody infusion.  Pt wants to know when that will happen. Pt can be reached at [phone].

## 2019-04-24 NOTE — Telephone Encounter (Signed)
Called to Discuss with patient about Covid symptoms and the use of bamlanivimab, a monoclonal antibody infusion for those with mild to moderate Covid symptoms and at a high risk of hospitalization.     Pt is qualified for this infusion at the [facility] infusion center due to co-morbid conditions and/or a member of an at-risk group.   Patient Active Problem List   Diagnosis Date Noted  . DM (diabetes mellitus), type 2 ([place]) 10/20/2018  . DOE (dyspnea on exertion) 10/29/2017  . Weight gain 09/28/2017  . NASH (nonalcoholic steatohepatitis) 09/28/2017  . Bilateral shoulder pain 08/13/2016  . Rectal bleeding 02/03/2016  . Wellness examination 09/26/2015  . Screening for prostate cancer 09/03/2014  . Low back pain 08/31/2013  . Arthralgia 10/30/2009  . FOOT PAIN 10/30/2009  . HEARING LOSS 06/19/2008  . GERD 06/19/2008  . 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 INSOMNIA 06/19/2008  . 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 HYPERCHOLESTEROLEMIA 09/01/2007  . DYSPNEA 09/01/2007  . Hyperglycemia 09/01/2007  . Anxiety state 01/03/2007      Patient is more than 10 days out on symptoms at this point. Symptoms started on 04/09/19. He states that he is much improved at this point. No longer qualifies for infusion.

## 2019-04-25 ENCOUNTER — Telehealth: Payer: Self-pay | Admitting: Unknown Physician Specialty

## 2019-04-25 NOTE — Telephone Encounter (Signed)
Called to discuss with patient about Covid symptoms and the use of bamlanivimab, a monoclonal antibody infusion for those with mild to moderate Covid symptoms and at a high risk of hospitalization.  Pt is qualified for this infusion at the [facility] infusion center due to Diabetes   Message left to call back

## 2019-04-25 NOTE — Telephone Encounter (Signed)
Looks like he was called by the NP that heads up this program and was notified that he no longer qualified for the infusion per notes in Epic.  Luis Peterson

## 2019-04-25 NOTE — Telephone Encounter (Signed)
Discussed with patient about Covid symptoms and the use of bamlanivimab, a monoclonal antibody infusion for those with mild to moderate Covid symptoms and at a high risk of hospitalization.  Pt is not qualified for this infusion at the [facility] infusion center due sx's greater than 10 days.

## 2019-05-10 ENCOUNTER — Telehealth: Payer: Self-pay | Admitting: *Deleted

## 2019-05-10 NOTE — Telephone Encounter (Signed)
Prior Luis Peterson has been approved for Lunesta as long as it is generic.    Left message on machine for pharmacy.

## 2019-05-23 ENCOUNTER — Encounter: Payer: Self-pay | Admitting: Internal Medicine

## 2019-07-25 ENCOUNTER — Other Ambulatory Visit: Payer: Self-pay | Admitting: Internal Medicine

## 2019-07-25 DIAGNOSIS — G4700 Insomnia, unspecified: Secondary | ICD-10-CM

## 2019-07-27 ENCOUNTER — Other Ambulatory Visit: Payer: Self-pay

## 2019-07-27 ENCOUNTER — Encounter: Payer: Self-pay | Admitting: Cardiology

## 2019-07-27 ENCOUNTER — Ambulatory Visit: Payer: BC Managed Care – PPO | Admitting: Cardiology

## 2019-07-27 VITALS — BP 110/74 | HR 82 | Ht 69.5 in | Wt 194.0 lb

## 2019-07-27 DIAGNOSIS — E119 Type 2 diabetes mellitus without complications: Secondary | ICD-10-CM | POA: Diagnosis not present

## 2019-07-27 DIAGNOSIS — R0609 Other forms of dyspnea: Secondary | ICD-10-CM

## 2019-07-27 DIAGNOSIS — Z1329 Encounter for screening for other suspected endocrine disorder: Secondary | ICD-10-CM

## 2019-07-27 DIAGNOSIS — R0600 Dyspnea, unspecified: Secondary | ICD-10-CM | POA: Diagnosis not present

## 2019-07-27 DIAGNOSIS — E7800 Pure hypercholesterolemia, unspecified: Secondary | ICD-10-CM

## 2019-07-27 DIAGNOSIS — E1100 Type 2 diabetes mellitus with hyperosmolarity without nonketotic hyperglycemic-hyperosmolar coma (NKHHC): Secondary | ICD-10-CM

## 2019-07-27 NOTE — Patient Instructions (Signed)
Medication Instructions:  No medication changes *If you need a refill on your cardiac medications before your next appointment, please call your pharmacy*   Lab Work: In the next few days, you need to have labs done when you are fasting.  You can come Monday through Friday 8:30 am to 12:00 pm and 1:15 to 4:30. You do not need to make an appointment as the order has already been placed. The labs you are going to have done are BMET, CBC, TSH, LFT and Lipids. If you have labs (blood work) drawn today and your tests are completely normal, you will receive your results only by: Marland Kitchen MyChart Message (if you have MyChart) OR . A paper copy in the mail If you have any lab test that is abnormal or we need to change your treatment, we will call you to review the results.   Testing/Procedures: None ordered   Follow-Up: At [facility], you and your health needs are our priority.  As part of our continuing mission to provide you with exceptional heart care, we have created designated Provider Care Teams.  These Care Teams include your primary Cardiologist (physician) and Advanced Practice Providers (APPs -  Physician Assistants and Nurse Practitioners) who all work together to provide you with the care you need, when you need it.  We recommend signing up for the patient portal called "MyChart".  Sign up information is provided on this After Visit Summary.  MyChart is used to connect with patients for Virtual Visits (Telemedicine).  Patients are able to view lab/test results, encounter notes, upcoming appointments, etc.  Non-urgent messages can be sent to your provider as well.   To learn more about what you can do with MyChart, go to [email or web address].    Your next appointment:   6 month(s)  The format for your next appointment:   In Person  Provider:   Jyl Heinz, MD   Other Instructions NA

## 2019-07-27 NOTE — Progress Notes (Signed)
Cardiology Office Note:    Date:  07/27/2019   ID:  WAGNER TANZI, DOB 1956/03/25, MRN [id number]  PCP:  Isaac Bliss, Rayford Halsted, MD  Cardiologist:  Jenean Lindau, MD   Referring MD: Isaac Bliss, [place]*    ASSESSMENT:    1. Thyroid disorder screen   2. Type 2 diabetes mellitus without complication, without long-term current use of insulin ([place])   3. HYPERCHOLESTEROLEMIA   4. DOE (dyspnea on exertion)   5. Type 2 diabetes mellitus with hyperosmolarity without coma, without long-term current use of insulin ([facility])    PLAN:    In order of problems listed above:  1. Primary prevention stressed with the patient.  Importance of compliance with diet and medication stressed and he vocalized understanding. 2. Diabetes mellitus: Managed by his primary care physician.  Diet emphasized.  I told him to walk at least 5 to 6 days a week 30 minutes a day and he promises to do so. 3. Dyspnea on exertion: This has resolved.  He mentions to me that when he walks about half an hour 2-3 times a week he has no issues. 4. Patient will be seen in follow-up appointment in 3 months or earlier if the patient has any concerns 5. He will have complete blood work including fasting before his next appointment.  Patient had multiple questions which were answered to his satisfaction.   Medication Adjustments/Labs and Tests Ordered: Current medicines are reviewed at length with the patient today.  Concerns regarding medicines are outlined above.  Orders Placed This Encounter  Procedures  . Basic metabolic panel  . CBC with Differential/Platelet  . Hepatic function panel  . Lipid panel  . TSH   No orders of the defined types were placed in this encounter.    Chief Complaint  Patient presents with  . Follow-up     History of Present Illness:    Luis Peterson is a 64 y.o. male.  Patient has past medical history of diabetes mellitus and dyslipidemia.  He is on statin therapy and walks about 2  miles a day 2-3 times a week without any problems.  No chest pain orthopnea or PND.  He gives history of some shortness of breath when he does yard work only.  At the time of my evaluation, the patient is alert awake oriented and in no distress.  Past Medical History:  Diagnosis Date  . ANXIETY 01/03/2007   Qualifier: Diagnosis of  By: Loanne Drilling MD, Jacelyn Pi   . Anxiety state 01/03/2007   Qualifier: Diagnosis of  By: Loanne Drilling MD, Jacelyn Pi   . Arthralgia 10/30/2009   Qualifier: Diagnosis of  By: Loanne Drilling MD, Jacelyn Pi   . 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 Consuella Lose  . Degenerative joint disease of cervical spine   . Depression   . DM (diabetes mellitus), type 2 ([place]) 10/20/2018  . DOE (dyspnea on exertion) 10/29/2017  . DYSPNEA 09/01/2007   Qualifier: Diagnosis of  By: Loanne Drilling MD, Jacelyn Pi   . FOOT PAIN 10/30/2009   Qualifier: Diagnosis of  By: Loanne Drilling MD, Jacelyn Pi   . GERD 06/19/2008   Qualifier: Diagnosis of  By: Loanne Drilling MD, Jacelyn Pi   . HEARING LOSS 06/19/2008   Qualifier: Diagnosis of  By: Loanne Drilling MD, Jacelyn Pi   . HYPERCHOLESTEROLEMIA 09/01/2007   Qualifier: Diagnosis of  By: Loanne Drilling MD, Jacelyn Pi   . Hyperglycemia 09/01/2007   Qualifier: Diagnosis of  By: Loanne Drilling MD, [place] 06/19/2008   Qualifier: Diagnosis of  By: Loanne Drilling MD, Jacelyn Pi Low back pain 08/31/2013  . NASH (nonalcoholic steatohepatitis) 09/28/2017  . 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 Positive H. pylori test 07/1997  . 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 Rectal bleeding 02/03/2016  . Screening for prostate cancer 09/03/2014  . Weight gain 09/28/2017    Past Surgical History:  Procedure Laterality Date  . COLONOSCOPY    . ELECTROCARDIOGRAM  05/28/2006  . Stress Cardiolite  08/22/2003  . TYMPANOPLASTY  03/1996   Right    Current Medications: Current Meds  Medication Sig  . Accu-Chek FastClix Lancets MISC 1 Stick by  Does not apply route 2 (two) times daily.  Marland Kitchen alfuzosin (UROXATRAL) 10 MG 24 hr tablet Take 10 mg by mouth daily.  Marland Kitchen ALPRAZolam (XANAX) 0.5 MG tablet TAKE 1 TABLET(0.5 MG) BY MOUTH THREE TIMES DAILY AS NEEDED  . atorvastatin (LIPITOR) 20 MG tablet Take 1 tablet (20 mg total) by mouth daily at 6 PM. 1 tablet daily  . Blood Glucose Monitoring Suppl (ACCU-CHEK GUIDE ME) w/Device KIT 1 kit by Does not apply route as directed.  . dutasteride (AVODART) 0.5 MG capsule Take 1 capsule (0.5 mg total) by mouth daily.  . eszopiclone (LUNESTA) 2 MG TABS tablet TAKE 1.5 TABLETS BY MOUTH IMMEDIATELY BEFORE BEDTIME AS NEEDED FOR SLEEP  . glucose blood (ACCU-CHEK GUIDE) test strip Use as instructed  . metFORMIN (GLUCOPHAGE) 500 MG tablet TAKE 1 TABLET(500 MG) BY MOUTH TWICE DAILY WITH A MEAL  . MYRBETRIQ 50 MG TB24 tablet Take 50 mg by mouth daily.  Marland Kitchen omeprazole (PRILOSEC) 20 MG capsule Take 1 capsule (20 mg total) by mouth daily.  . sertraline (ZOLOFT) 100 MG tablet TAKE 1 TABLET(100 MG) BY MOUTH DAILY  . tamsulosin (FLOMAX) 0.4 MG CAPS capsule TAKE 2 CAPSULES BY MOUTH DAILY     Allergies:   Simvastatin   Social History   Socioeconomic History  . Marital status: Married    Spouse name: Not on file  . Number of children: 1  . Years of education: Not on file  . Highest education level: Not on file  Occupational History  . Occupation: Secretary/administrator Professor     Employer: [employer]  Tobacco Use  . Smoking status: Former Smoker    Years: 5.00    Types: Cigarettes  . Smokeless tobacco: Never Used  Substance and Sexual Activity  . Alcohol use: Yes    Comment: social  . Drug use: No  . Sexual activity: Not on file  Other Topics Concern  . Not on file  Social History Narrative  . Not on file   Social Determinants of Health   Financial Resource Strain:   . Difficulty of Paying Living Expenses:   Food Insecurity:   . Worried About Charity fundraiser in the Last Year:   . Arboriculturist in the  Last Year:   Transportation Needs:   . Film/video editor (Medical):   Marland Kitchen Lack of Transportation (Non-Medical):   Physical Activity:   . Days of Exercise per Week:   . Minutes of Exercise per Session:   Stress:   . Feeling of Stress :   Social Connections:   . Frequency of Communication with Friends and Family:   .  Frequency of Social Gatherings with Friends and Family:   . Attends Religious Services:   . Active Member of Clubs or Organizations:   . Attends Archivist Meetings:   Marland Kitchen Marital Status:      Family History: The patient's family history includes Colon cancer (age of onset: 62) in his sister; Gastric cancer in his father; Heart disease in his mother; Other in his brother; Stroke in his father.  ROS:   Please see the history of present illness.    All other systems reviewed and are negative.  EKGs/Labs/Other Studies Reviewed:    The following studies were reviewed today: I discussed my findings with the patient at length including lipids   Recent Labs: 10/19/2018: ALT 32; BUN 11; Creatinine, Ser 0.71; Hemoglobin 14.8; Platelets 202.0; Potassium 4.0; Sodium 137; TSH 0.94  Recent Lipid Panel    Component Value Date/Time   CHOL 123 01/25/2019 0945   TRIG 106.0 01/25/2019 0945   TRIG 160 (H) 05/21/2006 0911   HDL 49.80 01/25/2019 0945   CHOLHDL 2 01/25/2019 0945   VLDL 21.2 01/25/2019 0945   LDLCALC 52 01/25/2019 0945   LDLDIRECT 81.0 10/19/2018 1024    Physical Exam:    VS:  BP 110/74   Pulse 82   Ht 5' 9.5" (1.765 m)   Wt 194 lb (88 kg)   SpO2 96%   BMI 28.24 kg/m     Wt Readings from Last 3 Encounters:  07/27/19 194 lb (88 kg)  01/25/19 188 lb 11.2 oz (85.6 kg)  10/19/18 206 lb (93.4 kg)     GEN: Patient is in no acute distress HEENT: Normal NECK: No JVD; No carotid bruits LYMPHATICS: No lymphadenopathy CARDIAC: Hear sounds regular, 2/6 systolic murmur at the apex. RESPIRATORY:  Clear to auscultation without rales, wheezing or  rhonchi  ABDOMEN: Soft, non-tender, non-distended MUSCULOSKELETAL:  No edema; No deformity  SKIN: Warm and dry NEUROLOGIC:  Alert and oriented x 3 PSYCHIATRIC:  Normal affect   Signed, Jenean Lindau, MD  07/27/2019 9:12 AM    [place]

## 2019-07-30 ENCOUNTER — Other Ambulatory Visit: Payer: Self-pay | Admitting: Internal Medicine

## 2019-07-30 DIAGNOSIS — G4700 Insomnia, unspecified: Secondary | ICD-10-CM

## 2019-08-04 ENCOUNTER — Other Ambulatory Visit: Payer: Self-pay | Admitting: Internal Medicine

## 2019-08-25 LAB — BASIC METABOLIC PANEL
BUN/Creatinine Ratio: 16 (ref 10–24)
BUN: 12 mg/dL (ref 8–27)
CO2: 21 mmol/L (ref 20–29)
Calcium: 10.3 mg/dL — ABNORMAL HIGH (ref 8.6–10.2)
Chloride: 100 mmol/L (ref 96–106)
Creatinine, Ser: 0.77 mg/dL (ref 0.76–1.27)
GFR calc Af Amer: 112 mL/min/{1.73_m2} (ref >59)
GFR calc non Af Amer: 97 mL/min/{1.73_m2} (ref >59)
Glucose: 103 mg/dL — ABNORMAL HIGH (ref 65–99)
Potassium: 4.3 mmol/L (ref 3.5–5.2)
Sodium: 136 mmol/L (ref 134–144)

## 2019-08-25 LAB — HEPATIC FUNCTION PANEL
ALT: 27 IU/L (ref 0–44)
AST: 24 IU/L (ref 0–40)
Albumin: 4.7 g/dL (ref 3.8–4.8)
Alkaline Phosphatase: 57 IU/L (ref 39–117)
Bilirubin Total: 0.5 mg/dL (ref 0.0–1.2)
Bilirubin, Direct: 0.15 mg/dL (ref 0.00–0.40)
Total Protein: 7.6 g/dL (ref 6.0–8.5)

## 2019-08-25 LAB — CBC WITH DIFFERENTIAL/PLATELET
Basophils Absolute: 0 10*3/uL (ref 0.0–0.2)
Basos: 0 %
EOS (ABSOLUTE): 0.5 10*3/uL — ABNORMAL HIGH (ref 0.0–0.4)
Eos: 7 %
Hematocrit: 42.2 % (ref 37.5–51.0)
Hemoglobin: 14.9 g/dL (ref 13.0–17.7)
Immature Grans (Abs): 0 10*3/uL (ref 0.0–0.1)
Immature Granulocytes: 0 %
Lymphocytes Absolute: 2.7 10*3/uL (ref 0.7–3.1)
Lymphs: 34 %
MCH: 30.2 pg (ref 26.6–33.0)
MCHC: 35.3 g/dL (ref 31.5–35.7)
MCV: 86 fL (ref 79–97)
Monocytes Absolute: 0.6 10*3/uL (ref 0.1–0.9)
Monocytes: 7 %
Neutrophils Absolute: 4.1 10*3/uL (ref 1.4–7.0)
Neutrophils: 52 %
Platelets: 196 10*3/uL (ref 150–450)
RBC: 4.93 x10E6/uL (ref 4.14–5.80)
RDW: 12 % (ref 11.6–15.4)
WBC: 8 10*3/uL (ref 3.4–10.8)

## 2019-08-25 LAB — LIPID PANEL
Chol/HDL Ratio: 2.6 ratio (ref 0.0–5.0)
Cholesterol, Total: 147 mg/dL (ref 100–199)
HDL: 56 mg/dL (ref >39)
LDL Chol Calc (NIH): 62 mg/dL (ref 0–99)
Triglycerides: 172 mg/dL — ABNORMAL HIGH (ref 0–149)
VLDL Cholesterol Cal: 29 mg/dL (ref 5–40)

## 2019-08-25 LAB — TSH: TSH: 1.06 u[IU]/mL (ref 0.450–4.500)

## 2019-08-25 NOTE — Addendum Note (Signed)
Addended by: Truddie Hidden on: 08/25/2019 01:42 PM   Modules accepted: Orders

## 2019-08-30 ENCOUNTER — Other Ambulatory Visit: Payer: Self-pay | Admitting: Internal Medicine

## 2019-08-30 DIAGNOSIS — E7800 Pure hypercholesterolemia, unspecified: Secondary | ICD-10-CM

## 2019-09-15 ENCOUNTER — Other Ambulatory Visit: Payer: Self-pay | Admitting: Internal Medicine

## 2019-09-27 ENCOUNTER — Other Ambulatory Visit: Payer: Self-pay | Admitting: Internal Medicine

## 2019-09-27 DIAGNOSIS — G4700 Insomnia, unspecified: Secondary | ICD-10-CM

## 2019-10-12 ENCOUNTER — Other Ambulatory Visit: Payer: Self-pay | Admitting: Internal Medicine

## 2019-10-12 DIAGNOSIS — E119 Type 2 diabetes mellitus without complications: Secondary | ICD-10-CM

## 2019-11-09 ENCOUNTER — Other Ambulatory Visit: Payer: Self-pay | Admitting: Internal Medicine

## 2019-11-09 DIAGNOSIS — G4700 Insomnia, unspecified: Secondary | ICD-10-CM

## 2019-11-10 ENCOUNTER — Telehealth: Payer: Self-pay | Admitting: Internal Medicine

## 2019-11-10 DIAGNOSIS — G4700 Insomnia, unspecified: Secondary | ICD-10-CM

## 2019-11-10 NOTE — Telephone Encounter (Signed)
Pt is calling because Luis Peterson [facility], prescribed Alprazolam 0.5 mg tablet because Dr. Jerilee Hoh, is out of the office. Per pt, the medication was sent wrong and needs a 90 day supply instead of a 30 day supply.

## 2019-11-14 NOTE — Addendum Note (Signed)
Addended by: Westley Hummer B on: 11/14/2019 04:19 PM   Modules accepted: Orders

## 2019-11-15 MED ORDER — ALPRAZOLAM 0.5 MG PO TABS: ORAL_TABLET | ORAL | 0 refills | Status: DC

## 2019-11-15 NOTE — Telephone Encounter (Signed)
Ok to send 90 day supply.

## 2019-11-15 NOTE — Addendum Note (Signed)
Addended by: Lelon Frohlich Y on: 11/15/2019 10:34 AM   Modules accepted: Orders

## 2019-12-05 ENCOUNTER — Encounter: Payer: Self-pay | Admitting: Internal Medicine

## 2019-12-05 DIAGNOSIS — G4700 Insomnia, unspecified: Secondary | ICD-10-CM

## 2019-12-07 MED ORDER — ESZOPICLONE 2 MG PO TABS: ORAL_TABLET | ORAL | 0 refills | Status: DC

## 2019-12-07 MED ORDER — ESZOPICLONE 1 MG PO TABS: 1.0000 mg | ORAL_TABLET | Freq: Every evening | ORAL | 0 refills | Status: DC | PRN

## 2019-12-13 ENCOUNTER — Telehealth: Payer: Self-pay | Admitting: *Deleted

## 2019-12-13 NOTE — Telephone Encounter (Signed)
Prior Auth started for  Lunesta 2 mg [id number]

## 2019-12-14 ENCOUNTER — Encounter: Payer: Self-pay | Admitting: *Deleted

## 2019-12-14 NOTE — Telephone Encounter (Signed)
Approved  12/13/19 - 12/13/22. Message sent to [place]

## 2020-01-19 ENCOUNTER — Other Ambulatory Visit: Payer: Self-pay | Admitting: Internal Medicine

## 2020-01-29 ENCOUNTER — Other Ambulatory Visit: Payer: Self-pay | Admitting: Internal Medicine

## 2020-01-29 DIAGNOSIS — E7800 Pure hypercholesterolemia, unspecified: Secondary | ICD-10-CM

## 2020-02-01 ENCOUNTER — Other Ambulatory Visit: Payer: Self-pay

## 2020-02-01 DIAGNOSIS — M47812 Spondylosis without myelopathy or radiculopathy, cervical region: Secondary | ICD-10-CM | POA: Insufficient documentation

## 2020-02-01 DIAGNOSIS — F32A Depression, unspecified: Secondary | ICD-10-CM | POA: Insufficient documentation

## 2020-02-02 ENCOUNTER — Ambulatory Visit: Payer: BC Managed Care – PPO | Admitting: Cardiology

## 2020-02-02 ENCOUNTER — Encounter: Payer: Self-pay | Admitting: Cardiology

## 2020-02-02 ENCOUNTER — Other Ambulatory Visit: Payer: Self-pay

## 2020-02-02 VITALS — BP 126/78 | HR 84 | Ht 70.0 in | Wt 199.1 lb

## 2020-02-02 DIAGNOSIS — E7800 Pure hypercholesterolemia, unspecified: Secondary | ICD-10-CM | POA: Diagnosis not present

## 2020-02-02 DIAGNOSIS — R0600 Dyspnea, unspecified: Secondary | ICD-10-CM

## 2020-02-02 DIAGNOSIS — E088 Diabetes mellitus due to underlying condition with unspecified complications: Secondary | ICD-10-CM | POA: Insufficient documentation

## 2020-02-02 DIAGNOSIS — R0609 Other forms of dyspnea: Secondary | ICD-10-CM

## 2020-02-02 HISTORY — DX: Diabetes mellitus due to underlying condition with unspecified complications: E08.8

## 2020-02-02 MED ORDER — METOPROLOL TARTRATE 100 MG PO TABS: 100.0000 mg | ORAL_TABLET | Freq: Once | ORAL | 0 refills | Status: DC

## 2020-02-02 NOTE — Patient Instructions (Addendum)
Medication Instructions:  No medication changes. *If you need a refill on your cardiac medications before your next appointment, please call your pharmacy*   Lab Work: Your physician recommends that you return for lab work in: 1 week prior to your CT scan. You need to have a BMET done. You can come Monday through Friday 8:30 am to 12:00 pm and 1:15 to 4:30. You do not need to make an appointment as the order has already been placed.    If you have labs (blood work) drawn today and your tests are completely normal, you will receive your results only by: Marland Kitchen MyChart Message (if you have MyChart) OR . A paper copy in the mail If you have any lab test that is abnormal or we need to change your treatment, we will call you to review the results.   Testing/Procedures: Your cardiac CT will be scheduled at:   [facility] [place], [place] [id number] [phone]   If scheduled at [facility], please arrive at the [facility] main entrance of [facility] 30 minutes prior to test start time. Proceed to the [facility] Radiology Department (first floor) to check-in and test prep.  Please follow these instructions carefully (unless otherwise directed):  Hold all erectile dysfunction medications at least 3 days (72 hrs) prior to test.  On the Night Before the Test: . Be sure to Drink plenty of water. . Do not consume any caffeinated/decaffeinated beverages or chocolate 12 hours prior to your test. . Do not take any antihistamines 12 hours prior to your test.  On the Day of the Test: . Drink plenty of water. Do not drink any water within one hour of the test. . Do not eat any food 4 hours prior to the test. . You may take your regular medications prior to the test.  . Take metoprolol (Lopressor) two hours prior to test.       After the Test: . Drink plenty of water. . After receiving IV contrast, you may experience a mild flushed feeling. This is  normal. . On occasion, you may experience a mild rash up to 24 hours after the test. This is not dangerous. If this occurs, you can take Benadryl 25 mg and increase your fluid intake. . If you experience trouble breathing, this can be serious. If it is severe call 911 IMMEDIATELY. If it is mild, please call our office. . If you take any of these medications: Glipizide/Metformin, Avandament, Glucavance, please do not take 48 hours after completing test unless otherwise instructed.   Once we have confirmed authorization from your insurance company, we will call you to set up a date and time for your test. Based on how quickly your insurance processes prior authorizations requests, please allow up to 4 weeks to be contacted for scheduling your Cardiac CT appointment. Be advised that routine Cardiac CT appointments could be scheduled as many as 8 weeks after your provider has ordered it.  For non-scheduling related questions, please contact the cardiac imaging nurse navigator should you have any questions/concerns: Marchia Bond, Cardiac Imaging Nurse Navigator Burley Saver, Interim Cardiac Imaging Nurse [place] [facility] Direct Office Dial: [phone]   For scheduling needs, including cancellations and rescheduling, please call Vivien Rota at [phone].  Follow-Up: At [facility], you and your health needs are our priority.  As part of our continuing mission to provide you with exceptional heart care, we have created designated Provider  Care Teams.  These Care Teams include your primary Cardiologist (physician) and Advanced Practice Providers (APPs -  Physician Assistants and Nurse Practitioners) who all work together to provide you with the care you need, when you need it.  We recommend signing up for the patient portal called "MyChart".  Sign up information is provided on this After Visit Summary.  MyChart is used to connect with patients for Virtual Visits  (Telemedicine).  Patients are able to view lab/test results, encounter notes, upcoming appointments, etc.  Non-urgent messages can be sent to your provider as well.   To learn more about what you can do with MyChart, go to [email or web address].    Your next appointment:   3 month(s)  The format for your next appointment:   In Person  Provider:   Jyl Heinz, MD   Other Instructions Cardiac CT Angiogram A cardiac CT angiogram is a procedure to look at the heart and the area around the heart. It may be done to help find the cause of chest pains or other symptoms of heart disease. During this procedure, a substance called contrast dye is injected into the blood vessels in the area to be checked. A large X-ray machine, called a CT scanner, then takes detailed pictures of the heart and the surrounding area. The procedure is also sometimes called a coronary CT angiogram, coronary artery scanning, or CTA. A cardiac CT angiogram allows the health care provider to see how well blood is flowing to and from the heart. The health care provider will be able to see if there are any problems, such as:  Blockage or narrowing of the coronary arteries in the heart.  Fluid around the heart.  Signs of weakness or disease in the muscles, valves, and tissues of the heart. Tell a health care provider about:  Any allergies you have. This is especially important if you have had a previous allergic reaction to contrast dye.  All medicines you are taking, including vitamins, herbs, eye drops, creams, and over-the-counter medicines.  Any blood disorders you have.  Any surgeries you have had.  Any medical conditions you have.  Whether you are pregnant or may be pregnant.  Any anxiety disorders, chronic pain, or other conditions you have that may increase your stress or prevent you from lying still. What are the risks? Generally, this is a safe procedure. However, problems may occur,  including: 1. Bleeding. 2. Infection. 3. Allergic reactions to medicines or dyes. 4. Damage to other structures or organs. 5. Kidney damage from the contrast dye that is used. 6. Increased risk of cancer from radiation exposure. This risk is low. Talk with your health care provider about: ? The risks and benefits of testing. ? How you can receive the lowest dose of radiation. What happens before the procedure? 1. Wear comfortable clothing and remove any jewelry, glasses, dentures, and hearing aids. 2. Follow instructions from your health care provider about eating and drinking. This may include: ? For 12 hours before the procedure -- avoid caffeine. This includes tea, coffee, soda, energy drinks, and diet pills. Drink plenty of water or other fluids that do not have caffeine in them. Being well hydrated can prevent complications. ? For 4-6 hours before the procedure -- stop eating and drinking. The contrast dye can cause nausea, but this is less likely if your stomach is empty. 3. Ask your health care provider about changing or stopping your regular medicines. This is especially important if you are taking diabetes medicines,  blood thinners, or medicines to treat problems with erections (erectile dysfunction). What happens during the procedure?  1. Hair on your chest may need to be removed so that small sticky patches called electrodes can be placed on your chest. These will transmit information that helps to monitor your heart during the procedure. 2. An IV will be inserted into one of your veins. 3. You might be given a medicine to control your heart rate during the procedure. This will help to ensure that good images are obtained. 4. You will be asked to lie on an exam table. This table will slide in and out of the CT machine during the procedure. 5. Contrast dye will be injected into the IV. You might feel warm, or you may get a metallic taste in your mouth. 6. You will be given a medicine  called nitroglycerin. This will relax or dilate the arteries in your heart. 7. The table that you are lying on will move into the CT machine tunnel for the scan. 8. The person running the machine will give you instructions while the scans are being done. You may be asked to: ? Keep your arms above your head. ? Hold your breath. ? Stay very still, even if the table is moving. 9. When the scanning is complete, you will be moved out of the machine. 10. The IV will be removed. The procedure may vary among health care providers and hospitals. What can I expect after the procedure? After your procedure, it is common to have:  A metallic taste in your mouth from the contrast dye.  A feeling of warmth.  A headache from the nitroglycerin. Follow these instructions at home:  Take over-the-counter and prescription medicines only as told by your health care provider.  If you are told, drink enough fluid to keep your urine pale yellow. This will help to flush the contrast dye out of your body.  Most people can return to their normal activities right after the procedure. Ask your health care provider what activities are safe for you.  It is up to you to get the results of your procedure. Ask your health care provider, or the department that is doing the procedure, when your results will be ready.  Keep all follow-up visits as told by your health care provider. This is important. Contact a health care provider if: 1. You have any symptoms of allergy to the contrast dye. These include: ? Shortness of breath. ? Rash or hives. ? A racing heartbeat. Summary  A cardiac CT angiogram is a procedure to look at the heart and the area around the heart. It may be done to help find the cause of chest pains or other symptoms of heart disease.  During this procedure, a large X-ray machine, called a CT scanner, takes detailed pictures of the heart and the surrounding area after a contrast dye has been  injected into blood vessels in the area.  Ask your health care provider about changing or stopping your regular medicines before the procedure. This is especially important if you are taking diabetes medicines, blood thinners, or medicines to treat erectile dysfunction.  If you are told, drink enough fluid to keep your urine pale yellow. This will help to flush the contrast dye out of your body. This information is not intended to replace advice given to you by your health care provider. Make sure you discuss any questions you have with your health care provider. Document Revised: 12/21/2018 Document Reviewed: 12/21/2018  Elsevier Patient Education  [employer].

## 2020-02-02 NOTE — Progress Notes (Signed)
Cardiology Office Note:    Date:  02/02/2020   ID:  Luis Peterson, DOB 1955/12/25, MRN [id number]  PCP:  Isaac Bliss, Rayford Halsted, MD  Cardiologist:  Jenean Lindau, MD   Referring MD: Isaac Bliss, [place]*    ASSESSMENT:    1. HYPERCHOLESTEROLEMIA   2. DOE (dyspnea on exertion)   3. Diabetes mellitus due to underlying condition with unspecified complications ([place])    PLAN:    In order of problems listed above:  1. Primary prevention stressed with the patient.  Importance of compliance with diet medication stressed and he vocalized understanding. 2. Mixed dyslipidemia: Diet was emphasized.  Lipids were reviewed from past evaluation.  Weight reduction was stressed and he promises to do better. 3. Dyspnea on exertion: Patient has multiple risk factors for coronary artery disease and the symptoms are concerning.  It bothers him and therefore I want to clarify on his coronary status and discuss CT coronary angiography with FFR and is agreeable. 4. Diabetes mellitus: Diet was emphasized.  Again weight reduction was stressed and he promises to do better. 5. Patient will be seen in follow-up appointment in 3 months or earlier if the patient has any concerns    Medication Adjustments/Labs and Tests Ordered: Current medicines are reviewed at length with the patient today.  Concerns regarding medicines are outlined above.  No orders of the defined types were placed in this encounter.  No orders of the defined types were placed in this encounter.    No chief complaint on file.    History of Present Illness:    Luis Peterson is a 64 y.o. male.  Patient has past medical history of mixed dyslipidemia and diabetes mellitus.  He overall leads a sedentary lifestyle.  He now complains of shortness of breath on exertion.  This is concerning to him.  No orthopnea or PND.  No chest pain.  At the time of my evaluation, the patient is alert awake oriented and in no distress.  Past  Medical History:  Diagnosis Date  . ANXIETY 01/03/2007   Qualifier: Diagnosis of  By: Loanne Drilling MD, Jacelyn Pi   . Anxiety state 01/03/2007   Qualifier: Diagnosis of  By: Loanne Drilling MD, Jacelyn Pi   . Arthralgia 10/30/2009   Qualifier: Diagnosis of  By: Loanne Drilling MD, Jacelyn Pi   . 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 Consuella Lose  . Bilateral shoulder pain 08/13/2016  . Degenerative joint disease of cervical spine   . Depression   . DM (diabetes mellitus), type 2 ([place]) 10/20/2018  . DOE (dyspnea on exertion) 10/29/2017  . DYSPNEA 09/01/2007   Qualifier: Diagnosis of  By: Loanne Drilling MD, Jacelyn Pi   . FOOT PAIN 10/30/2009   Qualifier: Diagnosis of  By: Loanne Drilling MD, Jacelyn Pi   . GERD 06/19/2008   Qualifier: Diagnosis of  By: Loanne Drilling MD, Jacelyn Pi   . HEARING LOSS 06/19/2008   Qualifier: Diagnosis of  By: Loanne Drilling MD, Jacelyn Pi   . HYPERCHOLESTEROLEMIA 09/01/2007   Qualifier: Diagnosis of  By: Loanne Drilling MD, Jacelyn Pi   . Hyperglycemia 09/01/2007   Qualifier: Diagnosis of  By: Loanne Drilling MD, Sean A   . INSOMNIA 06/19/2008   Qualifier: Diagnosis of  By: Loanne Drilling MD, Jacelyn Pi Low back pain 08/31/2013  . NASH (nonalcoholic steatohepatitis) 09/28/2017  . 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 Positive H. pylori test  07/1997  . 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 Rectal bleeding 02/03/2016  . Screening for prostate cancer 09/03/2014  . Weight gain 09/28/2017  . Wellness examination 09/26/2015    Past Surgical History:  Procedure Laterality Date  . COLONOSCOPY    . ELECTROCARDIOGRAM  05/28/2006  . Stress Cardiolite  08/22/2003  . TYMPANOPLASTY  03/1996   Right    Current Medications: Current Meds  Medication Sig  . Accu-Chek FastClix Lancets MISC 1 Stick by Does not apply route 2 (two) times daily.  Marland Kitchen alfuzosin (UROXATRAL) 10 MG 24 hr tablet Take 10 mg by mouth daily.  Marland Kitchen ALPRAZolam (XANAX) 0.5 MG tablet TAKE 1 TABLET(0.5 MG) BY MOUTH  THREE TIMES DAILY AS NEEDED  . atorvastatin (LIPITOR) 20 MG tablet TAKE 1 TABLET BY MOUTH DAILY AT 6 PM  . Blood Glucose Monitoring Suppl (ACCU-CHEK GUIDE ME) w/Device KIT 1 kit by Does not apply route as directed.  . dutasteride (AVODART) 0.5 MG capsule Take 1 capsule (0.5 mg total) by mouth daily.  . eszopiclone (LUNESTA) 1 MG TABS tablet Take 1 tablet (1 mg total) by mouth at bedtime as needed for sleep.  . eszopiclone (LUNESTA) 2 MG TABS tablet Take one tablet at bedtime as needed for sleep  . glucose blood (ACCU-CHEK GUIDE) test strip Use as instructed  . metFORMIN (GLUCOPHAGE) 500 MG tablet TAKE 1 TABLET(500 MG) BY MOUTH TWICE DAILY WITH A MEAL  . omeprazole (PRILOSEC) 20 MG capsule Take 20 mg by mouth as needed.  . sertraline (ZOLOFT) 100 MG tablet TAKE 1 TABLET(100 MG) BY MOUTH DAILY  . tamsulosin (FLOMAX) 0.4 MG CAPS capsule TAKE 2 CAPSULES BY MOUTH DAILY     Allergies:   Simvastatin   Social History   Socioeconomic History  . Marital status: Married    Spouse name: Not on file  . Number of children: 1  . Years of education: Not on file  . Highest education level: Not on file  Occupational History  . Occupation: Secretary/administrator Professor     Employer: [employer]  Tobacco Use  . Smoking status: Former Smoker    Years: 5.00    Types: Cigarettes  . Smokeless tobacco: Never Used  Substance and Sexual Activity  . Alcohol use: Yes    Comment: social  . Drug use: No  . Sexual activity: Not on file  Other Topics Concern  . Not on file  Social History Narrative  . Not on file   Social Determinants of Health   Financial Resource Strain:   . Difficulty of Paying Living Expenses: Not on file  Food Insecurity:   . Worried About Charity fundraiser in the Last Year: Not on file  . Ran Out of Food in the Last Year: Not on file  Transportation Needs:   . Lack of Transportation (Medical): Not on file  . Lack of Transportation (Non-Medical): Not on file  Physical Activity:   .  Days of Exercise per Week: Not on file  . Minutes of Exercise per Session: Not on file  Stress:   . Feeling of Stress : Not on file  Social Connections:   . Frequency of Communication with Friends and Family: Not on file  . Frequency of Social Gatherings with Friends and Family: Not on file  . Attends Religious Services: Not on file  . Active Member of Clubs or Organizations: Not on file  . Attends Archivist  Meetings: Not on file  . Marital Status: Not on file     Family History: The patient's family history includes Colon cancer (age of onset: 21) in his sister; Gastric cancer in his father; Heart disease in his mother; Other in his brother; Stroke in his father.  ROS:   Please see the history of present illness.    All other systems reviewed and are negative.  EKGs/Labs/Other Studies Reviewed:    The following studies were reviewed today: I discussed my findings with the patient at extensive length including stress test report.   Recent Labs: 08/24/2019: ALT 27; BUN 12; Creatinine, Ser 0.77; Hemoglobin 14.9; Platelets 196; Potassium 4.3; Sodium 136; TSH 1.060  Recent Lipid Panel    Component Value Date/Time   CHOL 147 08/24/2019 0932   TRIG 172 (H) 08/24/2019 0932   TRIG 160 (H) 05/21/2006 0911   HDL 56 08/24/2019 0932   CHOLHDL 2.6 08/24/2019 0932   CHOLHDL 2 01/25/2019 0945   VLDL 21.2 01/25/2019 0945   LDLCALC 62 08/24/2019 0932   LDLDIRECT 81.0 10/19/2018 1024    Physical Exam:    VS:  BP 126/78   Pulse 84   Ht _0  (1.778 m)   Wt 199 lb 1.3 oz (90.3 kg)   SpO2 98%   BMI 28.57 kg/m     Wt Readings from Last 3 Encounters:  02/02/20 199 lb 1.3 oz (90.3 kg)  07/27/19 194 lb (88 kg)  01/25/19 188 lb 11.2 oz (85.6 kg)     GEN: Patient is in no acute distress HEENT: Normal NECK: No JVD; No carotid bruits LYMPHATICS: No lymphadenopathy CARDIAC: Hear sounds regular, 2/6 systolic murmur at the apex. RESPIRATORY:  Clear to auscultation without  rales, wheezing or rhonchi  ABDOMEN: Soft, non-tender, non-distended MUSCULOSKELETAL:  No edema; No deformity  SKIN: Warm and dry NEUROLOGIC:  Alert and oriented x 3 PSYCHIATRIC:  Normal affect   Signed, Jenean Lindau, MD  02/02/2020 9:54 AM    [place]

## 2020-02-20 LAB — BASIC METABOLIC PANEL
BUN/Creatinine Ratio: 14 (ref 10–24)
BUN: 11 mg/dL (ref 8–27)
CO2: 26 mmol/L (ref 20–29)
Calcium: 9.6 mg/dL (ref 8.6–10.2)
Chloride: 102 mmol/L (ref 96–106)
Creatinine, Ser: 0.79 mg/dL (ref 0.76–1.27)
GFR calc Af Amer: 110 mL/min/{1.73_m2} (ref >59)
GFR calc non Af Amer: 96 mL/min/{1.73_m2} (ref >59)
Glucose: 130 mg/dL — ABNORMAL HIGH (ref 65–99)
Potassium: 4.1 mmol/L (ref 3.5–5.2)
Sodium: 141 mmol/L (ref 134–144)

## 2020-02-21 ENCOUNTER — Other Ambulatory Visit: Payer: Self-pay | Admitting: Internal Medicine

## 2020-02-21 ENCOUNTER — Ambulatory Visit (INDEPENDENT_AMBULATORY_CARE_PROVIDER_SITE_OTHER): Payer: BC Managed Care – PPO | Admitting: Internal Medicine

## 2020-02-21 ENCOUNTER — Other Ambulatory Visit: Payer: Self-pay

## 2020-02-21 ENCOUNTER — Encounter: Payer: Self-pay | Admitting: Internal Medicine

## 2020-02-21 VITALS — BP 120/78 | HR 76 | Temp 98.9°F | Ht 69.5 in | Wt 196.4 lb

## 2020-02-21 DIAGNOSIS — Z23 Encounter for immunization: Secondary | ICD-10-CM

## 2020-02-21 DIAGNOSIS — E7800 Pure hypercholesterolemia, unspecified: Secondary | ICD-10-CM

## 2020-02-21 DIAGNOSIS — R0600 Dyspnea, unspecified: Secondary | ICD-10-CM

## 2020-02-21 DIAGNOSIS — R0609 Other forms of dyspnea: Secondary | ICD-10-CM

## 2020-02-21 DIAGNOSIS — Z0000 Encounter for general adult medical examination without abnormal findings: Secondary | ICD-10-CM | POA: Diagnosis not present

## 2020-02-21 DIAGNOSIS — E1165 Type 2 diabetes mellitus with hyperglycemia: Secondary | ICD-10-CM

## 2020-02-21 DIAGNOSIS — G4700 Insomnia, unspecified: Secondary | ICD-10-CM

## 2020-02-21 NOTE — Progress Notes (Signed)
Established Patient Office Visit     This visit occurred during the SARS-CoV-2 public health emergency.  Safety protocols were in place, including screening questions prior to the visit, additional usage of staff PPE, and extensive cleaning of exam room while observing appropriate contact time as indicated for disinfecting solutions.    CC/Reason for Visit: Annual preventive exam  HPI: Luis Peterson is a 64 y.o. male who is coming in today for the above mentioned reasons. Past Medical History is significant for:  Depression, anxiety, insomnia, hyperlipidemia. his mood has been stable on Zoloft for years.   He also has well-controlled diabetes.  He is requesting his flu vaccine.  He had a colonoscopy in 2018.  He has been dealing with some dyspnea on exertion, his cardiologist has ordered a coronary CT that is scheduled for next week.  No acute complaints.   Past Medical/Surgical History: Past Medical History:  Diagnosis Date  . ANXIETY 01/03/2007   Qualifier: Diagnosis of  By: Loanne Drilling MD, Jacelyn Pi   . Anxiety state 01/03/2007   Qualifier: Diagnosis of  By: Loanne Drilling MD, Jacelyn Pi   . Arthralgia 10/30/2009   Qualifier: Diagnosis of  By: Loanne Drilling MD, Jacelyn Pi   . 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 Consuella Lose  . Bilateral shoulder pain 08/13/2016  . Degenerative joint disease of cervical spine   . Depression   . DM (diabetes mellitus), type 2 ([place]) 10/20/2018  . DOE (dyspnea on exertion) 10/29/2017  . DYSPNEA 09/01/2007   Qualifier: Diagnosis of  By: Loanne Drilling MD, Jacelyn Pi   . FOOT PAIN 10/30/2009   Qualifier: Diagnosis of  By: Loanne Drilling MD, Jacelyn Pi   . GERD 06/19/2008   Qualifier: Diagnosis of  By: Loanne Drilling MD, Jacelyn Pi   . HEARING LOSS 06/19/2008   Qualifier: Diagnosis of  By: Loanne Drilling MD, Jacelyn Pi   . HYPERCHOLESTEROLEMIA 09/01/2007   Qualifier: Diagnosis of  By: Loanne Drilling MD, Jacelyn Pi   . Hyperglycemia 09/01/2007   Qualifier: Diagnosis of  By: Loanne Drilling MD, Sean A   . INSOMNIA 06/19/2008     Qualifier: Diagnosis of  By: Loanne Drilling MD, Jacelyn Pi Low back pain 08/31/2013  . NASH (nonalcoholic steatohepatitis) 09/28/2017  . 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 Positive H. pylori test 07/1997  . 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 Rectal bleeding 02/03/2016  . Screening for prostate cancer 09/03/2014  . Weight gain 09/28/2017  . Wellness examination 09/26/2015    Past Surgical History:  Procedure Laterality Date  . COLONOSCOPY    . ELECTROCARDIOGRAM  05/28/2006  . Stress Cardiolite  08/22/2003  . TYMPANOPLASTY  03/1996   Right    Social History:  reports that he has quit smoking. His smoking use included cigarettes. He quit after 5.00 years of use. He has never used smokeless tobacco. He reports current alcohol use. He reports that he does not use drugs.  Allergies: Allergies  Allergen Reactions  . Simvastatin     REACTION: Rash    Family History:  Family History  Problem Relation Age of Onset  . Gastric cancer Father        died age 57  . Stroke Father   . Heart disease Mother   . Colon cancer Sister 35  . Other Brother  prostate issues, x 2 brothers     Current Outpatient Medications:  .  Accu-Chek FastClix Lancets MISC, 1 Stick by Does not apply route 2 (two) times daily., Disp: 100 each, Rfl: 12 .  alfuzosin (UROXATRAL) 10 MG 24 hr tablet, Take 10 mg by mouth daily., Disp: , Rfl:  .  ALPRAZolam (XANAX) 0.5 MG tablet, TAKE 1 TABLET(0.5 MG) BY MOUTH THREE TIMES DAILY AS NEEDED, Disp: 90 tablet, Rfl: 0 .  atorvastatin (LIPITOR) 20 MG tablet, TAKE 1 TABLET BY MOUTH DAILY AT 6 PM, Disp: 90 tablet, Rfl: 0 .  Blood Glucose Monitoring Suppl (ACCU-CHEK GUIDE ME) w/Device KIT, 1 kit by Does not apply route as directed., Disp: 1 kit, Rfl: 0 .  dutasteride (AVODART) 0.5 MG capsule, Take 1 capsule (0.5 mg total) by mouth daily., Disp: 90 capsule, Rfl: 1 .  eszopiclone (LUNESTA) 1  MG TABS tablet, Take 1 tablet (1 mg total) by mouth at bedtime as needed for sleep., Disp: 30 tablet, Rfl: 0 .  eszopiclone (LUNESTA) 2 MG TABS tablet, Take one tablet at bedtime as needed for sleep, Disp: 30 tablet, Rfl: 0 .  glucose blood (ACCU-CHEK GUIDE) test strip, Use as instructed, Disp: 100 each, Rfl: 12 .  metFORMIN (GLUCOPHAGE) 500 MG tablet, TAKE 1 TABLET(500 MG) BY MOUTH TWICE DAILY WITH A MEAL, Disp: 180 tablet, Rfl: 1 .  omeprazole (PRILOSEC) 20 MG capsule, Take 20 mg by mouth as needed., Disp: , Rfl:  .  sertraline (ZOLOFT) 100 MG tablet, TAKE 1 TABLET(100 MG) BY MOUTH DAILY, Disp: 90 tablet, Rfl: 1 .  tamsulosin (FLOMAX) 0.4 MG CAPS capsule, TAKE 2 CAPSULES BY MOUTH DAILY, Disp: 180 capsule, Rfl: 1 .  metoprolol tartrate (LOPRESSOR) 100 MG tablet, Take 1 tablet (100 mg total) by mouth once for 1 dose. Take 2 hours prior to your CT if your heart rate is greater than 55, Disp: 1 tablet, Rfl: 0  Review of Systems:  Constitutional: Denies fever, chills, diaphoresis, appetite change and fatigue.  HEENT: Denies photophobia, eye pain, redness, hearing loss, ear pain, congestion, sore throat, rhinorrhea, sneezing, mouth sores, trouble swallowing, neck pain, neck stiffness and tinnitus.   Respiratory: Denies cough, chest tightness,  and wheezing.   Cardiovascular: Denies chest pain, palpitations and leg swelling.  Gastrointestinal: Denies nausea, vomiting, abdominal pain, diarrhea, constipation, blood in stool and abdominal distention.  Genitourinary: Denies dysuria, urgency, frequency, hematuria, flank pain and difficulty urinating.  Endocrine: Denies: hot or cold intolerance, sweats, changes in hair or nails, polyuria, polydipsia. Musculoskeletal: Denies myalgias, back pain, joint swelling, arthralgias and gait problem.  Skin: Denies pallor, rash and wound.  Neurological: Denies dizziness, seizures, syncope, weakness, light-headedness, numbness and headaches.  Hematological: Denies  adenopathy. Easy bruising, personal or family bleeding history  Psychiatric/Behavioral: Denies suicidal ideation, mood changes, confusion, nervousness, sleep disturbance and agitation    Physical Exam: Vitals:   02/21/20 1311  BP: 120/78  Pulse: 76  Temp: 98.9 F (37.2 C)  TempSrc: Oral  SpO2: 96%  Weight: 196 lb 6.4 oz (89.1 kg)  Height: 5' 9.5" (1.765 m)    Body mass index is 28.59 kg/m.   Constitutional: NAD, calm, comfortable Eyes: PERRL, lids and conjunctivae normal ENMT: Mucous membranes are moist. Posterior pharynx clear of any exudate or lesions. Normal dentition. Tympanic membrane is pearly white, no erythema or bulging. Neck: normal, supple, no masses, no thyromegaly Respiratory: clear to auscultation bilaterally, no wheezing, no crackles. Normal respiratory effort. No accessory muscle use.  Cardiovascular: Regular rate and  rhythm, no murmurs / rubs / gallops. No extremity edema. 2+ pedal pulses. No carotid bruits.  Abdomen: no tenderness, no masses palpated. No hepatosplenomegaly. Bowel sounds positive.  Musculoskeletal: no clubbing / cyanosis. No joint deformity upper and lower extremities. Good ROM, no contractures. Normal muscle tone.  Skin: no rashes, lesions, ulcers. No induration Neurologic: CN 2-12 grossly intact. Sensation intact, DTR normal. Strength 5/5 in all 4.  Psychiatric: Normal judgment and insight. Alert and oriented x 3. Normal mood.    Impression and Plan:  Encounter for preventive health examination -He has routine eye and dental care. -Flu vaccine today, otherwise immunizations are up-to-date. -Screening labs today. -Healthy lifestyle discussed in detail. -He had a colonoscopy in 2018 and is a 5-year callback. -PSA was normal in May, he follows with a urologist due to his history of BPH.  Need for influenza vaccination -Flu vaccine administered today.  Type 2 diabetes mellitus with hyperglycemia, without long-term current use of insulin  ([place])  -Has been well controlled with the most recent A1c of 5.5 in September 2020. Lab Results  Component Value Date   HGBA1C 5.5 01/25/2019     DOE (dyspnea on exertion) -Currently followed by cardiology with a coronary CT scan scheduled for next week.  HYPERCHOLESTEROLEMIA  - Plan: Lipid panel -Last LDL was 62 in April.    Patient Instructions  -Nice seeing you today!!  -Lab work today; will notify you once results are available.  -Flu vaccine today.  -Schedule follow up in 6 months.   Preventive Care 49-66 Years Old, Male Preventive care refers to lifestyle choices and visits with your health care provider that can promote health and wellness. This includes:  A yearly physical exam. This is also called an annual well check.  Regular dental and eye exams.  Immunizations.  Screening for certain conditions.  Healthy lifestyle choices, such as eating a healthy diet, getting regular exercise, not using drugs or products that contain nicotine and tobacco, and limiting alcohol use. What can I expect for my preventive care visit? Physical exam Your health care provider will check:  Height and weight. These may be used to calculate body mass index (BMI), which is a measurement that tells if you are at a healthy weight.  Heart rate and blood pressure.  Your skin for abnormal spots. Counseling Your health care provider may ask you questions about:  Alcohol, tobacco, and drug use.  Emotional well-being.  Home and relationship well-being.  Sexual activity.  Eating habits.  Work and work Statistician. What immunizations do I need?  Influenza (flu) vaccine  This is recommended every year. Tetanus, diphtheria, and pertussis (Tdap) vaccine  You may need a Td booster every 10 years. Varicella (chickenpox) vaccine  You may need this vaccine if you have not already been vaccinated. Zoster (shingles) vaccine  You may need this after age 52. Measles, mumps,  and rubella (MMR) vaccine  You may need at least one dose of MMR if you were born in 1957 or later. You may also need a second dose. Pneumococcal conjugate (PCV13) vaccine  You may need this if you have certain conditions and were not previously vaccinated. Pneumococcal polysaccharide (PPSV23) vaccine  You may need one or two doses if you smoke cigarettes or if you have certain conditions. Meningococcal conjugate (MenACWY) vaccine  You may need this if you have certain conditions. Hepatitis A vaccine  You may need this if you have certain conditions or if you travel or work in places where  you may be exposed to hepatitis A. Hepatitis B vaccine  You may need this if you have certain conditions or if you travel or work in places where you may be exposed to hepatitis B. Haemophilus influenzae type b (Hib) vaccine  You may need this if you have certain risk factors. Human papillomavirus (HPV) vaccine  If recommended by your health care provider, you may need three doses over 6 months. You may receive vaccines as individual doses or as more than one vaccine together in one shot (combination vaccines). Talk with your health care provider about the risks and benefits of combination vaccines. What tests do I need? Blood tests  Lipid and cholesterol levels. These may be checked every 5 years, or more frequently if you are over 57 years old.  Hepatitis C test.  Hepatitis B test. Screening  Lung cancer screening. You may have this screening every year starting at age 20 if you have a 30-pack-year history of smoking and currently smoke or have quit within the past 15 years.  Prostate cancer screening. Recommendations will vary depending on your family history and other risks.  Colorectal cancer screening. All adults should have this screening starting at age 28 and continuing until age 43. Your health care provider may recommend screening at age 25 if you are at increased risk. You will  have tests every 1-10 years, depending on your results and the type of screening test.  Diabetes screening. This is done by checking your blood sugar (glucose) after you have not eaten for a while (fasting). You may have this done every 1-3 years.  Sexually transmitted disease (STD) testing. Follow these instructions at home: Eating and drinking  Eat a diet that includes fresh fruits and vegetables, whole grains, lean protein, and low-fat dairy products.  Take vitamin and mineral supplements as recommended by your health care provider.  Do not drink alcohol if your health care provider tells you not to drink.  If you drink alcohol: ? Limit how much you have to 0-2 drinks a day. ? Be aware of how much alcohol is in your drink. In the [place] one drink equals one 12 oz bottle of beer (355 mL), one 5 oz glass of wine (148 mL), or one 1 oz glass of hard liquor (44 mL). Lifestyle  Take daily care of your teeth and gums.  Stay active. Exercise for at least 30 minutes on 5 or more days each week.  Do not use any products that contain nicotine or tobacco, such as cigarettes, e-cigarettes, and chewing tobacco. If you need help quitting, ask your health care provider.  If you are sexually active, practice safe sex. Use a condom or other form of protection to prevent STIs (sexually transmitted infections).  Talk with your health care provider about taking a low-dose aspirin every day starting at age 42. What's next?  Go to your health care provider once a year for a well check visit.  Ask your health care provider how often you should have your eyes and teeth checked.  Stay up to date on all vaccines. This information is not intended to replace advice given to you by your health care provider. Make sure you discuss any questions you have with your health care provider. Document Revised: 04/21/2018 Document Reviewed: 04/21/2018 Elsevier Patient Education  2020 [place], [place] [facility]

## 2020-02-21 NOTE — Patient Instructions (Signed)
-Nice seeing you today!!  -Lab work today; will notify you once results are available.  -Flu vaccine today.  -Schedule follow up in 6 months.   Preventive Care 63-63 Years Old, Male Preventive care refers to lifestyle choices and visits with your health care provider that can promote health and wellness. This includes:  A yearly physical exam. This is also called an annual well check.  Regular dental and eye exams.  Immunizations.  Screening for certain conditions.  Healthy lifestyle choices, such as eating a healthy diet, getting regular exercise, not using drugs or products that contain nicotine and tobacco, and limiting alcohol use. What can I expect for my preventive care visit? Physical exam Your health care provider will check:  Height and weight. These may be used to calculate body mass index (BMI), which is a measurement that tells if you are at a healthy weight.  Heart rate and blood pressure.  Your skin for abnormal spots. Counseling Your health care provider may ask you questions about:  Alcohol, tobacco, and drug use.  Emotional well-being.  Home and relationship well-being.  Sexual activity.  Eating habits.  Work and work Statistician. What immunizations do I need?  Influenza (flu) vaccine  This is recommended every year. Tetanus, diphtheria, and pertussis (Tdap) vaccine  You may need a Td booster every 10 years. Varicella (chickenpox) vaccine  You may need this vaccine if you have not already been vaccinated. Zoster (shingles) vaccine  You may need this after age 63. Measles, mumps, and rubella (MMR) vaccine  You may need at least one dose of MMR if you were born in 1957 or later. You may also need a second dose. Pneumococcal conjugate (PCV13) vaccine  You may need this if you have certain conditions and were not previously vaccinated. Pneumococcal polysaccharide (PPSV23) vaccine  You may need one or two doses if you smoke cigarettes or  if you have certain conditions. Meningococcal conjugate (MenACWY) vaccine  You may need this if you have certain conditions. Hepatitis A vaccine  You may need this if you have certain conditions or if you travel or work in places where you may be exposed to hepatitis A. Hepatitis B vaccine  You may need this if you have certain conditions or if you travel or work in places where you may be exposed to hepatitis B. Haemophilus influenzae type b (Hib) vaccine  You may need this if you have certain risk factors. Human papillomavirus (HPV) vaccine  If recommended by your health care provider, you may need three doses over 6 months. You may receive vaccines as individual doses or as more than one vaccine together in one shot (combination vaccines). Talk with your health care provider about the risks and benefits of combination vaccines. What tests do I need? Blood tests  Lipid and cholesterol levels. These may be checked every 5 years, or more frequently if you are over 63 years old.  Hepatitis C test.  Hepatitis B test. Screening  Lung cancer screening. You may have this screening every year starting at age 63 if you have a 30-pack-year history of smoking and currently smoke or have quit within the past 15 years.  Prostate cancer screening. Recommendations will vary depending on your family history and other risks.  Colorectal cancer screening. All adults should have this screening starting at age 63 and continuing until age 45. Your health care provider may recommend screening at age 63 if you are at increased risk. You will have tests every 1-10  years, depending on your results and the type of screening test.  Diabetes screening. This is done by checking your blood sugar (glucose) after you have not eaten for a while (fasting). You may have this done every 1-3 years.  Sexually transmitted disease (STD) testing. Follow these instructions at home: Eating and drinking  Eat a diet  that includes fresh fruits and vegetables, whole grains, lean protein, and low-fat dairy products.  Take vitamin and mineral supplements as recommended by your health care provider.  Do not drink alcohol if your health care provider tells you not to drink.  If you drink alcohol: ? Limit how much you have to 0-2 drinks a day. ? Be aware of how much alcohol is in your drink. In the [place] one drink equals one 12 oz bottle of beer (355 mL), one 5 oz glass of wine (148 mL), or one 1 oz glass of hard liquor (44 mL). Lifestyle  Take daily care of your teeth and gums.  Stay active. Exercise for at least 30 minutes on 5 or more days each week.  Do not use any products that contain nicotine or tobacco, such as cigarettes, e-cigarettes, and chewing tobacco. If you need help quitting, ask your health care provider.  If you are sexually active, practice safe sex. Use a condom or other form of protection to prevent STIs (sexually transmitted infections).  Talk with your health care provider about taking a low-dose aspirin every day starting at age 82. What's next?  Go to your health care provider once a year for a well check visit.  Ask your health care provider how often you should have your eyes and teeth checked.  Stay up to date on all vaccines. This information is not intended to replace advice given to you by your health care provider. Make sure you discuss any questions you have with your health care provider. Document Revised: 04/21/2018 Document Reviewed: 04/21/2018 Elsevier Patient Education  2020 [employer].

## 2020-02-22 ENCOUNTER — Encounter: Payer: Self-pay | Admitting: Internal Medicine

## 2020-02-22 ENCOUNTER — Other Ambulatory Visit: Payer: Self-pay | Admitting: Internal Medicine

## 2020-02-22 DIAGNOSIS — G4700 Insomnia, unspecified: Secondary | ICD-10-CM

## 2020-02-24 LAB — LIPID PANEL
Cholesterol: 155 mg/dL (ref <200)
HDL: 54 mg/dL (ref >=40)
LDL Cholesterol (Calc): 76 mg/dL (calc)
Non-HDL Cholesterol (Calc): 101 mg/dL (calc) (ref <130)
Total CHOL/HDL Ratio: 2.9 (calc) (ref <5.0)
Triglycerides: 157 mg/dL — ABNORMAL HIGH (ref <150)

## 2020-02-24 LAB — COMPREHENSIVE METABOLIC PANEL
AG Ratio: 1.7 (calc) (ref 1.0–2.5)
ALT: 36 U/L (ref 9–46)
AST: 27 U/L (ref 10–35)
Albumin: 4.8 g/dL (ref 3.6–5.1)
Alkaline phosphatase (APISO): 50 U/L (ref 35–144)
BUN: 12 mg/dL (ref 7–25)
CO2: 27 mmol/L (ref 20–32)
Calcium: 10.2 mg/dL (ref 8.6–10.3)
Chloride: 102 mmol/L (ref 98–110)
Creat: 0.8 mg/dL (ref 0.70–1.25)
Globulin: 2.8 g/dL (calc) (ref 1.9–3.7)
Glucose, Bld: 93 mg/dL (ref 65–99)
Potassium: 4.7 mmol/L (ref 3.5–5.3)
Sodium: 139 mmol/L (ref 135–146)
Total Bilirubin: 0.9 mg/dL (ref 0.2–1.2)
Total Protein: 7.6 g/dL (ref 6.1–8.1)

## 2020-02-24 LAB — CBC WITH DIFFERENTIAL/PLATELET
Absolute Monocytes: 544 cells/uL (ref 200–950)
Basophils Absolute: 20 cells/uL (ref 0–200)
Basophils Relative: 0.3 %
Eosinophils Absolute: 129 cells/uL (ref 15–500)
Eosinophils Relative: 1.9 %
HCT: 43.9 % (ref 38.5–50.0)
Hemoglobin: 15 g/dL (ref 13.2–17.1)
Lymphs Abs: 2584 cells/uL (ref 850–3900)
MCH: 29.6 pg (ref 27.0–33.0)
MCHC: 34.2 g/dL (ref 32.0–36.0)
MCV: 86.8 fL (ref 80.0–100.0)
MPV: 11.6 fL (ref 7.5–12.5)
Monocytes Relative: 8 %
Neutro Abs: 3522 cells/uL (ref 1500–7800)
Neutrophils Relative %: 51.8 %
Platelets: 211 10*3/uL (ref 140–400)
RBC: 5.06 10*6/uL (ref 4.20–5.80)
RDW: 12.3 % (ref 11.0–15.0)
Total Lymphocyte: 38 %
WBC: 6.8 10*3/uL (ref 3.8–10.8)

## 2020-02-24 LAB — HEMOGLOBIN A1C
Hgb A1c MFr Bld: 5.8 % of total Hgb — ABNORMAL HIGH (ref <5.7)
Mean Plasma Glucose: 120 (calc)
eAG (mmol/L): 6.6 (calc)

## 2020-02-24 LAB — TEST AUTHORIZATION

## 2020-02-26 ENCOUNTER — Telehealth (HOSPITAL_COMMUNITY): Payer: Self-pay | Admitting: Emergency Medicine

## 2020-02-26 NOTE — Telephone Encounter (Signed)
Attempted to call patient regarding upcoming cardiac CT appointment. °Left message on voicemail with name and callback number °Kseniya Grunden RN Navigator Cardiac Imaging °[facility] °[phone] Office °[phone] Cell ° °

## 2020-02-27 ENCOUNTER — Encounter (HOSPITAL_COMMUNITY): Payer: Self-pay

## 2020-02-27 ENCOUNTER — Ambulatory Visit (HOSPITAL_COMMUNITY)
Admission: RE | Admit: 2020-02-27 | Discharge: 2020-02-27 | Disposition: A | Discharge status: Home / Self Care | Payer: BC Managed Care – PPO | Source: Ambulatory Visit | Attending: Cardiology | Admitting: Cardiology

## 2020-02-27 DIAGNOSIS — I700 Atherosclerosis of aorta: Secondary | ICD-10-CM

## 2020-02-27 DIAGNOSIS — R0600 Dyspnea, unspecified: Secondary | ICD-10-CM | POA: Diagnosis present

## 2020-02-27 DIAGNOSIS — R0609 Other forms of dyspnea: Secondary | ICD-10-CM

## 2020-02-27 MED ORDER — NITROGLYCERIN 0.4 MG SL SUBL: 0.8000 mg | SUBLINGUAL_TABLET | Freq: Once | SUBLINGUAL | Status: AC

## 2020-02-27 MED ORDER — IOHEXOL 350 MG/ML SOLN
80.0000 mL | Freq: Once | INTRAVENOUS | Status: AC | PRN
  Administered 2020-02-27: 80 mL via INTRAVENOUS

## 2020-02-27 MED ORDER — NITROGLYCERIN 0.4 MG SL SUBL
SUBLINGUAL_TABLET | SUBLINGUAL | Status: AC
  Administered 2020-02-27: 0.8 mg via SUBLINGUAL
  Filled 2020-02-27: qty 2

## 2020-02-29 NOTE — Addendum Note (Signed)
Addended by: Truddie Hidden on: 02/29/2020 10:13 AM   Modules accepted: Orders

## 2020-03-02 ENCOUNTER — Other Ambulatory Visit: Payer: Self-pay | Admitting: Internal Medicine

## 2020-03-19 ENCOUNTER — Other Ambulatory Visit: Payer: Self-pay | Admitting: Internal Medicine

## 2020-03-19 DIAGNOSIS — G4700 Insomnia, unspecified: Secondary | ICD-10-CM

## 2020-04-17 ENCOUNTER — Other Ambulatory Visit: Payer: Self-pay | Admitting: Internal Medicine

## 2020-04-17 DIAGNOSIS — E119 Type 2 diabetes mellitus without complications: Secondary | ICD-10-CM

## 2020-04-24 ENCOUNTER — Other Ambulatory Visit: Payer: Self-pay | Admitting: Internal Medicine

## 2020-04-24 DIAGNOSIS — E7800 Pure hypercholesterolemia, unspecified: Secondary | ICD-10-CM

## 2020-05-20 ENCOUNTER — Other Ambulatory Visit: Payer: Self-pay

## 2020-05-21 ENCOUNTER — Encounter: Payer: Self-pay | Admitting: Gastroenterology

## 2020-05-21 ENCOUNTER — Ambulatory Visit: Payer: BC Managed Care – PPO | Admitting: Gastroenterology

## 2020-05-21 VITALS — BP 110/68 | HR 64 | Ht 69.5 in | Wt 203.0 lb

## 2020-05-21 DIAGNOSIS — K5909 Other constipation: Secondary | ICD-10-CM

## 2020-05-21 DIAGNOSIS — K648 Other hemorrhoids: Secondary | ICD-10-CM | POA: Diagnosis not present

## 2020-05-21 NOTE — Progress Notes (Signed)
[facility] Progress Note  Chief Complaint: Rectal bleeding  Subjective  History: Seen 4 years ago for rectal pain and bleeding.  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  4 mm ascending colon tubular adenoma removed, recall 5 years due to polyp and history of colon cancer in his sister.  Luis Peterson me because about a month ago he had a week of painless rectal bleeding.  It has not occurred since around the time I Peterson him for his colonoscopy.  He has a bowel movement daily but it often takes a while and he needs to strain.  He denies chronic abdominal pain, nausea vomiting or dysphagia  ROS: Cardiovascular:  no chest pain Respiratory: no dyspnea  The patient's Past Medical, Family and Social History were reviewed and are on file in the EMR.  Objective:  Med list reviewed  Current Outpatient Medications:  .  Accu-Chek FastClix Lancets MISC, 1 Stick by Does not apply route 2 (two) times daily., Disp: 100 each, Rfl: 12 .  ALPRAZolam (XANAX) 0.5 MG tablet, TAKE 1 TABLET(0.5 MG) BY MOUTH THREE TIMES DAILY AS NEEDED, Disp: 90 tablet, Rfl: 1 .  atorvastatin (LIPITOR) 20 MG tablet, TAKE 1 TABLET BY MOUTH DAILY AT 6 PM, Disp: 90 tablet, Rfl: 1 .  Blood Glucose Monitoring Suppl (ACCU-CHEK GUIDE ME) w/Device KIT, 1 kit by Does not apply route as directed., Disp: 1 kit, Rfl: 0 .  dutasteride (AVODART) 0.5 MG capsule, Take 1 capsule (0.5 mg total) by mouth daily., Disp: 90 capsule, Rfl: 1 .  eszopiclone (LUNESTA) 1 MG TABS tablet, TAKE 1 TABLET(1 MG) BY MOUTH AT BEDTIME AS NEEDED FOR SLEEP, Disp: 30 tablet, Rfl: 1 .  eszopiclone (LUNESTA) 2 MG TABS tablet, TAKE 1 TABLET BY MOUTH AT BEDTIME AS NEEDED FOR SLEEP, Disp: 30 tablet, Rfl: 2 .  glucose blood (ACCU-CHEK GUIDE) test strip, Use as instructed, Disp: 100 each, Rfl: 12 .  metFORMIN (GLUCOPHAGE) 500 MG tablet, TAKE 1 TABLET(500 MG) BY MOUTH TWICE DAILY WITH A MEAL, Disp: 180 tablet, Rfl: 1 .  omeprazole  (PRILOSEC) 20 MG capsule, Take 20 mg by mouth as needed., Disp: , Rfl:  .  prednisoLONE acetate (PRED FORTE) 1 % ophthalmic suspension, SMARTSIG:In Eye(s), Disp: , Rfl:  .  sertraline (ZOLOFT) 100 MG tablet, TAKE 1 TABLET(100 MG) BY MOUTH DAILY, Disp: 90 tablet, Rfl: 1 .  solifenacin (VESICARE) 5 MG tablet, Take 5 mg by mouth daily., Disp: , Rfl:  .  tamsulosin (FLOMAX) 0.4 MG CAPS capsule, TAKE 2 CAPSULES BY MOUTH DAILY, Disp: 180 capsule, Rfl: 1 .  metoprolol tartrate (LOPRESSOR) 100 MG tablet, Take 1 tablet (100 mg total) by mouth once for 1 dose. Take 2 hours prior to your CT if your heart rate is greater than 55, Disp: 1 tablet, Rfl: 0   Vital signs in last 24 hrs: Vitals:   05/21/20 1046  BP: 110/68  Pulse: 64   Wt Readings from Last 3 Encounters:  05/21/20 203 lb (92.1 kg)  02/21/20 196 lb 6.4 oz (89.1 kg)  02/02/20 199 lb 1.3 oz (90.3 kg)    Physical Exam    Abdomen: soft, no tenderness, with active bowel sounds. No guarding or palpable hepatosplenomegaly. DRE normal Anoscopy with inflamed RA and RP hemorrhoidal columns  Labs:   ___________________________________________ Radiologic studies:   ____________________________________________ Other:   _____________________________________________ Assessment & Plan  Assessment: Encounter Diagnoses  Name Primary?  . Bleeding internal hemorrhoids Yes  . Chronic constipation  Treat chronic constipation, if bleeding still an issue than hemorrhoidal banding.  Banding described for him, diagram shown, brochure given.   Plan: 2 capsules of stool softener every night. If no improvement in a week, add a magnesium tablet every morning. Call as needed for further advice, and if further bleeding then schedule hemorrhoidal banding. No colonoscopy planned at this point  20 minutes were spent on this encounter (including chart review, history/exam, counseling/coordination of care, and documentation) > 50% of that time was  spent on counseling and coordination of care.  Topics discussed included: Constipation, hemorrhoidal bleeding and banding.  [name]

## 2020-05-21 NOTE — Patient Instructions (Signed)
If you are age 64 or older, your body mass index should be between 23-30. Your Body mass index is 29.55 kg/m. If this is out of the aforementioned range listed, please consider follow up with your Primary Care Provider.  If you are age 2 or younger, your body mass index should be between 19-25. Your Body mass index is 29.55 kg/m. If this is out of the aformentioned range listed, please consider follow up with your Primary Care Provider.   Colace / ducosate 100 mg capsules, take 2 every evening, if no improvement after a week add magnesium capsule every morning.   It was a pleasure to see you today!  Dr. Loletha Carrow

## 2020-05-28 ENCOUNTER — Ambulatory Visit: Payer: BC Managed Care – PPO | Admitting: Cardiology

## 2020-05-30 ENCOUNTER — Ambulatory Visit: Payer: BC Managed Care – PPO | Admitting: Family Medicine

## 2020-05-30 ENCOUNTER — Encounter: Payer: Self-pay | Admitting: Family Medicine

## 2020-05-30 ENCOUNTER — Other Ambulatory Visit: Payer: Self-pay

## 2020-05-30 VITALS — BP 116/64 | HR 60 | Temp 98.1°F | Ht 69.5 in | Wt 200.0 lb

## 2020-05-30 DIAGNOSIS — H6690 Otitis media, unspecified, unspecified ear: Secondary | ICD-10-CM

## 2020-05-30 MED ORDER — AMOXICILLIN-POT CLAVULANATE 875-125 MG PO TABS: 1.0000 | ORAL_TABLET | Freq: Two times a day (BID) | ORAL | 0 refills | Status: DC

## 2020-05-30 NOTE — Progress Notes (Signed)
   Subjective:    Patient ID: Luis Peterson, male    DOB: 1956-04-16, 65 y.o.   MRN: [id number]  HPI    Review of Systems     Objective:   Physical Exam        Assessment & Plan:

## 2020-05-30 NOTE — Progress Notes (Signed)
   Subjective:    Patient ID: Luis Peterson, male    DOB: 1955-09-07, 65 y.o.   MRN: [id number]  HPI Here for 4 days of a dry cough and mild ST, now also with one day of left ear pain. No fever or body aches. No NVD. His wife and son have also had a cough. They both tested negative for the Covid-19 virus 2 days ago, but Luis Peterson has not yet been tested. He is drinking fluids.    Review of Systems  Constitutional: Negative.   HENT: Positive for ear pain and sore throat. Negative for congestion, postnasal drip and sinus pain.   Eyes: Negative.   Respiratory: Positive for cough. Negative for shortness of breath and wheezing.        Objective:   Physical Exam Constitutional:      Appearance: Normal appearance. He is not ill-appearing.  HENT:     Right Ear: Tympanic membrane, ear canal and external ear normal.     Left Ear: External ear normal.     Ears:     Comments: Left TM is red and bulging     Nose: Nose normal.     Mouth/Throat:     Pharynx: Oropharynx is clear.  Eyes:     Conjunctiva/sclera: Conjunctivae normal.  Cardiovascular:     Rate and Rhythm: Normal rate and regular rhythm.     Pulses: Normal pulses.     Heart sounds: Normal heart sounds.  Pulmonary:     Effort: Pulmonary effort is normal.     Breath sounds: Normal breath sounds.  Lymphadenopathy:     Cervical: No cervical adenopathy.  Neurological:     Mental Status: He is alert.           Assessment & Plan:  Left otitis media, treat with Augmentin.  Alysia Penna, MD

## 2020-06-05 ENCOUNTER — Other Ambulatory Visit: Payer: Self-pay | Admitting: Internal Medicine

## 2020-06-05 DIAGNOSIS — G4700 Insomnia, unspecified: Secondary | ICD-10-CM

## 2020-06-06 ENCOUNTER — Other Ambulatory Visit: Payer: Self-pay

## 2020-06-06 ENCOUNTER — Encounter: Payer: Self-pay | Admitting: Cardiology

## 2020-06-06 ENCOUNTER — Ambulatory Visit (INDEPENDENT_AMBULATORY_CARE_PROVIDER_SITE_OTHER): Payer: BC Managed Care – PPO | Admitting: Cardiology

## 2020-06-06 VITALS — BP 116/68 | HR 65 | Ht 70.0 in | Wt 199.1 lb

## 2020-06-06 DIAGNOSIS — I2510 Atherosclerotic heart disease of native coronary artery without angina pectoris: Secondary | ICD-10-CM

## 2020-06-06 DIAGNOSIS — E7800 Pure hypercholesterolemia, unspecified: Secondary | ICD-10-CM

## 2020-06-06 DIAGNOSIS — E088 Diabetes mellitus due to underlying condition with unspecified complications: Secondary | ICD-10-CM | POA: Diagnosis not present

## 2020-06-06 HISTORY — DX: Atherosclerotic heart disease of native coronary artery without angina pectoris: I25.10

## 2020-06-06 NOTE — Patient Instructions (Signed)

## 2020-06-06 NOTE — Progress Notes (Signed)
Cardiology Office Note:    Date:  06/06/2020   ID:  FREAD KOTTKE, DOB 08-02-1955, MRN [id number]  PCP:  Isaac Bliss, Rayford Halsted, MD  Cardiologist:  Jenean Lindau, MD   Referring MD: Isaac Bliss, Estel*    ASSESSMENT:    1. HYPERCHOLESTEROLEMIA   2. Diabetes mellitus due to underlying condition with unspecified complications ([place])   3. Coronary artery disease involving native coronary artery of native heart without angina pectoris    PLAN:    In order of problems listed above:  1. Atherosclerotic vascular disease: Secondary prevention stressed with the patient.  Importance of compliance with diet medication stressed any vocalized understanding.  Patient was urged to walk at least 30 to 40 minutes a day 5 days a week and he promises to do so. 2. Essential hypertension: Blood pressure stable and lifestyle modification and diet was emphasized. 3. Diabetes mellitus: Managed by primary care physician.  Diet discussed. 4. Mixed dyslipidemia: Lipids reviewed.  Patient on statin therapy and tolerating well.  Benefits and risks explained again. 5. Patient will be seen in follow-up appointment in 6 months or earlier if the patient has any concerns.     Medication Adjustments/Labs and Tests Ordered: Current medicines are reviewed at length with the patient today.  Concerns regarding medicines are outlined above.  No orders of the defined types were placed in this encounter.  No orders of the defined types were placed in this encounter.    No chief complaint on file.    History of Present Illness:    Luis Peterson is a 65 y.o. male.  Patient has past medical history of atherosclerotic vascular disease diagnosed by increased calcium score on CT scan, essential hypertension and dyslipidemia.  He is a diabetic.  He denies any problems at this time and takes care of activities of daily living.  No chest pain orthopnea or PND.  At the time of my evaluation, the patient is alert  awake oriented and in no distress.  He leads a sedentary lifestyle.  He is trying to exercise some on a regular basis and lose weight.  Past Medical History:  Diagnosis Date  . ANXIETY 01/03/2007   Qualifier: Diagnosis of  By: Loanne Drilling MD, Jacelyn Pi   . Anxiety state 01/03/2007   Qualifier: Diagnosis of  By: Loanne Drilling MD, Jacelyn Pi   . Arthralgia 10/30/2009   Qualifier: Diagnosis of  By: Loanne Drilling MD, Jacelyn Pi   . 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 Consuella Lose  . Bilateral shoulder pain 08/13/2016  . Degenerative joint disease of cervical spine   . Depression   . Diabetes mellitus due to underlying condition with unspecified complications ([place]) 7/82/4235  . DM (diabetes mellitus), type 2 ([place]) 10/20/2018  . DOE (dyspnea on exertion) 10/29/2017  . DYSPNEA 09/01/2007   Qualifier: Diagnosis of  By: Loanne Drilling MD, Jacelyn Pi   . FOOT PAIN 10/30/2009   Qualifier: Diagnosis of  By: Loanne Drilling MD, Jacelyn Pi   . GERD 06/19/2008   Qualifier: Diagnosis of  By: Loanne Drilling MD, Jacelyn Pi   . HEARING LOSS 06/19/2008   Qualifier: Diagnosis of  By: Loanne Drilling MD, Jacelyn Pi   . HYPERCHOLESTEROLEMIA 09/01/2007   Qualifier: Diagnosis of  By: Loanne Drilling MD, Jacelyn Pi   . Hyperglycemia 09/01/2007   Qualifier: Diagnosis of  By: Loanne Drilling MD, Sean A   . INSOMNIA 06/19/2008   Qualifier: Diagnosis of  By: Loanne Drilling MD, Jacelyn Pi  Low back pain 08/31/2013  . NASH (nonalcoholic steatohepatitis) 09/28/2017  . 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 Positive H. pylori test 07/1997  . 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 Rectal bleeding 02/03/2016  . Screening for prostate cancer 09/03/2014  . Weight gain 09/28/2017  . Wellness examination 09/26/2015    Past Surgical History:  Procedure Laterality Date  . COLONOSCOPY    . ELECTROCARDIOGRAM  05/28/2006  . Stress Cardiolite  08/22/2003  . TYMPANOPLASTY  03/1996   Right    Current Medications: No outpatient  medications have been marked as taking for the 06/06/20 encounter (Office Visit) with Estelle Skibicki, Reita Cliche, MD.     Allergies:   Simvastatin   Social History   Socioeconomic History  . Marital status: Married    Spouse name: Not on file  . Number of children: 1  . Years of education: Not on file  . Highest education level: Not on file  Occupational History  . Occupation: Secretary/administrator Professor     Employer: [employer]  Tobacco Use  . Smoking status: Former Smoker    Years: 5.00    Types: Cigarettes  . Smokeless tobacco: Never Used  Substance and Sexual Activity  . Alcohol use: Yes    Comment: social  . Drug use: No  . Sexual activity: Not on file  Other Topics Concern  . Not on file  Social History Narrative  . Not on file   Social Determinants of Health   Financial Resource Strain: Not on file  Food Insecurity: Not on file  Transportation Needs: Not on file  Physical Activity: Not on file  Stress: Not on file  Social Connections: Not on file     Family History: The patient's family history includes Colon cancer (age of onset: 66) in his sister; Gastric cancer in his father; Heart disease in his mother; Other in his brother; Stroke in his father.  ROS:   Please see the history of present illness.    All other systems reviewed and are negative.  EKGs/Labs/Other Studies Reviewed:    The following studies were reviewed today: IMPRESSION: 1. Coronary calcium score of 71. This was 58 percentile for age and sex matched control.  2. Normal coronary origin with right dominance.  3. Calcified plaque, 0-24% in the mid to distal left main, proximal LAD, proximal to mid circumflex.  4. CAD-RADS 1. Minimal non-obstructive CAD (0-24%). Consider non-atherosclerotic causes of symptoms. Consider preventive therapy and risk factor modification.  5.  Mild aortic atherosclerosis.  Candee Furbish, MD [facility]   Electronically Signed   By: Candee Furbish MD   On: 02/28/2020  08:21   Recent Labs: 08/24/2019: TSH 1.060 02/21/2020: ALT 36; BUN 12; Creat 0.80; Hemoglobin 15.0; Platelets 211; Potassium 4.7; Sodium 139  Recent Lipid Panel    Component Value Date/Time   CHOL 155 02/21/2020 1349   CHOL 147 08/24/2019 0932   TRIG 157 (H) 02/21/2020 1349   TRIG 160 (H) 05/21/2006 0911   HDL 54 02/21/2020 1349   HDL 56 08/24/2019 0932   CHOLHDL 2.9 02/21/2020 1349   VLDL 21.2 01/25/2019 0945   LDLCALC 76 02/21/2020 1349   LDLDIRECT 81.0 10/19/2018 1024    Physical Exam:    VS:  There were no vitals taken for this visit.    Wt Readings from Last 3 Encounters:  05/30/20 200 lb (90.7  kg)  05/21/20 203 lb (92.1 kg)  02/21/20 196 lb 6.4 oz (89.1 kg)     GEN: Patient is in no acute distress HEENT: Normal NECK: No JVD; No carotid bruits LYMPHATICS: No lymphadenopathy CARDIAC: Hear sounds regular, 2/6 systolic murmur at the apex. RESPIRATORY:  Clear to auscultation without rales, wheezing or rhonchi  ABDOMEN: Soft, non-tender, non-distended MUSCULOSKELETAL:  No edema; No deformity  SKIN: Warm and dry NEUROLOGIC:  Alert and oriented x 3 PSYCHIATRIC:  Normal affect   Signed, Jenean Lindau, MD  06/06/2020 11:30 AM    [facility]

## 2020-06-24 ENCOUNTER — Other Ambulatory Visit: Payer: Self-pay | Admitting: Internal Medicine

## 2020-06-24 DIAGNOSIS — G4700 Insomnia, unspecified: Secondary | ICD-10-CM

## 2020-07-14 ENCOUNTER — Other Ambulatory Visit: Payer: Self-pay | Admitting: Internal Medicine

## 2020-07-14 DIAGNOSIS — G4700 Insomnia, unspecified: Secondary | ICD-10-CM

## 2020-08-24 ENCOUNTER — Other Ambulatory Visit: Payer: Self-pay | Admitting: Endocrinology

## 2020-08-25 ENCOUNTER — Other Ambulatory Visit: Payer: Self-pay | Admitting: Internal Medicine

## 2020-09-02 ENCOUNTER — Other Ambulatory Visit: Payer: Self-pay | Admitting: Internal Medicine

## 2020-09-02 DIAGNOSIS — G4700 Insomnia, unspecified: Secondary | ICD-10-CM

## 2020-09-12 ENCOUNTER — Other Ambulatory Visit: Payer: Self-pay | Admitting: Internal Medicine

## 2020-09-15 ENCOUNTER — Other Ambulatory Visit: Payer: Self-pay | Admitting: Internal Medicine

## 2020-09-15 DIAGNOSIS — G4700 Insomnia, unspecified: Secondary | ICD-10-CM

## 2020-10-15 ENCOUNTER — Encounter: Payer: Self-pay | Admitting: Internal Medicine

## 2020-10-19 ENCOUNTER — Other Ambulatory Visit: Payer: Self-pay | Admitting: Internal Medicine

## 2020-10-19 DIAGNOSIS — E119 Type 2 diabetes mellitus without complications: Secondary | ICD-10-CM

## 2020-10-22 ENCOUNTER — Other Ambulatory Visit: Payer: Self-pay | Admitting: Internal Medicine

## 2020-10-22 DIAGNOSIS — G4700 Insomnia, unspecified: Secondary | ICD-10-CM

## 2020-10-24 NOTE — Telephone Encounter (Signed)
Last filled 09/17/2020 Last OV 05/30/2020 (acute)  Ok to fill?

## 2020-11-22 ENCOUNTER — Other Ambulatory Visit: Payer: Self-pay | Admitting: Internal Medicine

## 2021-01-07 ENCOUNTER — Other Ambulatory Visit: Payer: Self-pay | Admitting: Internal Medicine

## 2021-01-07 DIAGNOSIS — G4700 Insomnia, unspecified: Secondary | ICD-10-CM

## 2021-01-11 ENCOUNTER — Other Ambulatory Visit: Payer: Self-pay | Admitting: Internal Medicine

## 2021-01-11 DIAGNOSIS — E7800 Pure hypercholesterolemia, unspecified: Secondary | ICD-10-CM

## 2021-02-17 ENCOUNTER — Other Ambulatory Visit: Payer: Self-pay | Admitting: Internal Medicine

## 2021-03-02 ENCOUNTER — Other Ambulatory Visit: Payer: Self-pay | Admitting: Internal Medicine

## 2021-03-02 DIAGNOSIS — G4700 Insomnia, unspecified: Secondary | ICD-10-CM

## 2021-03-03 ENCOUNTER — Other Ambulatory Visit: Payer: Self-pay | Admitting: Internal Medicine

## 2021-03-03 DIAGNOSIS — G4700 Insomnia, unspecified: Secondary | ICD-10-CM

## 2021-03-22 ENCOUNTER — Other Ambulatory Visit: Payer: Self-pay | Admitting: Internal Medicine

## 2021-03-25 ENCOUNTER — Other Ambulatory Visit: Payer: Self-pay

## 2021-03-27 ENCOUNTER — Ambulatory Visit: Payer: BC Managed Care – PPO | Admitting: Cardiology

## 2021-03-27 ENCOUNTER — Other Ambulatory Visit: Payer: Self-pay

## 2021-03-27 ENCOUNTER — Encounter: Payer: Self-pay | Admitting: Cardiology

## 2021-03-27 VITALS — BP 126/74 | HR 58 | Ht 70.0 in | Wt 201.1 lb

## 2021-03-27 DIAGNOSIS — I2510 Atherosclerotic heart disease of native coronary artery without angina pectoris: Secondary | ICD-10-CM | POA: Diagnosis not present

## 2021-03-27 DIAGNOSIS — E088 Diabetes mellitus due to underlying condition with unspecified complications: Secondary | ICD-10-CM | POA: Diagnosis not present

## 2021-03-27 DIAGNOSIS — E7800 Pure hypercholesterolemia, unspecified: Secondary | ICD-10-CM | POA: Diagnosis not present

## 2021-03-27 DIAGNOSIS — Z1329 Encounter for screening for other suspected endocrine disorder: Secondary | ICD-10-CM

## 2021-03-27 NOTE — Patient Instructions (Signed)
Medication Instructions:  Your physician recommends that you continue on your current medications as directed. Please refer to the Current Medication list given to you today.  *If you need a refill on your cardiac medications before your next appointment, please call your pharmacy*   Lab Work: Your physician recommends that you have labs done in the office today. Your test included  basic metabolic panel, complete blood count, TSH, liver function and lipids.  If you have labs (blood work) drawn today and your tests are completely normal, you will receive your results only by: [place] (if you have MyChart) OR A paper copy in the mail If you have any lab test that is abnormal or we need to change your treatment, we will call you to review the results.   Testing/Procedures: None ordered   Follow-Up: At [facility], you and your health needs are our priority.  As part of our continuing mission to provide you with exceptional heart care, we have created designated Provider Care Teams.  These Care Teams include your primary Cardiologist (physician) and Advanced Practice Providers (APPs -  Physician Assistants and Nurse Practitioners) who all work together to provide you with the care you need, when you need it.  We recommend signing up for the patient portal called "MyChart".  Sign up information is provided on this After Visit Summary.  MyChart is used to connect with patients for Virtual Visits (Telemedicine).  Patients are able to view lab/test results, encounter notes, upcoming appointments, etc.  Non-urgent messages can be sent to your provider as well.   To learn more about what you can do with MyChart, go to [email or web address].    Your next appointment:   6 month(s)  The format for your next appointment:   In Person  Provider:   Jyl Heinz, MD   Other Instructions NA

## 2021-03-27 NOTE — Progress Notes (Signed)
Cardiology Office Note:    Date:  03/27/2021   ID:  Luis Peterson, DOB Mar 31, 1956, MRN [id number]  PCP:  Isaac Bliss, Rayford Halsted, MD  Cardiologist:  Jenean Lindau, MD   Referring MD: Isaac Bliss, Estel*    ASSESSMENT:    1. Coronary artery disease involving native coronary artery of native heart without angina pectoris   2. Diabetes mellitus due to underlying condition with unspecified complications ([place])   3. HYPERCHOLESTEROLEMIA    PLAN:    In order of problems listed above:  Coronary artery disease: Secondary prevention stressed with the patient.  Importance of compliance with diet medication stressed any vocalized understanding.  He was advised to walk at least half an hour a day 5 days a week and he promises to do so. Mixed dyslipidemia: Lipids reviewed.  He is fasting and will do blood work today.  Exercise stressed.  Weight reduction stressed and he promises to do better. Diabetes mellitus: Care provider.  Diet emphasized.  Risks of diabetes emphasized. Patient will be seen in follow-up appointment in 6 months or earlier if the patient has any concerns    Medication Adjustments/Labs and Tests Ordered: Current medicines are reviewed at length with the patient today.  Concerns regarding medicines are outlined above.  No orders of the defined types were placed in this encounter.  No orders of the defined types were placed in this encounter.    No chief complaint on file.    History of Present Illness:    Luis Peterson is a 65 y.o. male.  Patient has past medical history of coronary artery disease, mixed dyslipidemia and diabetes mellitus.  He denies any problems at this time and takes care of activities of daily living.  No chest pain orthopnea or PND.  He mentions to me that he walks at least 2 miles a day 2-3 times a week without any problems.  At the time of my evaluation, the patient is alert awake oriented and in no distress.  Past Medical History:   Diagnosis Date   Anxiety state 01/03/2007   Qualifier: Diagnosis of  By: Loanne Drilling MD, Hilliard Clark A    Arthralgia 10/30/2009   Qualifier: Diagnosis of  By: Loanne Drilling MD, Sean A    BEN LOC HYPERPLASIA PROS W/UR OBST & OTH LUTS 06/19/2008   Dr. Consuella Lose   Bilateral shoulder pain 08/13/2016   CAD (coronary artery disease) 06/06/2020   Degenerative joint disease of cervical spine    Depression    Diabetes mellitus due to underlying condition with unspecified complications ([place]) 24/82/5003   DM (diabetes mellitus), type 2 ([place]) 10/20/2018   DOE (dyspnea on exertion) 10/29/2017   DYSPNEA 09/01/2007   Qualifier: Diagnosis of  By: Loanne Drilling MD, Sean A    FOOT PAIN 10/30/2009   Qualifier: Diagnosis of  By: Loanne Drilling MD, Sean A    GERD 06/19/2008   Qualifier: Diagnosis of  By: Loanne Drilling MD, Sean A    HEARING LOSS 06/19/2008   Qualifier: Diagnosis of  By: Loanne Drilling MD, Sean A    HYPERCHOLESTEROLEMIA 09/01/2007   Qualifier: Diagnosis of  By: Loanne Drilling MD, Sean A    Hyperglycemia 09/01/2007   Qualifier: Diagnosis of  By: Loanne Drilling MD, Sean A    INSOMNIA 06/19/2008   Qualifier: Diagnosis of  By: Loanne Drilling MD, Sean A    Low back pain 08/31/2013   NASH (nonalcoholic steatohepatitis) 09/28/2017   OSTEOARTHRITIS, 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   Qualifier: Diagnosis of  By: Loanne Drilling MD, Sean A    Rectal bleeding 02/03/2016   Screening for prostate cancer 09/03/2014   Weight gain 09/28/2017   Wellness examination 09/26/2015    Past Surgical History:  Procedure Laterality Date   COLONOSCOPY     ELECTROCARDIOGRAM  05/28/2006   Stress Cardiolite  08/22/2003   TYMPANOPLASTY  03/1996   Right    Current Medications: Current Meds  Medication Sig   ALPRAZolam (XANAX) 0.5 MG tablet Take 0.5 mg by mouth at bedtime as needed for anxiety.   atorvastatin (LIPITOR) 20 MG tablet TAKE 1 TABLET BY MOUTH DAILY AT 6 PM    chlorhexidine (PERIDEX) 0.12 % solution 1 mL by Mouth Rinse route daily.   dutasteride (AVODART) 0.5 MG capsule Take 1 capsule (0.5 mg total) by mouth daily.   eszopiclone (LUNESTA) 2 MG TABS tablet TAKE 1 TABLET BY MOUTH AT BEDTIME AS NEEDED FOR SLEEP   metFORMIN (GLUCOPHAGE) 500 MG tablet TAKE 1 TABLET(500 MG) BY MOUTH TWICE DAILY WITH A MEAL   omeprazole (PRILOSEC) 20 MG capsule Take 20 mg by mouth as needed for indigestion.   sertraline (ZOLOFT) 100 MG tablet TAKE 1 TABLET(100 MG) BY MOUTH DAILY   tamsulosin (FLOMAX) 0.4 MG CAPS capsule TAKE 2 CAPSULES BY MOUTH DAILY     Allergies:   Simvastatin   Social History   Socioeconomic History   Marital status: Married    Spouse name: Not on file   Number of children: 1   Years of education: Not on file   Highest education level: Not on file  Occupational History   Occupation: Secretary/administrator Professor     Employer: [employer]  Tobacco Use   Smoking status: Former    Years: 5.00    Types: Cigarettes   Smokeless tobacco: Never  Substance and Sexual Activity   Alcohol use: Yes    Comment: social   Drug use: No   Sexual activity: Not on file  Other Topics Concern   Not on file  Social History Narrative   Not on file   Social Determinants of Health   Financial Resource Strain: Not on file  Food Insecurity: Not on file  Transportation Needs: Not on file  Physical Activity: Not on file  Stress: Not on file  Social Connections: Not on file     Family History: The patient's family history includes Colon cancer (age of onset: 55) in his sister; Gastric cancer in his father; Heart disease in his mother; Other in his brother; Stroke in his father.  ROS:   Please see the history of present illness.    All other systems reviewed and are negative.  EKGs/Labs/Other Studies Reviewed:    The following studies were reviewed today: I discussed my findings with the patient at length.   Recent Labs: No results found for requested labs  within last 8760 hours.  Recent Lipid Panel    Component Value Date/Time   CHOL 155 02/21/2020 1349   CHOL 147 08/24/2019 0932   TRIG 157 (H) 02/21/2020 1349   TRIG 160 (H) 05/21/2006 0911   HDL 54 02/21/2020 1349   HDL 56 08/24/2019 0932   CHOLHDL 2.9 02/21/2020 1349   VLDL 21.2 01/25/2019 0945   LDLCALC 76 02/21/2020 1349   LDLDIRECT 81.0 10/19/2018 1024    Physical Exam:    VS:  BP 126/74   Pulse (!) 58   Ht 5'  10" (1.778 m)   Wt 201 lb 1.3 oz (91.2 kg)   SpO2 97%   BMI 28.85 kg/m     Wt Readings from Last 3 Encounters:  03/27/21 201 lb 1.3 oz (91.2 kg)  06/06/20 199 lb 1.9 oz (90.3 kg)  05/30/20 200 lb (90.7 kg)     GEN: Patient is in no acute distress HEENT: Normal NECK: No JVD; No carotid bruits LYMPHATICS: No lymphadenopathy CARDIAC: Hear sounds regular, 2/6 systolic murmur at the apex. RESPIRATORY:  Clear to auscultation without rales, wheezing or rhonchi  ABDOMEN: Soft, non-tender, non-distended MUSCULOSKELETAL:  No edema; No deformity  SKIN: Warm and dry NEUROLOGIC:  Alert and oriented x 3 PSYCHIATRIC:  Normal affect   Signed, Jenean Lindau, MD  03/27/2021 9:52 AM    [place]

## 2021-03-28 ENCOUNTER — Telehealth: Payer: Self-pay

## 2021-03-28 LAB — HEPATIC FUNCTION PANEL
ALT: 37 IU/L (ref 0–44)
AST: 27 IU/L (ref 0–40)
Albumin: 5.1 g/dL — ABNORMAL HIGH (ref 3.8–4.8)
Alkaline Phosphatase: 66 IU/L (ref 44–121)
Bilirubin Total: 0.6 mg/dL (ref 0.0–1.2)
Bilirubin, Direct: 0.15 mg/dL (ref 0.00–0.40)
Total Protein: 7.5 g/dL (ref 6.0–8.5)

## 2021-03-28 LAB — BASIC METABOLIC PANEL
BUN/Creatinine Ratio: 18 (ref 10–24)
BUN: 13 mg/dL (ref 8–27)
CO2: 23 mmol/L (ref 20–29)
Calcium: 10 mg/dL (ref 8.6–10.2)
Chloride: 100 mmol/L (ref 96–106)
Creatinine, Ser: 0.71 mg/dL — ABNORMAL LOW (ref 0.76–1.27)
Glucose: 102 mg/dL — ABNORMAL HIGH (ref 70–99)
Potassium: 4.6 mmol/L (ref 3.5–5.2)
Sodium: 139 mmol/L (ref 134–144)
eGFR: 102 mL/min/{1.73_m2} (ref >59)

## 2021-03-28 LAB — CBC WITH DIFFERENTIAL/PLATELET
Basophils Absolute: 0 10*3/uL (ref 0.0–0.2)
Basos: 0 %
EOS (ABSOLUTE): 0.3 10*3/uL (ref 0.0–0.4)
Eos: 4 %
Hematocrit: 44.6 % (ref 37.5–51.0)
Hemoglobin: 15.1 g/dL (ref 13.0–17.7)
Immature Grans (Abs): 0 10*3/uL (ref 0.0–0.1)
Immature Granulocytes: 0 %
Lymphocytes Absolute: 2.7 10*3/uL (ref 0.7–3.1)
Lymphs: 36 %
MCH: 29 pg (ref 26.6–33.0)
MCHC: 33.9 g/dL (ref 31.5–35.7)
MCV: 86 fL (ref 79–97)
Monocytes Absolute: 0.7 10*3/uL (ref 0.1–0.9)
Monocytes: 9 %
Neutrophils Absolute: 3.8 10*3/uL (ref 1.4–7.0)
Neutrophils: 51 %
Platelets: 185 10*3/uL (ref 150–450)
RBC: 5.21 x10E6/uL (ref 4.14–5.80)
RDW: 12.2 % (ref 11.6–15.4)
WBC: 7.5 10*3/uL (ref 3.4–10.8)

## 2021-03-28 LAB — LIPID PANEL
Chol/HDL Ratio: 2.9 ratio (ref 0.0–5.0)
Cholesterol, Total: 157 mg/dL (ref 100–199)
HDL: 54 mg/dL (ref >39)
LDL Chol Calc (NIH): 73 mg/dL (ref 0–99)
Triglycerides: 177 mg/dL — ABNORMAL HIGH (ref 0–149)
VLDL Cholesterol Cal: 30 mg/dL (ref 5–40)

## 2021-03-28 LAB — TSH: TSH: 0.76 u[IU]/mL (ref 0.450–4.500)

## 2021-03-28 NOTE — Telephone Encounter (Signed)
Tried calling patient. No answer and no voicemail set up for me to leave a message. 

## 2021-03-28 NOTE — Telephone Encounter (Signed)
-----   Message from Jenean Lindau, MD sent at 03/28/2021 10:05 AM EST ----- The results of the study is unremarkable. Please inform patient. I will discuss in detail at next appointment. Cc  primary care/referring physician Jenean Lindau, MD 03/28/2021 10:04 AM

## 2021-04-10 ENCOUNTER — Encounter: Payer: BC Managed Care – PPO | Admitting: Internal Medicine

## 2021-04-17 ENCOUNTER — Encounter: Payer: Self-pay | Admitting: Internal Medicine

## 2021-04-17 ENCOUNTER — Ambulatory Visit (INDEPENDENT_AMBULATORY_CARE_PROVIDER_SITE_OTHER): Payer: BC Managed Care – PPO | Admitting: Internal Medicine

## 2021-04-17 VITALS — BP 120/70 | HR 72 | Temp 97.9°F | Ht 69.0 in | Wt 198.1 lb

## 2021-04-17 DIAGNOSIS — E7800 Pure hypercholesterolemia, unspecified: Secondary | ICD-10-CM

## 2021-04-17 DIAGNOSIS — E1142 Type 2 diabetes mellitus with diabetic polyneuropathy: Secondary | ICD-10-CM | POA: Diagnosis not present

## 2021-04-17 DIAGNOSIS — K219 Gastro-esophageal reflux disease without esophagitis: Secondary | ICD-10-CM

## 2021-04-17 DIAGNOSIS — N401 Enlarged prostate with lower urinary tract symptoms: Secondary | ICD-10-CM

## 2021-04-17 DIAGNOSIS — Z0000 Encounter for general adult medical examination without abnormal findings: Secondary | ICD-10-CM | POA: Diagnosis not present

## 2021-04-17 DIAGNOSIS — G4700 Insomnia, unspecified: Secondary | ICD-10-CM | POA: Diagnosis not present

## 2021-04-17 DIAGNOSIS — R351 Nocturia: Secondary | ICD-10-CM

## 2021-04-17 DIAGNOSIS — F330 Major depressive disorder, recurrent, mild: Secondary | ICD-10-CM

## 2021-04-17 LAB — VITAMIN D 25 HYDROXY (VIT D DEFICIENCY, FRACTURES): VITD: 37.88 ng/mL (ref 30.00–100.00)

## 2021-04-17 LAB — TSH: TSH: 0.92 u[IU]/mL (ref 0.35–5.50)

## 2021-04-17 LAB — HEMOGLOBIN A1C: Hgb A1c MFr Bld: 6.3 % (ref 4.6–6.5)

## 2021-04-17 LAB — VITAMIN B12: Vitamin B-12: 664 pg/mL (ref 211–911)

## 2021-04-17 MED ORDER — METFORMIN HCL 500 MG PO TABS: ORAL_TABLET | ORAL | 1 refills | Status: DC

## 2021-04-17 MED ORDER — ESZOPICLONE 3 MG PO TABS: 3.0000 mg | ORAL_TABLET | Freq: Every day | ORAL | 0 refills | Status: DC

## 2021-04-17 MED ORDER — OMEPRAZOLE 20 MG PO CPDR: 20.0000 mg | DELAYED_RELEASE_CAPSULE | ORAL | 1 refills | Status: DC | PRN

## 2021-04-17 MED ORDER — DUTASTERIDE 0.5 MG PO CAPS: 0.5000 mg | ORAL_CAPSULE | Freq: Every day | ORAL | 1 refills | Status: AC

## 2021-04-17 MED ORDER — ATORVASTATIN CALCIUM 20 MG PO TABS: ORAL_TABLET | ORAL | 1 refills | Status: DC

## 2021-04-17 MED ORDER — SERTRALINE HCL 100 MG PO TABS: 100.0000 mg | ORAL_TABLET | Freq: Every day | ORAL | 1 refills | Status: DC

## 2021-04-17 MED ORDER — ALPRAZOLAM 0.5 MG PO TABS: 0.5000 mg | ORAL_TABLET | Freq: Every evening | ORAL | 2 refills | Status: DC | PRN

## 2021-04-17 MED ORDER — TAMSULOSIN HCL 0.4 MG PO CAPS: 0.8000 mg | ORAL_CAPSULE | Freq: Every day | ORAL | 1 refills | Status: DC

## 2021-04-17 NOTE — Patient Instructions (Signed)
-  Nice seeing you today!!  -Lab work today; will notify you once results are available.  -Please schedule follow up in 6 months.

## 2021-04-17 NOTE — Progress Notes (Signed)
Established Patient Office Visit     This visit occurred during the SARS-CoV-2 public health emergency.  Safety protocols were in place, including screening questions prior to the visit, additional usage of staff PPE, and extensive cleaning of exam room while observing appropriate contact time as indicated for disinfecting solutions.    CC/Reason for Visit: Annual preventive exam  HPI: Luis Peterson is a 65 y.o. male who is coming in today for the above mentioned reasons. Past Medical History is significant for:  Depression, anxiety, insomnia, hyperlipidemia. his mood has been stable on Zoloft for years.   He also has well-controlled diabetes.  Unfortunately, his wife was diagnosed with cancer last week.  He has started to feel some numbness and pinprick sensation of the plantar surface of both feet.  All immunizations are up-to-date, he had a colonoscopy in 2018, he follows with a urologist for his BPH.   Past Medical/Surgical History: Past Medical History:  Diagnosis Date   Anxiety state 01/03/2007   Qualifier: Diagnosis of  By: Loanne Drilling MD, Hilliard Clark A    Arthralgia 10/30/2009   Qualifier: Diagnosis of  By: Loanne Drilling MD, Sean A    BEN LOC HYPERPLASIA PROS W/UR OBST & OTH LUTS 06/19/2008   Dr. Consuella Lose   Bilateral shoulder pain 08/13/2016   CAD (coronary artery disease) 06/06/2020   Degenerative joint disease of cervical spine    Depression    Diabetes mellitus due to underlying condition with unspecified complications ([place]) 99/35/7017   DM (diabetes mellitus), type 2 ([place]) 10/20/2018   DOE (dyspnea on exertion) 10/29/2017   DYSPNEA 09/01/2007   Qualifier: Diagnosis of  By: Loanne Drilling MD, Sean A    FOOT PAIN 10/30/2009   Qualifier: Diagnosis of  By: Loanne Drilling MD, Sean A    GERD 06/19/2008   Qualifier: Diagnosis of  By: Loanne Drilling MD, Sean A    HEARING LOSS 06/19/2008   Qualifier: Diagnosis of  By: Loanne Drilling MD, Sean A    HYPERCHOLESTEROLEMIA 09/01/2007   Qualifier: Diagnosis of  By:  Loanne Drilling MD, Sean A    Hyperglycemia 09/01/2007   Qualifier: Diagnosis of  By: Loanne Drilling MD, Sean A    INSOMNIA 06/19/2008   Qualifier: Diagnosis of  By: Loanne Drilling MD, Sean A    Low back pain 08/31/2013   NASH (nonalcoholic steatohepatitis) 09/28/2017   OSTEOARTHRITIS, 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   Qualifier: Diagnosis of  By: Loanne Drilling MD, Sean A    Rectal bleeding 02/03/2016   Screening for prostate cancer 09/03/2014   Weight gain 09/28/2017   Wellness examination 09/26/2015    Past Surgical History:  Procedure Laterality Date   COLONOSCOPY     ELECTROCARDIOGRAM  05/28/2006   Stress Cardiolite  08/22/2003   TYMPANOPLASTY  03/1996   Right    Social History:  reports that he has quit smoking. His smoking use included cigarettes. He has never used smokeless tobacco. He reports current alcohol use. He reports that he does not use drugs.  Allergies: Allergies  Allergen Reactions   Simvastatin     REACTION: Rash    Family History:  Family History  Problem Relation Age of Onset   Gastric cancer Father        died age 62   Stroke Father    Heart disease Mother    Colon cancer Sister 11   Other Brother  prostate issues, x 2 brothers     Current Outpatient Medications:    chlorhexidine (PERIDEX) 0.12 % solution, 1 mL by Mouth Rinse route daily., Disp: , Rfl:    Eszopiclone 3 MG TABS, Take 1 tablet (3 mg total) by mouth at bedtime. Take immediately before bedtime, Disp: 90 tablet, Rfl: 0   ALPRAZolam (XANAX) 0.5 MG tablet, Take 1 tablet (0.5 mg total) by mouth at bedtime as needed for anxiety., Disp: 45 tablet, Rfl: 2   atorvastatin (LIPITOR) 20 MG tablet, TAKE 1 TABLET BY MOUTH DAILY AT 6 PM, Disp: 90 tablet, Rfl: 1   dutasteride (AVODART) 0.5 MG capsule, Take 1 capsule (0.5 mg total) by mouth daily., Disp: 90 capsule, Rfl: 1   metFORMIN (GLUCOPHAGE) 500 MG tablet, TAKE 1  TABLET(500 MG) BY MOUTH TWICE DAILY WITH A MEAL, Disp: 180 tablet, Rfl: 1   omeprazole (PRILOSEC) 20 MG capsule, Take 1 capsule (20 mg total) by mouth as needed., Disp: 90 capsule, Rfl: 1   sertraline (ZOLOFT) 100 MG tablet, Take 1 tablet (100 mg total) by mouth daily., Disp: 90 tablet, Rfl: 1   tamsulosin (FLOMAX) 0.4 MG CAPS capsule, Take 2 capsules (0.8 mg total) by mouth daily., Disp: 180 capsule, Rfl: 1  Review of Systems:  Constitutional: Denies fever, chills, diaphoresis, appetite change and fatigue.  HEENT: Denies photophobia, eye pain, redness, hearing loss, ear pain, congestion, sore throat, rhinorrhea, sneezing, mouth sores, trouble swallowing, neck pain, neck stiffness and tinnitus.   Respiratory: Denies SOB, DOE, cough, chest tightness,  and wheezing.   Cardiovascular: Denies chest pain, palpitations and leg swelling.  Gastrointestinal: Denies nausea, vomiting, abdominal pain, diarrhea, constipation, blood in stool and abdominal distention.  Genitourinary: Denies dysuria, urgency, frequency, hematuria, flank pain and difficulty urinating.  Endocrine: Denies: hot or cold intolerance, sweats, changes in hair or nails, polyuria, polydipsia. Musculoskeletal: Denies myalgias, back pain, joint swelling, arthralgias and gait problem.  Skin: Denies pallor, rash and wound.  Neurological: Denies dizziness, seizures, syncope, weakness, light-headedness and headaches.  Hematological: Denies adenopathy. Easy bruising, personal or family bleeding history  Psychiatric/Behavioral: Denies suicidal ideation, mood changes, confusion, nervousness, sleep disturbance and agitation    Physical Exam: Vitals:   04/17/21 0942  BP: 120/70  Pulse: 72  Temp: 97.9 F (36.6 C)  TempSrc: Oral  SpO2: 98%  Weight: 198 lb 1.6 oz (89.9 kg)  Height: 5' 9"  (1.753 m)    Body mass index is 29.25 kg/m.   Constitutional: NAD, calm, comfortable Eyes: PERRL, lids and conjunctivae normal ENMT: Mucous  membranes are moist. Posterior pharynx clear of any exudate or lesions. Normal dentition. Tympanic membrane is pearly white, no erythema or bulging. Neck: normal, supple, no masses, no thyromegaly Respiratory: clear to auscultation bilaterally, no wheezing, no crackles. Normal respiratory effort. No accessory muscle use.  Cardiovascular: Regular rate and rhythm, no murmurs / rubs / gallops. No extremity edema. 2+ pedal pulses. No carotid bruits.  Abdomen: no tenderness, no masses palpated. No hepatosplenomegaly. Bowel sounds positive.  Musculoskeletal: no clubbing / cyanosis. No joint deformity upper and lower extremities. Good ROM, no contractures. Normal muscle tone.  Skin: no rashes, lesions, ulcers. No induration Neurologic: CN 2-12 grossly intact. Sensation intact, DTR normal. Strength 5/5 in all 4.  Psychiatric: Normal judgment and insight. Alert and oriented x 3. Normal mood.    [facility] Office Visit from 04/17/2021 in [facility] [place]  PHQ-9 Total Score 2        Impression and Plan:  Encounter for  preventive health examination -Recommend routine eye and dental care. -Immunizations: All immunizations are up-to-date and age-appropriate -Healthy lifestyle discussed in detail. -Labs to be updated today. -Colon cancer screening: 06/2016, 5-year callback -Breast cancer screening: Not applicable -Cervical cancer screening: Not applicable -Lung cancer screening: Not applicable -Prostate cancer screening: Followed by urology, PSA 0.570 in June 9532 -DEXA: Not applicable  HYPERCHOLESTEROLEMIA  - Plan: atorvastatin (LIPITOR) 20 MG tablet  Insomnia, unspecified type  - Plan: ALPRAZolam (XANAX) 0.5 MG tablet, Eszopiclone 3 MG TABS  Type 2 diabetes mellitus with diabetic polyneuropathy, without long-term current use of insulin (HCC)  - Plan: metFORMIN (GLUCOPHAGE) 500 MG tablet, Hemoglobin A1c, TSH, Vitamin B12, VITAMIN D 25 Hydroxy (Vit-D Deficiency, Fractures),  VITAMIN D 25 Hydroxy (Vit-D Deficiency, Fractures), Vitamin B12, TSH, Hemoglobin A1c -Check A1c today, suspect he started to develop diabetic neuropathy but he elects to observe and not treat at this time.  Mild episode of recurrent major depressive disorder ([place])  - Plan: sertraline (ZOLOFT) 100 MG tablet -Mood is stable.  Gastroesophageal reflux disease without esophagitis  - Plan: omeprazole (PRILOSEC) 20 MG capsule -Controlled on daily PPI therapy.  Benign prostatic hyperplasia with nocturia  - Plan: dutasteride (AVODART) 0.5 MG capsule, tamsulosin (FLOMAX) 0.4 MG CAPS capsule -Followed by urology.    Patient Instructions  -Nice seeing you today!!  -Lab work today; will notify you once results are available.  -Please schedule follow up in 6 months.   Lelon Frohlich, MD [facility] at [facility]

## 2021-04-18 ENCOUNTER — Encounter: Payer: Self-pay | Admitting: Internal Medicine

## 2021-04-18 DIAGNOSIS — G4700 Insomnia, unspecified: Secondary | ICD-10-CM

## 2021-04-18 MED ORDER — ALPRAZOLAM 0.5 MG PO TABS: 0.5000 mg | ORAL_TABLET | Freq: Every evening | ORAL | 1 refills | Status: DC | PRN

## 2021-04-19 ENCOUNTER — Other Ambulatory Visit: Payer: Self-pay | Admitting: Internal Medicine

## 2021-04-19 DIAGNOSIS — E1142 Type 2 diabetes mellitus with diabetic polyneuropathy: Secondary | ICD-10-CM

## 2021-04-21 ENCOUNTER — Other Ambulatory Visit: Payer: Self-pay | Admitting: Internal Medicine

## 2021-04-21 DIAGNOSIS — N401 Enlarged prostate with lower urinary tract symptoms: Secondary | ICD-10-CM

## 2021-07-15 ENCOUNTER — Encounter: Payer: Self-pay | Admitting: Gastroenterology

## 2021-08-26 ENCOUNTER — Other Ambulatory Visit: Payer: Self-pay | Admitting: Internal Medicine

## 2021-08-26 DIAGNOSIS — G4700 Insomnia, unspecified: Secondary | ICD-10-CM

## 2021-10-13 ENCOUNTER — Other Ambulatory Visit: Payer: Self-pay | Admitting: Internal Medicine

## 2021-10-13 DIAGNOSIS — G4700 Insomnia, unspecified: Secondary | ICD-10-CM

## 2021-10-13 DIAGNOSIS — E7800 Pure hypercholesterolemia, unspecified: Secondary | ICD-10-CM

## 2021-10-17 ENCOUNTER — Other Ambulatory Visit: Payer: Self-pay | Admitting: Internal Medicine

## 2021-10-17 DIAGNOSIS — F330 Major depressive disorder, recurrent, mild: Secondary | ICD-10-CM

## 2021-10-21 ENCOUNTER — Ambulatory Visit: Payer: BC Managed Care – PPO | Admitting: Internal Medicine

## 2021-10-23 ENCOUNTER — Encounter: Payer: Self-pay | Admitting: Internal Medicine

## 2021-10-23 ENCOUNTER — Ambulatory Visit (INDEPENDENT_AMBULATORY_CARE_PROVIDER_SITE_OTHER): Payer: BC Managed Care – PPO | Admitting: Internal Medicine

## 2021-10-23 VITALS — BP 120/70 | HR 65 | Temp 98.4°F | Wt 182.4 lb

## 2021-10-23 DIAGNOSIS — E7800 Pure hypercholesterolemia, unspecified: Secondary | ICD-10-CM

## 2021-10-23 DIAGNOSIS — K219 Gastro-esophageal reflux disease without esophagitis: Secondary | ICD-10-CM

## 2021-10-23 DIAGNOSIS — E1142 Type 2 diabetes mellitus with diabetic polyneuropathy: Secondary | ICD-10-CM | POA: Diagnosis not present

## 2021-10-23 DIAGNOSIS — R5383 Other fatigue: Secondary | ICD-10-CM | POA: Diagnosis not present

## 2021-10-23 DIAGNOSIS — F330 Major depressive disorder, recurrent, mild: Secondary | ICD-10-CM

## 2021-10-23 DIAGNOSIS — G4700 Insomnia, unspecified: Secondary | ICD-10-CM

## 2021-10-23 LAB — POCT GLYCOSYLATED HEMOGLOBIN (HGB A1C): Hemoglobin A1C: 5.6 % (ref 4.0–5.6)

## 2021-10-23 MED ORDER — ATORVASTATIN CALCIUM 20 MG PO TABS: 20.0000 mg | ORAL_TABLET | Freq: Every day | ORAL | 1 refills | Status: DC

## 2021-10-23 MED ORDER — METFORMIN HCL 500 MG PO TABS: ORAL_TABLET | ORAL | 1 refills | Status: DC

## 2021-10-23 MED ORDER — ESZOPICLONE 3 MG PO TABS: ORAL_TABLET | ORAL | 1 refills | Status: DC

## 2021-10-23 MED ORDER — OMEPRAZOLE 20 MG PO CPDR: 20.0000 mg | DELAYED_RELEASE_CAPSULE | ORAL | 1 refills | Status: DC | PRN

## 2021-10-23 MED ORDER — SERTRALINE HCL 100 MG PO TABS: ORAL_TABLET | ORAL | 1 refills | Status: DC

## 2021-10-23 NOTE — Progress Notes (Signed)
Established Patient Office Visit     CC/Reason for Visit: Follow-up chronic conditions, discuss acute concerns  HPI: Luis Peterson is a 66 y.o. male who is coming in today for the above mentioned reasons. Past Medical History is significant for: Insomnia, depression/anxiety, hyperlipidemia, type 2 diabetes, GERD, BPH.  He has lost a significant amount of weight.  His wife was diagnosed with sarcoma and had a very prolonged surgery and hospital stay and is now back home.  He has been having numbness and tingling of his feet.  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   Past Medical/Surgical History: Past Medical History:  Diagnosis Date   Anxiety state 01/03/2007   Qualifier: Diagnosis of  By: Loanne Drilling MD, Hilliard Clark A    Arthralgia 10/30/2009   Qualifier: Diagnosis of  By: Loanne Drilling MD, Sean A    BEN LOC HYPERPLASIA PROS W/UR OBST & OTH LUTS 06/19/2008   Dr. Consuella Lose   Bilateral shoulder pain 08/13/2016   CAD (coronary artery disease) 06/06/2020   Degenerative joint disease of cervical spine    Depression    Diabetes mellitus due to underlying condition with unspecified complications ([place]) 62/83/1517   DM (diabetes mellitus), type 2 ([place]) 10/20/2018   DOE (dyspnea on exertion) 10/29/2017   DYSPNEA 09/01/2007   Qualifier: Diagnosis of  By: Loanne Drilling MD, Sean A    FOOT PAIN 10/30/2009   Qualifier: Diagnosis of  By: Loanne Drilling MD, Sean A    GERD 06/19/2008   Qualifier: Diagnosis of  By: Loanne Drilling MD, Sean A    HEARING LOSS 06/19/2008   Qualifier: Diagnosis of  By: Loanne Drilling MD, Sean A    HYPERCHOLESTEROLEMIA 09/01/2007   Qualifier: Diagnosis of  By: Loanne Drilling MD, Sean A    Hyperglycemia 09/01/2007   Qualifier: Diagnosis of  By: Loanne Drilling MD, Sean A    INSOMNIA 06/19/2008   Qualifier: Diagnosis of  By: Loanne Drilling MD, Sean A    Low back pain 08/31/2013   NASH (nonalcoholic steatohepatitis) 09/28/2017   OSTEOARTHRITIS, 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   Qualifier: Diagnosis of  By: Loanne Drilling MD, Sean A    Rectal bleeding 02/03/2016   Screening for prostate cancer 09/03/2014   Weight gain 09/28/2017   Wellness examination 09/26/2015    Past Surgical History:  Procedure Laterality Date   COLONOSCOPY     ELECTROCARDIOGRAM  05/28/2006   Stress Cardiolite  08/22/2003   TYMPANOPLASTY  03/1996   Right    Social History:  reports that he has quit smoking. His smoking use included cigarettes. He has never used smokeless tobacco. He reports current alcohol use. He reports that he does not use drugs.  Allergies: Allergies  Allergen Reactions   Simvastatin     REACTION: Rash    Family History:  Family History  Problem Relation Age of Onset   Gastric cancer Father        died age 55   Stroke Father    Heart disease Mother    Colon cancer Sister 65   Other Brother        prostate issues, x 2 brothers     Current Outpatient Medications:    ALPRAZolam (XANAX) 0.5 MG tablet, TAKE 1 TABLET(0.5 MG) BY MOUTH AT BEDTIME AS NEEDED FOR ANXIETY, Disp: 90 tablet, Rfl: 0  chlorhexidine (PERIDEX) 0.12 % solution, 1 mL by Mouth Rinse route daily., Disp: , Rfl:    dutasteride (AVODART) 0.5 MG capsule, Take 1 capsule (0.5 mg total) by mouth daily., Disp: 90 capsule, Rfl: 1   tamsulosin (FLOMAX) 0.4 MG CAPS capsule, Take 2 capsules (0.8 mg total) by mouth daily., Disp: 180 capsule, Rfl: 1   atorvastatin (LIPITOR) 20 MG tablet, Take 1 tablet (20 mg total) by mouth at bedtime., Disp: 90 tablet, Rfl: 1   Eszopiclone 3 MG TABS, TAKE 1 TABLET(3 MG) BY MOUTH AT BEDTIME, Disp: 90 tablet, Rfl: 1   metFORMIN (GLUCOPHAGE) 500 MG tablet, TAKE 1 TABLET(500 MG) BY MOUTH TWICE DAILY WITH A MEAL, Disp: 180 tablet, Rfl: 1   omeprazole (PRILOSEC) 20 MG capsule, Take 1 capsule (20 mg total) by mouth as needed., Disp: 90 capsule, Rfl: 1   sertraline (ZOLOFT)  100 MG tablet, TAKE 1 TABLET(100 MG) BY MOUTH DAILY, Disp: 90 tablet, Rfl: 1  Review of Systems:  Constitutional: Denies fever, chills, diaphoresis, appetite change and fatigue.  HEENT: Denies photophobia, eye pain, redness, hearing loss, ear pain, congestion, sore throat, rhinorrhea, sneezing, mouth sores, trouble swallowing, neck pain, neck stiffness and tinnitus.   Respiratory: Denies SOB, DOE, cough, chest tightness,  and wheezing.   Cardiovascular: Denies chest pain, palpitations and leg swelling.  Gastrointestinal: Denies nausea, vomiting, abdominal pain, diarrhea, constipation, blood in stool and abdominal distention.  Genitourinary: Denies dysuria, urgency, frequency, hematuria, flank pain and difficulty urinating.  Endocrine: Denies: hot or cold intolerance, sweats, changes in hair or nails, polyuria, polydipsia. Musculoskeletal: Denies myalgias, back pain, joint swelling, arthralgias and gait problem.  Skin: Denies pallor, rash and wound.  Neurological: Denies dizziness, seizures, syncope, weakness, light-headedness and headaches.  Hematological: Denies adenopathy. Easy bruising, personal or family bleeding history  Psychiatric/Behavioral: Denies suicidal ideation, mood changes, confusion, nervousness, sleep disturbance and agitation    Physical Exam: Vitals:   10/23/21 1521  BP: 120/70  Pulse: 65  Temp: 98.4 F (36.9 C)  TempSrc: Oral  SpO2: 100%  Weight: 182 lb 6.4 oz (82.7 kg)    Body mass index is 26.94 kg/m.   Constitutional: NAD, calm, comfortable Eyes: PERRL, lids and conjunctivae normal, wears corrective lenses ENMT: Mucous membranes are moist.  Respiratory: clear to auscultation bilaterally, no wheezing, no crackles. Normal respiratory effort. No accessory muscle use.  Cardiovascular: Regular rate and rhythm, no murmurs / rubs / gallops. No extremity edema.  Neurologic: Grossly intact and nonfocal Psychiatric: Normal judgment and insight. Alert and oriented  x 3. Normal mood.    Impression and Plan:  Type 2 diabetes mellitus with diabetic polyneuropathy, without long-term current use of insulin ([place])  - Plan: POCT glycosylated hemoglobin (Hb A1C), metFORMIN (GLUCOPHAGE) 500 MG tablet, Microalbumin/Creatinine Ratio, Urine, Comprehensive metabolic panel, Lipid panel -In office A1c demonstrates excellent control at 5.6.  Check microalbumin.  HYPERCHOLESTEROLEMIA  - Plan: atorvastatin (LIPITOR) 20 MG tablet -Recheck lipids, goal LDL less than 70 given history of diabetes  Insomnia, unspecified type  - Plan: Eszopiclone 3 MG TABS  Gastroesophageal reflux disease without esophagitis  - Plan: omeprazole (PRILOSEC) 20 MG capsule  Mild episode of recurrent major depressive disorder ([place])  - Plan: sertraline (ZOLOFT) 100 MG tablet  Fatigue, unspecified type  - Plan: Vitamin B12    Time spent:32 minutes reviewing chart, interviewing and examining patient and formulating plan of care.   Patient Instructions  -Nice seeing you today!!  -Lab work today; will notify you once results are available.  -  Schedule follow up in 3-4 months.    Lelon Frohlich, MD [facility] at [facility]

## 2021-10-23 NOTE — Patient Instructions (Signed)
-  Nice seeing you today!!  -Lab work today; will notify you once results are available.  -Schedule follow up in 3-4 months.

## 2021-10-24 ENCOUNTER — Ambulatory Visit (AMBULATORY_SURGERY_CENTER): Payer: Self-pay | Admitting: *Deleted

## 2021-10-24 VITALS — Ht 69.0 in | Wt 181.8 lb

## 2021-10-24 DIAGNOSIS — Z86010 Personal history of colonic polyps: Secondary | ICD-10-CM

## 2021-10-24 LAB — COMPREHENSIVE METABOLIC PANEL
ALT: 20 U/L (ref 0–53)
AST: 23 U/L (ref 0–37)
Albumin: 4.7 g/dL (ref 3.5–5.2)
Alkaline Phosphatase: 42 U/L (ref 39–117)
BUN: 12 mg/dL (ref 6–23)
CO2: 26 mEq/L (ref 19–32)
Calcium: 9.9 mg/dL (ref 8.4–10.5)
Chloride: 102 mEq/L (ref 96–112)
Creatinine, Ser: 0.73 mg/dL (ref 0.40–1.50)
GFR: 95.95 mL/min (ref >60.00)
Glucose, Bld: 68 mg/dL — ABNORMAL LOW (ref 70–99)
Potassium: 4 mEq/L (ref 3.5–5.1)
Sodium: 138 mEq/L (ref 135–145)
Total Bilirubin: 0.8 mg/dL (ref 0.2–1.2)
Total Protein: 7.5 g/dL (ref 6.0–8.3)

## 2021-10-24 LAB — VITAMIN B12: Vitamin B-12: 739 pg/mL (ref 211–911)

## 2021-10-24 LAB — MICROALBUMIN / CREATININE URINE RATIO
Creatinine,U: 21.3 mg/dL
Microalb Creat Ratio: 3.3 mg/g (ref 0.0–30.0)
Microalb, Ur: <0.7 mg/dL (ref 0.0–1.9)

## 2021-10-24 LAB — LIPID PANEL
Cholesterol: 144 mg/dL (ref 0–200)
HDL: 52.6 mg/dL (ref >39.00)
LDL Cholesterol: 56 mg/dL (ref 0–99)
NonHDL: 91.64
Total CHOL/HDL Ratio: 3
Triglycerides: 179 mg/dL — ABNORMAL HIGH (ref 0.0–149.0)
VLDL: 35.8 mg/dL (ref 0.0–40.0)

## 2021-10-24 MED ORDER — PEG 3350-KCL-NA BICARB-NACL 420 G PO SOLR: 4000.0000 mL | Freq: Once | ORAL | 0 refills | Status: AC

## 2021-10-24 NOTE — Progress Notes (Signed)
No egg or soy allergy known to patient  No issues known to pt with past sedation with any surgeries or procedures Patient denies ever being told they had issues or difficulty with intubation  No FH of Malignant Hyperthermia Pt is not on diet pills Pt is not on  home 02  Pt is not on blood thinners  Pt denies issues with constipation  No A fib or A flutter   Coupon to pt in PV today , Code to Pharmacy and  NO PA's for preps discussed with pt In PV today  Discussed with pt there will be an out-of-pocket cost for prep and that varies from $0 to 70 +  dollars - pt verbalized understanding  Pt instructed to use Singlecare.com or GoodRx for a price reduction on prep   PV completed over the phone. Pt verified name, DOB, address and insurance during PV today.  Pt mailed instruction packet with copy of consent form to read and not return, and instructions.  Pt encouraged to call with questions or issues.  If pt has My chart, procedure instructions sent via My Chart  Insurance confirmed with pt at [facility] today

## 2021-11-02 ENCOUNTER — Encounter: Payer: Self-pay | Admitting: Certified Registered Nurse Anesthetist

## 2021-11-03 ENCOUNTER — Encounter: Payer: Self-pay | Admitting: Gastroenterology

## 2021-11-14 ENCOUNTER — Encounter: Payer: BC Managed Care – PPO | Admitting: Gastroenterology

## 2021-12-08 ENCOUNTER — Other Ambulatory Visit: Payer: Self-pay | Admitting: Internal Medicine

## 2021-12-08 DIAGNOSIS — G4700 Insomnia, unspecified: Secondary | ICD-10-CM

## 2021-12-09 NOTE — Telephone Encounter (Signed)
Last OV 10/23/21 Next OV: none scheduled

## 2021-12-16 ENCOUNTER — Other Ambulatory Visit: Payer: Self-pay | Admitting: Internal Medicine

## 2021-12-16 DIAGNOSIS — E1142 Type 2 diabetes mellitus with diabetic polyneuropathy: Secondary | ICD-10-CM

## 2021-12-19 ENCOUNTER — Encounter: Payer: BC Managed Care – PPO | Admitting: Gastroenterology

## 2021-12-30 ENCOUNTER — Ambulatory Visit: Payer: BC Managed Care – PPO | Admitting: Internal Medicine

## 2022-01-02 ENCOUNTER — Encounter: Payer: Self-pay | Admitting: Gastroenterology

## 2022-01-02 ENCOUNTER — Ambulatory Visit (AMBULATORY_SURGERY_CENTER): Payer: BC Managed Care – PPO | Admitting: Gastroenterology

## 2022-01-02 VITALS — BP 98/62 | HR 61 | Temp 97.7°F | Resp 14 | Ht 69.0 in | Wt 181.8 lb

## 2022-01-02 DIAGNOSIS — D123 Benign neoplasm of transverse colon: Secondary | ICD-10-CM

## 2022-01-02 DIAGNOSIS — Z800 Family history of malignant neoplasm of digestive organs: Secondary | ICD-10-CM

## 2022-01-02 DIAGNOSIS — Z86010 Personal history of colonic polyps: Secondary | ICD-10-CM | POA: Diagnosis not present

## 2022-01-02 DIAGNOSIS — Z09 Encounter for follow-up examination after completed treatment for conditions other than malignant neoplasm: Secondary | ICD-10-CM

## 2022-01-02 DIAGNOSIS — D122 Benign neoplasm of ascending colon: Secondary | ICD-10-CM

## 2022-01-02 MED ORDER — SODIUM CHLORIDE 0.9 % IV SOLN: 500.0000 mL | Freq: Once | INTRAVENOUS | Status: DC

## 2022-01-02 NOTE — Op Note (Signed)
[place] Patient Name: Luis Peterson Procedure Date: 01/02/2022 3:32 PM MRN: [id number] Endoscopist: Mallie Mussel L. Loletha Carrow , MD Age: 66 Referring MD:  Date of Birth: 1955-11-05 Gender: Male Account #: [id number] Procedure:                Colonoscopy Indications:              Colon cancer screening in patient at increased                            risk: Colorectal cancer in sister, Surveillance:                            Personal history of adenomatous polyps on last                            colonoscopy > 5 years ago                           diminutive TA polyp in Feb 2018 Medicines:                Monitored Anesthesia Care Procedure:                Pre-Anesthesia Assessment:                           - Prior to the procedure, a History and Physical                            was performed, and patient medications and                            allergies were reviewed. The patient's tolerance of                            previous anesthesia was also reviewed. The risks                            and benefits of the procedure and the sedation                            options and risks were discussed with the patient.                            All questions were answered, and informed consent                            was obtained. Prior Anticoagulants: The patient has                            taken no previous anticoagulant or antiplatelet                            agents. ASA Grade Assessment: II - A patient with  mild systemic disease. After reviewing the risks                            and benefits, the patient was deemed in                            satisfactory condition to undergo the procedure.                           After obtaining informed consent, the colonoscope                            was passed under direct vision. Throughout the                            procedure, the patient's blood pressure, pulse, and                             oxygen saturations were monitored continuously. The                            Olympus CF-HQ190L (Serial# [id number]) Colonoscope was                            introduced through the anus and advanced to the the                            cecum, identified by appendiceal orifice and                            ileocecal valve. The colonoscopy was somewhat                            difficult due to multiple diverticula in the colon                            and a redundant colon. Successful completion of the                            procedure was aided by using manual pressure,                            straightening and shortening the scope to obtain                            bowel loop reduction and lavage. The patient                            tolerated the procedure well. The quality of the                            bowel preparation was initially fair,(esp right  colon), then improved to good with lavage. The                            ileocecal valve and appendiceal orifice were not                            photographed due to technical problems. The rectum                            and other areas were photographed. The bowel                            preparation used was GoLYTELY. Scope In: 4:33:30 PM Scope Out: 4:51:42 PM Scope Withdrawal Time: 0 hours 14 minutes 2 seconds  Total Procedure Duration: 0 hours 18 minutes 12 seconds  Findings:                 The perianal and digital rectal examinations were                            normal.                           Multiple diverticula were found in the left colon.                           Repeat examination of right colon under NBI                            performed.                           Two sessile polyps were found in the transverse                            colon and ascending colon. The polyps were                            diminutive in size. These polyps were removed with                             a cold snare. Resection and retrieval were complete.                           Internal hemorrhoids were found.                           The exam was otherwise without abnormality on                            direct and retroflexion views. Complications:            No immediate complications. Estimated Blood Loss:     Estimated blood loss was minimal. Impression:               - Diverticulosis in the left colon.                           -  Two diminutive polyps in the transverse colon and                            in the ascending colon, removed with a cold snare.                            Resected and retrieved.                           - Internal hemorrhoids.                           - The examination was otherwise normal on direct                            and retroflexion views. Recommendation:           - Patient has a contact number available for                            emergencies. The signs and symptoms of potential                            delayed complications were discussed with the                            patient. Return to normal activities tomorrow.                            Written discharge instructions were provided to the                            patient.                           - Resume previous diet.                           - Continue present medications.                           - Await pathology results.                           - Repeat colonoscopy in 5 years for surveillance.                            (polyps and family history). For next exam, use                            same ducolax/split-dose golytely prep. However,                            consume more water and start AM prep dose 2 hours  earlier than usual. Mallie Mussel L. Loletha Carrow, MD 01/02/2022 5:00:32 PM This report has been signed electronically.

## 2022-01-02 NOTE — Patient Instructions (Signed)
Await pathology results.  Handout on polyps, diverticulosis, and hemorrhoids given.  YOU HAD AN ENDOSCOPIC PROCEDURE TODAY AT [facility]:   Refer to the procedure report that was given to you for any specific questions about what was found during the examination.  If the procedure report does not answer your questions, please call your gastroenterologist to clarify.  If you requested that your care partner not be given the details of your procedure findings, then the procedure report has been included in a sealed envelope for you to review at your convenience later.  YOU SHOULD EXPECT: Some feelings of bloating in the abdomen. Passage of more gas than usual.  Walking can help get rid of the air that was put into your GI tract during the procedure and reduce the bloating. If you had a lower endoscopy (such as a colonoscopy or flexible sigmoidoscopy) you may notice spotting of blood in your stool or on the toilet paper. If you underwent a bowel prep for your procedure, you may not have a normal bowel movement for a few days.  Please Note:  You might notice some irritation and congestion in your nose or some drainage.  This is from the oxygen used during your procedure.  There is no need for concern and it should clear up in a day or so.  SYMPTOMS TO REPORT IMMEDIATELY:  Following lower endoscopy (colonoscopy or flexible sigmoidoscopy):  Excessive amounts of blood in the stool  Significant tenderness or worsening of abdominal pains  Swelling of the abdomen that is new, acute  Fever of 100F or higher    For urgent or emergent issues, a gastroenterologist can be reached at any hour by calling [phone]. Do not use MyChart messaging for urgent concerns.    DIET:  We do recommend a small meal at first, but then you may proceed to your regular diet.  Drink plenty of fluids but you should avoid alcoholic beverages for 24 hours.  ACTIVITY:  You should plan to take it easy for  the rest of today and you should NOT DRIVE or use heavy machinery until tomorrow (because of the sedation medicines used during the test).    FOLLOW UP: Our staff will call the number listed on your records the next business day following your procedure.  We will call around 7:15- 8:00 am to check on you and address any questions or concerns that you may have regarding the information given to you following your procedure. If we do not reach you, we will leave a message.  If you develop any symptoms (ie: fever, flu-like symptoms, shortness of breath, cough etc.) before then, please call [phone].  If you test positive for Covid 19 in the 2 weeks post procedure, please call and report this information to [place].    If any biopsies were taken you will be contacted by phone or by letter within the next 1-3 weeks.  Please call us at [phone] if you have not heard about the biopsies in 3 weeks.    SIGNATURES/CONFIDENTIALITY: You and/or your care partner have signed paperwork which will be entered into your electronic medical record.  These signatures attest to the fact that that the information above on your After Visit Summary has been reviewed and is understood.  Full responsibility of the confidentiality of this discharge information lies with you and/or your care-partner.

## 2022-01-02 NOTE — Progress Notes (Unsigned)
History and Physical:  This patient presents for endoscopic testing for: Encounter Diagnoses  Name Primary?   Personal history of colonic polyps Yes   Family history of colon cancer     66 yo man here for surveillance colonoscopy.  Diminutive tubular adenoma Feb 2018, also Hx colon cancer in his sister.  Patient is otherwise without complaints or active issues today.   Past Medical History: Past Medical History:  Diagnosis Date   Anxiety state 01/03/2007   Qualifier: Diagnosis of  By: Loanne Drilling MD, Hilliard Clark A    Arthralgia 10/30/2009   Qualifier: Diagnosis of  By: Loanne Drilling MD, Sean A    BEN LOC HYPERPLASIA PROS W/UR OBST & OTH LUTS 06/19/2008   Dr. Consuella Lose   Bilateral shoulder pain 08/13/2016   CAD (coronary artery disease) 06/06/2020   Degenerative joint disease of cervical spine    Depression    Diabetes mellitus due to underlying condition with unspecified complications ([place]) 91/47/8295   DM (diabetes mellitus), type 2 ([place]) 10/20/2018   DOE (dyspnea on exertion) 10/29/2017   DYSPNEA 09/01/2007   Qualifier: Diagnosis of  By: Loanne Drilling MD, Sean A    FOOT PAIN 10/30/2009   Qualifier: Diagnosis of  By: Loanne Drilling MD, Sean A    GERD 06/19/2008   Qualifier: Diagnosis of  By: Loanne Drilling MD, Sean A    HEARING LOSS 06/19/2008   Qualifier: Diagnosis of  By: Loanne Drilling MD, Sean A    HYPERCHOLESTEROLEMIA 09/01/2007   Qualifier: Diagnosis of  By: Loanne Drilling MD, Sean A    Hyperglycemia 09/01/2007   Qualifier: Diagnosis of  By: Loanne Drilling MD, Sean A    INSOMNIA 06/19/2008   Qualifier: Diagnosis of  By: Loanne Drilling MD, Sean A    Low back pain 08/31/2013   NASH (nonalcoholic steatohepatitis) 09/28/2017   OSTEOARTHRITIS, 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   Qualifier: Diagnosis of  By: Loanne Drilling MD, Sean A    Rectal bleeding 02/03/2016   Screening for prostate cancer 09/03/2014   Weight gain 09/28/2017   Wellness  examination 09/26/2015     Past Surgical History: Past Surgical History:  Procedure Laterality Date   COLONOSCOPY     ELECTROCARDIOGRAM  05/28/2006   POLYPECTOMY     Stress Cardiolite  08/22/2003   TYMPANOPLASTY  03/1996   Right    Allergies: Allergies  Allergen Reactions   Simvastatin     REACTION: Rash    Outpatient Meds: Current Outpatient Medications  Medication Sig Dispense Refill   ALPRAZolam (XANAX) 0.5 MG tablet TAKE 1 TABLET(0.5 MG) BY MOUTH THREE TIMES DAILY AS NEEDED 90 tablet 0   atorvastatin (LIPITOR) 20 MG tablet Take 1 tablet (20 mg total) by mouth at bedtime. 90 tablet 1   chlorhexidine (PERIDEX) 0.12 % solution 1 mL by Mouth Rinse route daily. (Patient not taking: Reported on 10/24/2021)     dutasteride (AVODART) 0.5 MG capsule Take 1 capsule (0.5 mg total) by mouth daily. 90 capsule 1   Eszopiclone 3 MG TABS TAKE 1 TABLET(3 MG) BY MOUTH AT BEDTIME 90 tablet 1   metFORMIN (GLUCOPHAGE) 500 MG tablet TAKE 1 TABLET(500 MG) BY MOUTH TWICE DAILY WITH A MEAL 180 tablet 1   Multiple Vitamins-Minerals (MULTIVITAMIN MEN 50+ PO) Take by mouth daily. TAKE ONE DILY     omeprazole (PRILOSEC) 20 MG capsule Take 1 capsule (20 mg total) by mouth as needed. 90 capsule 1  sertraline (ZOLOFT) 100 MG tablet TAKE 1 TABLET(100 MG) BY MOUTH DAILY 90 tablet 1   tamsulosin (FLOMAX) 0.4 MG CAPS capsule Take 2 capsules (0.8 mg total) by mouth daily. 180 capsule 1   Current Facility-Administered Medications  Medication Dose Route Frequency Provider Last Rate Last Admin   0.9 %  sodium chloride infusion  500 mL Intravenous Once Doran Stabler, MD          ___________________________________________________________________ Objective   Exam:  BP (!) 127/59   Pulse 64   Temp 97.7 F (36.5 C)   Ht 5' 9"  (1.753 m)   Wt 181 lb 12.8 oz (82.5 kg)   SpO2 96%   BMI 26.85 kg/m   CV: RRR without murmur, S1/S2 Resp: clear to auscultation bilaterally, normal RR and effort  noted GI: soft, no tenderness, with active bowel sounds.   Assessment: Encounter Diagnoses  Name Primary?   Personal history of colonic polyps Yes   Family history of colon cancer      Plan: Colonoscopy  The benefits and risks of the planned procedure were described in detail with the patient or (when appropriate) their health care proxy.  Risks were outlined as including, but not limited to, bleeding, infection, perforation, adverse medication reaction leading to cardiac or pulmonary decompensation, pancreatitis (if ERCP).  The limitation of incomplete mucosal visualization was also discussed.  No guarantees or warranties were given.    The patient is appropriate for an endoscopic procedure in the ambulatory setting.   - Wilfrid Lund, MD

## 2022-01-02 NOTE — Progress Notes (Unsigned)
Report given to PACU, vss 

## 2022-01-02 NOTE — Progress Notes (Signed)
Pt's states no medical or surgical changes since previsit or office visit. 

## 2022-01-02 NOTE — Progress Notes (Unsigned)
Called to room to assist during endoscopic procedure.  Patient ID and intended procedure confirmed with present staff. Received instructions for my participation in the procedure from the performing physician.  

## 2022-01-05 ENCOUNTER — Telehealth: Payer: Self-pay

## 2022-01-05 NOTE — Telephone Encounter (Signed)
  Follow up Call-     01/02/2022    3:37 PM  Call back number  Post procedure Call Back phone  # [phone]  Permission to leave phone message Yes     Patient questions:  Do you have a fever, pain , or abdominal swelling? No. Pain Score  0 *  Have you tolerated food without any problems? Yes.    Have you been able to return to your normal activities? Yes.    Do you have any questions about your discharge instructions: Diet   No. Medications  No. Follow up visit  No.  Do you have questions or concerns about your Care? No.  Actions: * If pain score is 4 or above: No action needed, pain <4.

## 2022-01-08 ENCOUNTER — Encounter: Payer: Self-pay | Admitting: Gastroenterology

## 2022-01-08 ENCOUNTER — Telehealth: Payer: Self-pay | Admitting: Gastroenterology

## 2022-01-08 NOTE — Telephone Encounter (Signed)
Lm on vm for patient to return call 

## 2022-01-08 NOTE — Telephone Encounter (Signed)
Patient called seeking advise states he recently had a colonoscopy done and he is still experiencing some rectal bleeding.

## 2022-01-13 NOTE — Telephone Encounter (Signed)
Left message on vm for patient to return call if he still had concerns.

## 2022-01-14 NOTE — Telephone Encounter (Signed)
No return call received. Will await further communication from patient.

## 2022-02-26 ENCOUNTER — Other Ambulatory Visit: Payer: Self-pay | Admitting: Internal Medicine

## 2022-02-26 DIAGNOSIS — G4700 Insomnia, unspecified: Secondary | ICD-10-CM

## 2022-03-01 ENCOUNTER — Other Ambulatory Visit: Payer: Self-pay | Admitting: Internal Medicine

## 2022-03-01 DIAGNOSIS — G4700 Insomnia, unspecified: Secondary | ICD-10-CM

## 2022-03-04 ENCOUNTER — Ambulatory Visit (INDEPENDENT_AMBULATORY_CARE_PROVIDER_SITE_OTHER): Payer: BC Managed Care – PPO | Admitting: Family Medicine

## 2022-03-04 ENCOUNTER — Telehealth: Payer: Self-pay | Admitting: Cardiology

## 2022-03-04 VITALS — BP 100/60 | HR 60 | Temp 97.4°F | Ht 69.0 in | Wt 180.9 lb

## 2022-03-04 DIAGNOSIS — R0789 Other chest pain: Secondary | ICD-10-CM | POA: Diagnosis not present

## 2022-03-04 MED ORDER — MELOXICAM 15 MG PO TABS: 15.0000 mg | ORAL_TABLET | Freq: Every day | ORAL | 0 refills | Status: DC

## 2022-03-04 NOTE — Patient Instructions (Signed)
Take the Meloxicam with food  Follow up with Dr Jerilee Hoh if not improving over next couple of weeks.

## 2022-03-04 NOTE — Progress Notes (Unsigned)
Established Patient Office Visit  Subjective   Patient ID: Luis Peterson, male    DOB: 1955/09/09  Age: 66 y.o. MRN: [id number]  Chief Complaint  Patient presents with   Chest Pain    Patient complains of chest pain, Patient reports he hit door, x2 weeks     HPI  {History (Optional):23778} Patient is seen with anterior chest pain.  He states 2 weeks ago he ran into a door and struck right chest wall region.  There is no visible bruising at the time.  He had some right-sided soreness initially and now somewhat more generalized costochondral area bilaterally.  Some soreness to touch and mild soreness with deep breathing.  Denies any recent cough.  No fever.  No exertional chest pain.  No nausea or vomiting.  No recent diaphoresis.  Pain tends to be worse with deep breathing but also sometimes lying supine.  Patient is a non-smoker.  No known cardiac history.  He has history of coronary calcium score 71 and had CT morphology studies back in 2021 which showed 0 to 24% plaque LAD proximally and also proximal to mid circumflex.  He is on Lipitor.  No family history of premature CAD.  He has type 2 diabetes well controlled with recent A1c 5.6%.  Past Medical History:  Diagnosis Date   Anxiety state 01/03/2007   Qualifier: Diagnosis of  By: Loanne Drilling MD, Hilliard Clark A    Arthralgia 10/30/2009   Qualifier: Diagnosis of  By: Loanne Drilling MD, Sean A    BEN LOC HYPERPLASIA PROS W/UR OBST & OTH LUTS 06/19/2008   Dr. Consuella Lose   Bilateral shoulder pain 08/13/2016   CAD (coronary artery disease) 06/06/2020   Degenerative joint disease of cervical spine    Depression    Diabetes mellitus due to underlying condition with unspecified complications ([place]) 67/34/1937   DM (diabetes mellitus), type 2 ([place]) 10/20/2018   DOE (dyspnea on exertion) 10/29/2017   DYSPNEA 09/01/2007   Qualifier: Diagnosis of  By: Loanne Drilling MD, Sean A    FOOT PAIN 10/30/2009   Qualifier: Diagnosis of  By: Loanne Drilling MD, Sean A    GERD  06/19/2008   Qualifier: Diagnosis of  By: Loanne Drilling MD, Sean A    HEARING LOSS 06/19/2008   Qualifier: Diagnosis of  By: Loanne Drilling MD, Sean A    HYPERCHOLESTEROLEMIA 09/01/2007   Qualifier: Diagnosis of  By: Loanne Drilling MD, Sean A    Hyperglycemia 09/01/2007   Qualifier: Diagnosis of  By: Loanne Drilling MD, Sean A    INSOMNIA 06/19/2008   Qualifier: Diagnosis of  By: Loanne Drilling MD, Sean A    Low back pain 08/31/2013   NASH (nonalcoholic steatohepatitis) 09/28/2017   OSTEOARTHRITIS, 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 His smoking use included cigarettes. He has never been exposed to tobacco smoke. He has never used smokeless tobacco. He reports current alcohol use. He reports that he does not use drugs.  family history includes Colon cancer (age of onset: 61) in his sister; Gastric cancer in his father; Heart disease in his mother; Other in his brother; Stomach cancer in his mother; Stroke in his father. Allergies  Allergen Reactions   Simvastatin     REACTION: Rash    Review of Systems  Constitutional:  Negative for chills and fever.  Respiratory:  Negative for cough, sputum production, shortness of breath and wheezing.   Cardiovascular:  Positive for chest pain. Negative for palpitations, leg swelling and PND.  Skin:  Negative for rash.      Objective:     BP 100/60 (BP Location: Left Arm, Patient Position: Sitting, Cuff Size: Normal)   Pulse 60   Temp (!) 97.4 F (36.3 C) (Oral)   Ht 5' 9"  (1.753  m)   Wt 180 lb 14.4 oz (82.1 kg)   SpO2 98%   BMI 26.71 kg/m  BP Readings from Last 3 Encounters:  03/04/22 100/60  01/02/22 98/62  10/23/21 120/70   Wt Readings from Last 3 Encounters:  03/04/22 180 lb 14.4 oz (82.1 kg)  01/02/22 181 lb 12.8 oz (82.5 kg)  10/24/21 181 lb 12.8 oz (82.5 kg)      Physical Exam Vitals reviewed.  Constitutional:      Appearance: He is well-developed.  Cardiovascular:     Rate and Rhythm: Normal rate and regular rhythm.     Heart sounds: No murmur heard. Pulmonary:     Comments: Chest is clear throughout. Musculoskeletal:     Comments: Has some mild tenderness to palpation lower costochondral junction bilaterally No visible chest wall bruising  Neurological:     Mental Status: He is alert.      No results found for any visits on 03/04/22.  {Labs (Optional):23779}  The 10-year ASCVD risk score (Arnett DK, et al., 2019) is: 12.2%    Assessment & Plan:   Problem List Items Addressed This Visit   None Visit Diagnoses     Atypical chest pain    -  Primary   Relevant Orders   EKG 12-Lead (Completed)     -EKG shows sinus rhythm with no acute ST-T changes. -Patient has some mild tenderness to palpation lower costochondral junction bilaterally.  Suspect related to his recent injury. -Wrote for limited short-term only use of meloxicam 15 mg once daily. -Follow-up with primary if not improving over the next couple weeks  No follow-ups on file.    Carolann Littler, MD

## 2022-03-04 NOTE — Telephone Encounter (Signed)
Pt c/o of Chest Pain: STAT if CP now or developed within 24 hours  1. Are you having CP right now?  Yes   2. Are you experiencing any other symptoms (ex. SOB, nausea, vomiting, sweating)?  No   3. How long have you been experiencing CP?  Past couple of weeks   4. Is your CP continuous or coming and going?  Continuous, but intensity increases  5. Have you taken Nitroglycerin?  No, patient states he hasn't taken anything ?

## 2022-03-04 NOTE — Telephone Encounter (Signed)
Pt called [facility] on [address] concerned about chest pain.    Pt stated he ran into a door a week or so ago, striking the right side of his chest.  The chest pain is right sided, and radiates to diaphragm and into left chest.    Pt has not taken any OTC pain meds like Ibuprofen.  Pt also complains of GERD, but takes Omeprazole daily.    Pt denies any heart related symptoms when asked.    Pt has seen Dr Sunny Schlein Revankar in the past.  Pt told he can call [facility] anytime with questions, but referred to his PCP Dr. Isaac Bliss for follow up care today.  Pt understood advisement.

## 2022-03-11 ENCOUNTER — Encounter: Payer: Self-pay | Admitting: Gastroenterology

## 2022-03-11 ENCOUNTER — Ambulatory Visit: Payer: BC Managed Care – PPO | Admitting: Gastroenterology

## 2022-03-11 VITALS — BP 104/60 | HR 88 | Ht 68.25 in | Wt 178.2 lb

## 2022-03-11 DIAGNOSIS — K648 Other hemorrhoids: Secondary | ICD-10-CM | POA: Diagnosis not present

## 2022-03-11 NOTE — Progress Notes (Signed)
     [facility] Progress Note  Chief Complaint: Symptomatic internal hemorrhoids  Subjective  History:  Luis Peterson is here to see me for symptomatic internal hemorrhoids He has had intermittent feelings of protrusion swelling and bleeding that has become increasingly aggravating.  Thus, he will discuss banding.  We had had an initial discussion about after his colonoscopy, given the pressure, and today I went through additional printed information describing the procedure and the anatomy.  He was agreeable and wished to have his first session today.  PROCEDURE NOTE: The patient presents with symptomatic grade 2  hemorrhoids, requesting rubber band ligation of his/her hemorrhoidal disease.  All risks, benefits and alternative forms of therapy were described and informed consent was obtained.  DRE revealed: nml  Anoscopy: enlarged posterior HR columns, with the RA more toward 9 oclock   The anorectum was pre-medicated with 0.125% NTG and lubricant. The decision was made to band the RP internal hemorrhoids, and the [place] was used to perform band ligation without complication.  Digital anorectal examination was then performed to assure proper positioning of the band, and to adjust the banded tissue as required.  The patient was discharged home without pain or other issues.  Dietary and behavioral recommendations were given and along with follow-up instructions.     The following adjunctive treatments were recommended:  none  The patient will return 2-3 weeks  for  follow-up and possible additional banding as required. No complications were encountered and the patient tolerated the procedure well.   _____________________________________________ Assessment & Plan  Assessment: Encounter Diagnosis  Name Primary?   Bleeding internal hemorrhoids Yes    S/p first session banding today   Luis Peterson

## 2022-03-11 NOTE — Patient Instructions (Signed)
_______________________________________________________  If you are age 65 or older, your body mass index should be between 23-30. Your Body mass index is 26.9 kg/m. If this is out of the aforementioned range listed, please consider follow up with your Primary Care Provider.  If you are age 67 or younger, your body mass index should be between 19-25. Your Body mass index is 26.9 kg/m. If this is out of the aformentioned range listed, please consider follow up with your Primary Care Provider.   ________________________________________________________  The [facility] providers would like to encourage you to use [facility] to communicate with providers for non-urgent requests or questions.  Due to long hold times on the telephone, sending your provider a message by [facility] may be a faster and more efficient way to get a response.  Please allow 48 business hours for a response.  Please remember that this is for non-urgent requests.  _______________________________________________________  [facility] PROCEDURE    FOLLOW-UP CARE   The procedure you have had should have been relatively painless since the banding of the area involved does not have nerve endings and there is no pain sensation.  The rubber band cuts off the blood supply to the hemorrhoid and the band may fall off as soon as 48 hours after the banding (the band may occasionally be seen in the toilet bowl following a bowel movement). You may notice a temporary feeling of fullness in the rectum which should respond adequately to plain Tylenol or Motrin.  Following the banding, avoid strenuous exercise that evening and resume full activity the next day.  A sitz bath (soaking in a warm tub) or bidet is soothing, and can be useful for cleansing the area after bowel movements.     To avoid constipation, take two tablespoons of natural wheat bran, natural oat bran, flax, Benefiber or any over the counter fiber supplement and increase  your water intake to 7-8 glasses daily.    Unless you have been prescribed anorectal medication, do not put anything inside your rectum for two weeks: No suppositories, enemas, fingers, etc.  Occasionally, you may have more bleeding than usual after the banding procedure.  This is often from the untreated hemorrhoids rather than the treated one.  Don't be concerned if there is a tablespoon or so of blood.  If there is more blood than this, lie flat with your bottom higher than your head and apply an ice pack to the area. If the bleeding does not stop within a half an hour or if you feel faint, call our office at [phone] or go to the emergency room.  Problems are not common; however, if there is a substantial amount of bleeding, severe pain, chills, fever or difficulty passing urine (very rare) or other problems, you should call us at [phone] or report to the nearest emergency room.  Do not stay seated continuously for more than 2-3 hours for a day or two after the procedure.  Tighten your buttock muscles 10-15 times every two hours and take 10-15 deep breaths every 1-2 hours.  Do not spend more than a few minutes on the toilet if you cannot empty your bowel; instead re-visit the toilet at a later time.    It was a pleasure to see you today!  Thank you for trusting me with your gastrointestinal care!

## 2022-03-24 ENCOUNTER — Encounter: Payer: Self-pay | Admitting: Internal Medicine

## 2022-03-25 ENCOUNTER — Ambulatory Visit: Payer: BC Managed Care – PPO | Admitting: Gastroenterology

## 2022-03-25 ENCOUNTER — Encounter: Payer: Self-pay | Admitting: Gastroenterology

## 2022-03-25 VITALS — BP 118/64 | HR 82 | Ht 68.25 in | Wt 177.5 lb

## 2022-03-25 DIAGNOSIS — K648 Other hemorrhoids: Secondary | ICD-10-CM | POA: Diagnosis not present

## 2022-03-25 NOTE — Progress Notes (Signed)
PROCEDURE NOTE: The patient presents with symptomatic grade 2  hemorrhoids, requesting rubber band ligation of his/her hemorrhoidal disease.  All risks, benefits and alternative forms of therapy were described and informed consent was obtained.  DRE revealed: nml (No bleeding since first banding)  The anorectum was pre-medicated with 0.125% NTG and lubricant. The decision was made to band the RA internal hemorrhoids, and the [place] was used to perform band ligation without complication.  Digital anorectal examination was then performed to assure proper positioning of the band, and to adjust the banded tissue as required.  The patient was discharged home without pain or other issues.  Dietary and behavioral recommendations were given and along with follow-up instructions.     The following adjunctive treatments were recommended:  none  The patient will return prn  for  follow-up and possible additional banding as required. No complications were encountered and the patient tolerated the procedure well.  Wilfrid Lund, MD

## 2022-03-25 NOTE — Patient Instructions (Signed)
_______________________________________________________  If you are age 65 or older, your body mass index should be between 23-30. Your Body mass index is 26.79 kg/m. If this is out of the aforementioned range listed, please consider follow up with your Primary Care Provider.  If you are age 19 or younger, your body mass index should be between 19-25. Your Body mass index is 26.79 kg/m. If this is out of the aformentioned range listed, please consider follow up with your Primary Care Provider.   ________________________________________________________  The [facility] providers would like to encourage you to use [facility] to communicate with providers for non-urgent requests or questions.  Due to long hold times on the telephone, sending your provider a message by [facility] may be a faster and more efficient way to get a response.  Please allow 48 business hours for a response.  Please remember that this is for non-urgent requests.  _______________________________________________________  Luis Peterson PROCEDURE    FOLLOW-UP CARE   The procedure you have had should have been relatively painless since the banding of the area involved does not have nerve endings and there is no pain sensation.  The rubber band cuts off the blood supply to the hemorrhoid and the band may fall off as soon as 48 hours after the banding (the band may occasionally be seen in the toilet bowl following a bowel movement). You may notice a temporary feeling of fullness in the rectum which should respond adequately to plain Tylenol or Motrin.  Following the banding, avoid strenuous exercise that evening and resume full activity the next day.  A sitz bath (soaking in a warm tub) or bidet is soothing, and can be useful for cleansing the area after bowel movements.     To avoid constipation, take two tablespoons of natural wheat bran, natural oat bran, flax, Benefiber or any over the counter fiber supplement and increase  your water intake to 7-8 glasses daily.    Unless you have been prescribed anorectal medication, do not put anything inside your rectum for two weeks: No suppositories, enemas, fingers, etc.  Occasionally, you may have more bleeding than usual after the banding procedure.  This is often from the untreated hemorrhoids rather than the treated one.  Don't be concerned if there is a tablespoon or so of blood.  If there is more blood than this, lie flat with your bottom higher than your head and apply an ice pack to the area. If the bleeding does not stop within a half an hour or if you feel faint, call our office at [phone] or go to the emergency room.  Problems are not common; however, if there is a substantial amount of bleeding, severe pain, chills, fever or difficulty passing urine (very rare) or other problems, you should call us at [phone] or report to the nearest emergency room.  Do not stay seated continuously for more than 2-3 hours for a day or two after the procedure.  Tighten your buttock muscles 10-15 times every two hours and take 10-15 deep breaths every 1-2 hours.  Do not spend more than a few minutes on the toilet if you cannot empty your bowel; instead re-visit the toilet at a later time.   It was a pleasure to see you today!  Thank you for trusting me with your gastrointestinal care!

## 2022-03-26 ENCOUNTER — Ambulatory Visit (INDEPENDENT_AMBULATORY_CARE_PROVIDER_SITE_OTHER): Payer: BC Managed Care – PPO | Admitting: Internal Medicine

## 2022-03-26 ENCOUNTER — Encounter: Payer: Self-pay | Admitting: Internal Medicine

## 2022-03-26 DIAGNOSIS — G4700 Insomnia, unspecified: Secondary | ICD-10-CM | POA: Diagnosis not present

## 2022-03-26 DIAGNOSIS — E1142 Type 2 diabetes mellitus with diabetic polyneuropathy: Secondary | ICD-10-CM

## 2022-03-26 LAB — POCT GLYCOSYLATED HEMOGLOBIN (HGB A1C): Hemoglobin A1C: 5.6 % (ref 4.0–5.6)

## 2022-03-26 MED ORDER — ALPRAZOLAM 0.5 MG PO TABS: ORAL_TABLET | ORAL | 1 refills | Status: DC

## 2022-03-26 MED ORDER — ESZOPICLONE 3 MG PO TABS: ORAL_TABLET | ORAL | 1 refills | Status: DC

## 2022-03-26 MED ORDER — GABAPENTIN 100 MG PO CAPS: 100.0000 mg | ORAL_CAPSULE | Freq: Three times a day (TID) | ORAL | 1 refills | Status: DC

## 2022-03-26 MED ORDER — METFORMIN HCL 500 MG PO TABS: 500.0000 mg | ORAL_TABLET | Freq: Two times a day (BID) | ORAL | 1 refills | Status: DC

## 2022-03-26 NOTE — Progress Notes (Signed)
Established Patient Office Visit     CC/Reason for Visit: Discuss foot pain  HPI: Luis Peterson is a 66 y.o. male who is coming in today for the above mentioned reasons. Past Medical History is significant for: Diabetes that has been well controlled.  He has been complaining of foot pain like pins-and-needles are walking on broken glass.  We have been aware of this.  He states that lately it has been getting worse.  He is wanting to consider treatment.   Past Medical/Surgical History: Past Medical History:  Diagnosis Date   Anxiety state 01/03/2007   Qualifier: Diagnosis of  By: Loanne Drilling MD, Hilliard Clark A    Arthralgia 10/30/2009   Qualifier: Diagnosis of  By: Loanne Drilling MD, Sean A    BEN LOC HYPERPLASIA PROS W/UR OBST & OTH LUTS 06/19/2008   Dr. Consuella Lose   Bilateral shoulder pain 08/13/2016   CAD (coronary artery disease) 06/06/2020   Degenerative joint disease of cervical spine    Depression    Diabetes mellitus due to underlying condition with unspecified complications ([place]) 24/40/1027   DM (diabetes mellitus), type 2 ([place]) 10/20/2018   DOE (dyspnea on exertion) 10/29/2017   DYSPNEA 09/01/2007   Qualifier: Diagnosis of  By: Loanne Drilling MD, Sean A    FOOT PAIN 10/30/2009   Qualifier: Diagnosis of  By: Loanne Drilling MD, Sean A    GERD 06/19/2008   Qualifier: Diagnosis of  By: Loanne Drilling MD, Sean A    HEARING LOSS 06/19/2008   Qualifier: Diagnosis of  By: Loanne Drilling MD, Sean A    HYPERCHOLESTEROLEMIA 09/01/2007   Qualifier: Diagnosis of  By: Loanne Drilling MD, Sean A    Hyperglycemia 09/01/2007   Qualifier: Diagnosis of  By: Loanne Drilling MD, Sean A    INSOMNIA 06/19/2008   Qualifier: Diagnosis of  By: Loanne Drilling MD, Sean A    Low back pain 08/31/2013   NASH (nonalcoholic steatohepatitis) 09/28/2017   OSTEOARTHRITIS, 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   Qualifier: Diagnosis of  By: Loanne Drilling MD, Sean A    Rectal  bleeding 02/03/2016   Screening for prostate cancer 09/03/2014   Weight gain 09/28/2017   Wellness examination 09/26/2015    Past Surgical History:  Procedure Laterality Date   COLONOSCOPY     ELECTROCARDIOGRAM  05/28/2006   POLYPECTOMY     Stress Cardiolite  08/22/2003   TYMPANOPLASTY  03/1996   Right    Social History:  reports that he has quit smoking. His smoking use included cigarettes. He has never been exposed to tobacco smoke. He has never used smokeless tobacco. He reports current alcohol use. He reports that he does not use drugs.  Allergies: Allergies  Allergen Reactions   Zocor [Simvastatin]     REACTION: Rash    Family History:  Family History  Problem Relation Age of Onset   Stomach cancer Mother    Heart disease Mother    Gastric cancer Father        died age 81   Stroke Father    Colon cancer Sister 102   Other Brother        prostate issues, x 2 brothers   Rectal cancer Neg Hx    Crohn's disease Neg Hx    Esophageal cancer Neg Hx      Current Outpatient Medications:    atorvastatin (LIPITOR) 20 MG tablet, Take 1 tablet (20  mg total) by mouth at bedtime., Disp: 90 tablet, Rfl: 1   chlorhexidine (PERIDEX) 0.12 % solution, 1 mL by Mouth Rinse route daily., Disp: , Rfl:    dutasteride (AVODART) 0.5 MG capsule, Take 1 capsule (0.5 mg total) by mouth daily., Disp: 90 capsule, Rfl: 1   gabapentin (NEURONTIN) 100 MG capsule, Take 1 capsule (100 mg total) by mouth 3 (three) times daily., Disp: 90 capsule, Rfl: 1   meloxicam (MOBIC) 15 MG tablet, Take 1 tablet (15 mg total) by mouth daily., Disp: 30 tablet, Rfl: 0   Multiple Vitamins-Minerals (MULTIVITAMIN MEN 50+ PO), Take by mouth daily. TAKE ONE DILY, Disp: , Rfl:    omeprazole (PRILOSEC) 20 MG capsule, Take 1 capsule (20 mg total) by mouth as needed., Disp: 90 capsule, Rfl: 1   sertraline (ZOLOFT) 100 MG tablet, TAKE 1 TABLET(100 MG) BY MOUTH DAILY, Disp: 90 tablet, Rfl: 1   silodosin (RAPAFLO) 8 MG CAPS  capsule, Take 8 mg by mouth daily., Disp: , Rfl:    ALPRAZolam (XANAX) 0.5 MG tablet, TAKE 1 TABLET(0.5 MG) BY MOUTH THREE TIMES DAILY AS NEEDED, Disp: 90 tablet, Rfl: 1   Eszopiclone 3 MG TABS, TAKE 1 TABLET(3 MG) BY MOUTH AT BEDTIME, Disp: 90 tablet, Rfl: 1   metFORMIN (GLUCOPHAGE) 500 MG tablet, Take 1 tablet (500 mg total) by mouth 2 (two) times daily with a meal., Disp: 180 tablet, Rfl: 1  Review of Systems:  Constitutional: Denies fever, chills, diaphoresis, appetite change and fatigue.  HEENT: Denies photophobia, eye pain, redness, hearing loss, ear pain, congestion, sore throat, rhinorrhea, sneezing, mouth sores, trouble swallowing, neck pain, neck stiffness and tinnitus.   Respiratory: Denies SOB, DOE, cough, chest tightness,  and wheezing.   Cardiovascular: Denies chest pain, palpitations and leg swelling.  Gastrointestinal: Denies nausea, vomiting, abdominal pain, diarrhea, constipation, blood in stool and abdominal distention.  Genitourinary: Denies dysuria, urgency, frequency, hematuria, flank pain and difficulty urinating.  Endocrine: Denies: hot or cold intolerance, sweats, changes in hair or nails, polyuria, polydipsia. Musculoskeletal: Denies myalgias, back pain, joint swelling, arthralgias and gait problem.  Skin: Denies pallor, rash and wound.  Neurological: Denies dizziness, seizures, syncope, weakness, light-headedness and headaches.  Hematological: Denies adenopathy. Easy bruising, personal or family bleeding history  Psychiatric/Behavioral: Denies suicidal ideation, mood changes, confusion, nervousness, sleep disturbance and agitation    Physical Exam: Vitals:   03/26/22 1606  BP: 130/70  Pulse: 82  Temp: 97.7 F (36.5 C)  TempSrc: Oral  SpO2: 98%  Weight: 177 lb 12.8 oz (80.6 kg)    Body mass index is 26.84 kg/m.   Constitutional: NAD, calm, comfortable Eyes: PERRL, lids and conjunctivae normal ENMT: Mucous membranes are moist.  Psychiatric: Normal  judgment and insight. Alert and oriented x 3. Normal mood.   Diabetic Foot Exam - Simple   Simple Foot Form Diabetic Foot exam was performed with the following findings: Yes 03/26/2022  4:33 PM  Visual Inspection No deformities, no ulcerations, no other skin breakdown bilaterally: Yes Sensation Testing See comments: Yes Pulse Check Posterior Tibialis and Dorsalis pulse intact bilaterally: Yes Comments Decrease vibration and proprioception       Impression and Plan:  Type 2 diabetes mellitus with diabetic polyneuropathy, without long-term current use of insulin (HCC) - Plan: metFORMIN (GLUCOPHAGE) 500 MG tablet, POCT glycosylated hemoglobin (Hb A1C), gabapentin (NEURONTIN) 100 MG capsule  Insomnia, unspecified type - Plan: Eszopiclone 3 MG TABS, ALPRAZolam (XANAX) 0.5 MG tablet  -Diabetic foot exam performed today, he definitely has signs  of diabetic neuropathy with decrease vibration and proprioception. -He has decided to start gabapentin, will start low-dose 100 mg at bedtime. -Refills of Lunesta and alprazolam provided today.  Time spent:32 minutes reviewing chart, interviewing and examining patient and formulating plan of care.       Lelon Frohlich, MD [facility]

## 2022-04-08 ENCOUNTER — Other Ambulatory Visit: Payer: Self-pay

## 2022-04-10 ENCOUNTER — Encounter: Payer: Self-pay | Admitting: Cardiology

## 2022-04-10 ENCOUNTER — Ambulatory Visit: Payer: BC Managed Care – PPO | Attending: Cardiology | Admitting: Cardiology

## 2022-04-10 VITALS — BP 108/64 | HR 68 | Ht 68.0 in | Wt 182.0 lb

## 2022-04-10 DIAGNOSIS — I2510 Atherosclerotic heart disease of native coronary artery without angina pectoris: Secondary | ICD-10-CM

## 2022-04-10 DIAGNOSIS — E7800 Pure hypercholesterolemia, unspecified: Secondary | ICD-10-CM

## 2022-04-10 DIAGNOSIS — E1100 Type 2 diabetes mellitus with hyperosmolarity without nonketotic hyperglycemic-hyperosmolar coma (NKHHC): Secondary | ICD-10-CM | POA: Diagnosis not present

## 2022-04-10 NOTE — Progress Notes (Signed)
Cardiology Office Note:    Date:  04/10/2022   ID:  Luis Peterson, DOB March 24, 1956, MRN [id number]  PCP:  Isaac Bliss, Rayford Halsted, MD  Cardiologist:  Jenean Lindau, MD   Referring MD: Isaac Bliss, [place]*    ASSESSMENT:    1. Coronary artery disease involving native coronary artery of native heart without angina pectoris   2. Type 2 diabetes mellitus with hyperosmolarity without coma, without long-term current use of insulin ([place])   3. HYPERCHOLESTEROLEMIA    PLAN:    In order of problems listed above:  Coronary artery disease: Secondary prevention stressed with the patient.  Importance of compliance with diet medication stressed any vocalized understanding.  He was advised to walk at least half an hour a day 5 days a week and he promises to do so. Essential hypertension: Blood pressure stable and diet was emphasized. Mixed dyslipidemia: KPN sheet reviewed lipids reviewed discussed with the patient and they are fine. Diabetes mellitus: Lifestyle modification urged he promises to do better with exercise.Patient will be seen in follow-up appointment in 12 months or earlier if the patient has any concerns   Medication Adjustments/Labs and Tests Ordered: Current medicines are reviewed at length with the patient today.  Concerns regarding medicines are outlined above.  No orders of the defined types were placed in this encounter.  No orders of the defined types were placed in this encounter.    No chief complaint on file.    History of Present Illness:    Luis Peterson is a 66 y.o. male.  Patient has past medical history of coronary artery disease, essential hypertension, dyslipidemia and diabetes mellitus.  He denies any problems at this time and takes care of activities of daily living.  No chest pain orthopnea or PND.  He mentions to me that his wife has health issues and he is trying to support her.  He has been managing lax with his exercise for the same reason.  At  the time of my evaluation, the patient is alert awake oriented and in no distress.  Past Medical History:  Diagnosis Date   Anxiety state 01/03/2007   Qualifier: Diagnosis of  By: Loanne Drilling MD, Hilliard Clark A    Arthralgia 10/30/2009   Qualifier: Diagnosis of  By: Loanne Drilling MD, Sean A    BEN LOC HYPERPLASIA PROS W/UR OBST & OTH LUTS 06/19/2008   Dr. Consuella Lose   Bilateral shoulder pain 08/13/2016   CAD (coronary artery disease) 06/06/2020   Degenerative joint disease of cervical spine    Depression    Diabetes mellitus due to underlying condition with unspecified complications ([place]) 25/00/3704   DM (diabetes mellitus), type 2 ([place]) 10/20/2018   DOE (dyspnea on exertion) 10/29/2017   DYSPNEA 09/01/2007   Qualifier: Diagnosis of  By: Loanne Drilling MD, Sean A    FOOT PAIN 10/30/2009   Qualifier: Diagnosis of  By: Loanne Drilling MD, Sean A    GERD 06/19/2008   Qualifier: Diagnosis of  By: Loanne Drilling MD, Sean A    HEARING LOSS 06/19/2008   Qualifier: Diagnosis of  By: Loanne Drilling MD, Sean A    HYPERCHOLESTEROLEMIA 09/01/2007   Qualifier: Diagnosis of  By: Loanne Drilling MD, Sean A    Hyperglycemia 09/01/2007   Qualifier: Diagnosis of  By: Loanne Drilling MD, Sean A    INSOMNIA 06/19/2008   Qualifier: Diagnosis of  By: Loanne Drilling MD, Sean A    Low back pain 08/31/2013   NASH (nonalcoholic steatohepatitis) 09/28/2017   OSTEOARTHRITIS, 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   Qualifier: Diagnosis of  By: Loanne Drilling MD, Sean A    Rectal bleeding 02/03/2016   Screening for prostate cancer 09/03/2014   Weight gain 09/28/2017   Wellness examination 09/26/2015    Past Surgical History:  Procedure Laterality Date   COLONOSCOPY     ELECTROCARDIOGRAM  05/28/2006   POLYPECTOMY     Stress Cardiolite  08/22/2003   TYMPANOPLASTY  03/1996   Right    Current Medications: Current Meds  Medication Sig   ALPRAZolam (XANAX) 0.5 MG tablet TAKE 1  TABLET(0.5 MG) BY MOUTH THREE TIMES DAILY AS NEEDED (Patient taking differently: 0.5 mg 3 (three) times daily as needed for anxiety. TAKE 1 TABLET(0.5 MG) BY MOUTH THREE TIMES DAILY AS NEEDED)   atorvastatin (LIPITOR) 20 MG tablet Take 1 tablet (20 mg total) by mouth at bedtime.   dutasteride (AVODART) 0.5 MG capsule Take 1 capsule (0.5 mg total) by mouth daily.   Eszopiclone 3 MG TABS TAKE 1 TABLET(3 MG) BY MOUTH AT BEDTIME   meloxicam (MOBIC) 15 MG tablet Take 1 tablet (15 mg total) by mouth daily.   metFORMIN (GLUCOPHAGE) 500 MG tablet Take 1 tablet (500 mg total) by mouth 2 (two) times daily with a meal.   Multiple Vitamins-Minerals (MULTIVITAMIN MEN 50+ PO) Take 1 tablet by mouth daily.   omeprazole (PRILOSEC) 20 MG capsule Take 1 capsule (20 mg total) by mouth as needed.   sertraline (ZOLOFT) 100 MG tablet TAKE 1 TABLET(100 MG) BY MOUTH DAILY   silodosin (RAPAFLO) 8 MG CAPS capsule Take 8 mg by mouth daily.     Allergies:   Zocor [simvastatin]   Social History   Socioeconomic History   Marital status: Married    Spouse name: Not on file   Number of children: 1   Years of education: Not on file   Highest education level: Doctorate  Occupational History   Occupation: Secretary/administrator Professor     Employer: [employer]  Tobacco Use   Smoking status: Former    Years: 5.00    Types: Cigarettes    Passive exposure: Never   Smokeless tobacco: Never  Vaping Use   Vaping Use: Never used  Substance and Sexual Activity   Alcohol use: Yes    Comment: social   Drug use: No   Sexual activity: Not on file  Other Topics Concern   Not on file  Social History Narrative   Not on file   Social Determinants of Health   Financial Resource Strain: Low Risk  (03/04/2022)   Overall Financial Resource Strain (CARDIA)    Difficulty of Paying Living Expenses: Not hard at all  Food Insecurity: No Food Insecurity (03/04/2022)   Hunger Vital Sign    Worried About Running Out of Food in the Last  Year: Never true    [place] in the Last Year: Never true  Transportation Needs: No Transportation Needs (03/04/2022)   PRAPARE - Hydrologist (Medical): No    Lack of Transportation (Non-Medical): No  Physical Activity: Sufficiently Active (03/04/2022)   Exercise Vital Sign    Days of Exercise per Week: 5 days    Minutes of Exercise per Session: 30 min  Stress: Stress Concern Present (03/04/2022)   [place]    Feeling of Stress : To some  extent  Social Connections: Moderately Integrated (03/04/2022)   Social Connection and Isolation Panel [NHANES]    Frequency of Communication with Friends and Family: More than three times a week    Frequency of Social Gatherings with Friends and Family: Once a week    Attends Religious Services: Never    Marine scientist or Organizations: Yes    Attends Music therapist: 1 to 4 times per year    Marital Status: Married     Family History: The patient's family history includes Colon cancer (age of onset: 62) in his sister; Gastric cancer in his father; Heart disease in his mother; Other in his brother; Stomach cancer in his mother; Stroke in his father. There is no history of Rectal cancer, Crohn's disease, or Esophageal cancer.  ROS:   Please see the history of present illness.    All other systems reviewed and are negative.  EKGs/Labs/Other Studies Reviewed:    The following studies were reviewed today: EKG reveals sinus rhythm and nonspecific ST-T changes   Recent Labs: 04/17/2021: TSH 0.92 10/23/2021: ALT 20; BUN 12; Creatinine, Ser 0.73; Potassium 4.0; Sodium 138  Recent Lipid Panel    Component Value Date/Time   CHOL 144 10/23/2021 1549   CHOL 157 03/27/2021 1019   TRIG 179.0 (H) 10/23/2021 1549   TRIG 160 (H) 05/21/2006 0911   HDL 52.60 10/23/2021 1549   HDL 54 03/27/2021 1019   CHOLHDL 3 10/23/2021 1549    VLDL 35.8 10/23/2021 1549   LDLCALC 56 10/23/2021 1549   LDLCALC 73 03/27/2021 1019   LDLCALC 76 02/21/2020 1349   LDLDIRECT 81.0 10/19/2018 1024    Physical Exam:    VS:  BP 108/64 (BP Location: Left Arm, Patient Position: Sitting, Cuff Size: Normal)   Pulse 68   Ht 5' 8"  (1.727 m)   Wt 182 lb (82.6 kg)   SpO2 96%   BMI 27.67 kg/m     Wt Readings from Last 3 Encounters:  04/10/22 182 lb (82.6 kg)  03/26/22 177 lb 12.8 oz (80.6 kg)  03/25/22 177 lb 8 oz (80.5 kg)     GEN: Patient is in no acute distress HEENT: Normal NECK: No JVD; No carotid bruits LYMPHATICS: No lymphadenopathy CARDIAC: Hear sounds regular, 2/6 systolic murmur at the apex. RESPIRATORY:  Clear to auscultation without rales, wheezing or rhonchi  ABDOMEN: Soft, non-tender, non-distended MUSCULOSKELETAL:  No edema; No deformity  SKIN: Warm and dry NEUROLOGIC:  Alert and oriented x 3 PSYCHIATRIC:  Normal affect   Signed, Jenean Lindau, MD  04/10/2022 9:42 AM    [facility]

## 2022-04-10 NOTE — Patient Instructions (Signed)
Medication Instructions:  Your physician recommends that you continue on your current medications as directed. Please refer to the Current Medication list given to you today.  *If you need a refill on your cardiac medications before your next appointment, please call your pharmacy*   Lab Work: None If you have labs (blood work) drawn today and your tests are completely normal, you will receive your results only by: [facility] (if you have MyChart) OR A paper copy in the mail If you have any lab test that is abnormal or we need to change your treatment, we will call you to review the results.   Testing/Procedures: None   Follow-Up: At [facility], you and your health needs are our priority.  As part of our continuing mission to provide you with exceptional heart care, we have created designated Provider Care Teams.  These Care Teams include your primary Cardiologist (physician) and Advanced Practice Providers (APPs -  Physician Assistants and Nurse Practitioners) who all work together to provide you with the care you need, when you need it.  We recommend signing up for the patient portal called "MyChart".  Sign up information is provided on this After Visit Summary.  MyChart is used to connect with patients for Virtual Visits (Telemedicine).  Patients are able to view lab/test results, encounter notes, upcoming appointments, etc.  Non-urgent messages can be sent to your provider as well.   To learn more about what you can do with MyChart, go to [email or web address].    Your next appointment:   1 year(s)  The format for your next appointment:   In Person  Provider:   Jyl Heinz, MD  Other Instructions None  Important Information About Sugar

## 2022-04-23 ENCOUNTER — Encounter: Payer: Self-pay | Admitting: Internal Medicine

## 2022-04-23 ENCOUNTER — Ambulatory Visit: Payer: BC Managed Care – PPO | Admitting: Internal Medicine

## 2022-04-23 VITALS — BP 120/70 | HR 67 | Temp 98.2°F | Wt 177.9 lb

## 2022-04-23 DIAGNOSIS — E1142 Type 2 diabetes mellitus with diabetic polyneuropathy: Secondary | ICD-10-CM | POA: Diagnosis not present

## 2022-04-23 DIAGNOSIS — M549 Dorsalgia, unspecified: Secondary | ICD-10-CM

## 2022-04-23 MED ORDER — BLOOD GLUCOSE MONITOR KIT: PACK | 2 refills | Status: AC

## 2022-04-23 MED ORDER — MELOXICAM 7.5 MG PO TABS: 7.5000 mg | ORAL_TABLET | Freq: Every day | ORAL | 0 refills | Status: DC

## 2022-04-23 MED ORDER — METHOCARBAMOL 750 MG PO TABS: 750.0000 mg | ORAL_TABLET | Freq: Three times a day (TID) | ORAL | 1 refills | Status: AC | PRN

## 2022-04-23 NOTE — Progress Notes (Signed)
Established Patient Office Visit     CC/Reason for Visit: Upper back pain  HPI: Luis Peterson is a 66 y.o. male who is coming in today for the above mentioned reasons.  For the past few days has been having right upper back pain beneath his scapula.  Unfortunately his wife has been diagnosed with metastatic sarcoma and he has been having to help her go to the bathroom and do more heavy lifting around the house.  Pain feels sharp at times.   Past Medical/Surgical History: Past Medical History:  Diagnosis Date   Anxiety state 01/03/2007   Qualifier: Diagnosis of  By: Loanne Drilling MD, Hilliard Clark A    Arthralgia 10/30/2009   Qualifier: Diagnosis of  By: Loanne Drilling MD, Sean A    BEN LOC HYPERPLASIA PROS W/UR OBST & OTH LUTS 06/19/2008   Dr. Consuella Lose   Bilateral shoulder pain 08/13/2016   CAD (coronary artery disease) 06/06/2020   Degenerative joint disease of cervical spine    Depression    Diabetes mellitus due to underlying condition with unspecified complications ([place]) 76/28/3151   DM (diabetes mellitus), type 2 ([place]) 10/20/2018   DOE (dyspnea on exertion) 10/29/2017   DYSPNEA 09/01/2007   Qualifier: Diagnosis of  By: Loanne Drilling MD, Sean A    FOOT PAIN 10/30/2009   Qualifier: Diagnosis of  By: Loanne Drilling MD, Sean A    GERD 06/19/2008   Qualifier: Diagnosis of  By: Loanne Drilling MD, Sean A    HEARING LOSS 06/19/2008   Qualifier: Diagnosis of  By: Loanne Drilling MD, Sean A    HYPERCHOLESTEROLEMIA 09/01/2007   Qualifier: Diagnosis of  By: Loanne Drilling MD, Sean A    Hyperglycemia 09/01/2007   Qualifier: Diagnosis of  By: Loanne Drilling MD, Sean A    INSOMNIA 06/19/2008   Qualifier: Diagnosis of  By: Loanne Drilling MD, Sean A    Low back pain 08/31/2013   NASH (nonalcoholic steatohepatitis) 09/28/2017   OSTEOARTHRITIS, 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   Qualifier: Diagnosis of  By: Loanne Drilling MD, Sean A    Rectal bleeding  02/03/2016   Screening for prostate cancer 09/03/2014   Weight gain 09/28/2017   Wellness examination 09/26/2015    Past Surgical History:  Procedure Laterality Date   COLONOSCOPY     ELECTROCARDIOGRAM  05/28/2006   POLYPECTOMY     Stress Cardiolite  08/22/2003   TYMPANOPLASTY  03/1996   Right    Social History:  reports that he has quit smoking. His smoking use included cigarettes. He has never been exposed to tobacco smoke. He has never used smokeless tobacco. He reports current alcohol use. He reports that he does not use drugs.  Allergies: Allergies  Allergen Reactions   Zocor [Simvastatin]     REACTION: Rash    Family History:  Family History  Problem Relation Age of Onset   Stomach cancer Mother    Heart disease Mother    Gastric cancer Father        died age 63   Stroke Father    Colon cancer Sister 71   Other Brother        prostate issues, x 2 brothers   Rectal cancer Neg Hx    Crohn's disease Neg Hx    Esophageal cancer Neg Hx      Current Outpatient Medications:    ALPRAZolam (XANAX) 0.5 MG tablet, TAKE 1 TABLET(0.5  MG) BY MOUTH THREE TIMES DAILY AS NEEDED (Patient taking differently: 0.5 mg 3 (three) times daily as needed for anxiety. TAKE 1 TABLET(0.5 MG) BY MOUTH THREE TIMES DAILY AS NEEDED), Disp: 90 tablet, Rfl: 1   atorvastatin (LIPITOR) 20 MG tablet, Take 1 tablet (20 mg total) by mouth at bedtime., Disp: 90 tablet, Rfl: 1   chlorhexidine (PERIDEX) 0.12 % solution, 1 mL by Mouth Rinse route daily., Disp: , Rfl:    dutasteride (AVODART) 0.5 MG capsule, Take 1 capsule (0.5 mg total) by mouth daily., Disp: 90 capsule, Rfl: 1   Eszopiclone 3 MG TABS, TAKE 1 TABLET(3 MG) BY MOUTH AT BEDTIME, Disp: 90 tablet, Rfl: 1   gabapentin (NEURONTIN) 100 MG capsule, Take 1 capsule (100 mg total) by mouth 3 (three) times daily., Disp: 90 capsule, Rfl: 1   meloxicam (MOBIC) 7.5 MG tablet, Take 1 tablet (7.5 mg total) by mouth daily., Disp: 10 tablet, Rfl: 0    metFORMIN (GLUCOPHAGE) 500 MG tablet, Take 1 tablet (500 mg total) by mouth 2 (two) times daily with a meal., Disp: 180 tablet, Rfl: 1   methocarbamol (ROBAXIN-750) 750 MG tablet, Take 1 tablet (750 mg total) by mouth every 8 (eight) hours as needed for muscle spasms., Disp: 30 tablet, Rfl: 1   Multiple Vitamins-Minerals (MULTIVITAMIN MEN 50+ PO), Take 1 tablet by mouth daily., Disp: , Rfl:    omeprazole (PRILOSEC) 20 MG capsule, Take 1 capsule (20 mg total) by mouth as needed., Disp: 90 capsule, Rfl: 1   sertraline (ZOLOFT) 100 MG tablet, TAKE 1 TABLET(100 MG) BY MOUTH DAILY, Disp: 90 tablet, Rfl: 1   silodosin (RAPAFLO) 8 MG CAPS capsule, Take 8 mg by mouth daily., Disp: , Rfl:    blood glucose meter kit and supplies KIT, Dispense based on patient and insurance preference. Use once daily.  Dx E11.9, Disp: 1 each, Rfl: 2  Review of Systems:  Constitutional: Denies fever, chills, diaphoresis, appetite change and fatigue.  HEENT: Denies photophobia, eye pain, redness, hearing loss, ear pain, congestion, sore throat, rhinorrhea, sneezing, mouth sores, trouble swallowing, neck pain, neck stiffness and tinnitus.   Respiratory: Denies SOB, DOE, cough, chest tightness,  and wheezing.   Cardiovascular: Denies chest pain, palpitations and leg swelling.  Gastrointestinal: Denies nausea, vomiting, abdominal pain, diarrhea, constipation, blood in stool and abdominal distention.  Genitourinary: Denies dysuria, urgency, frequency, hematuria, flank pain and difficulty urinating.  Endocrine: Denies: hot or cold intolerance, sweats, changes in hair or nails, polyuria, polydipsia. Musculoskeletal: Denies myalgias, back pain, joint swelling, arthralgias and gait problem.  Skin: Denies pallor, rash and wound.  Neurological: Denies dizziness, seizures, syncope, weakness, light-headedness, numbness and headaches.  Hematological: Denies adenopathy. Easy bruising, personal or family bleeding history   Psychiatric/Behavioral: Denies suicidal ideation, mood changes, confusion, nervousness, sleep disturbance and agitation    Physical Exam: Vitals:   04/23/22 1521  BP: 120/70  Pulse: 67  Temp: 98.2 F (36.8 C)  TempSrc: Oral  SpO2: 98%  Weight: 177 lb 14.4 oz (80.7 kg)  PF: (!) 2 L/min    Body mass index is 27.05 kg/m.   Constitutional: NAD, calm, comfortable Eyes: PERRL, lids and conjunctivae normal, wears corrective lenses ENMT: Mucous membranes are moist.  Psychiatric: Normal judgment and insight. Alert and oriented x 3. Normal mood.    Impression and Plan:  Upper back pain - Plan: methocarbamol (ROBAXIN-750) 750 MG tablet, meloxicam (MOBIC) 7.5 MG tablet  Type 2 diabetes mellitus with diabetic polyneuropathy, without long-term current use of insulin (  [place]) - Plan: blood glucose meter kit and supplies KIT  -I suspect back pain is musculoskeletal.  I have advised Icing, local massage therapy, back stretches, have sent prescription for Robaxin and meloxicam.  If no improvement can consider referral to physical therapy. -Diabetic peripheral neuropathy is worsening, he will increase gabapentin to 200 mg at bedtime.  Time spent:30 minutes reviewing chart, interviewing and examining patient and formulating plan of care.       Lelon Frohlich, MD [facility]

## 2022-05-01 ENCOUNTER — Encounter: Payer: Self-pay | Admitting: Internal Medicine

## 2022-05-04 ENCOUNTER — Telehealth: Payer: BC Managed Care – PPO | Admitting: Physician Assistant

## 2022-05-04 DIAGNOSIS — U071 COVID-19: Secondary | ICD-10-CM

## 2022-05-04 MED ORDER — NAPROXEN 500 MG PO TABS: 500.0000 mg | ORAL_TABLET | Freq: Two times a day (BID) | ORAL | 0 refills | Status: DC

## 2022-05-04 MED ORDER — BENZONATATE 100 MG PO CAPS: 100.0000 mg | ORAL_CAPSULE | Freq: Three times a day (TID) | ORAL | 0 refills | Status: DC | PRN

## 2022-05-04 MED ORDER — FLUTICASONE PROPIONATE 50 MCG/ACT NA SUSP: 2.0000 | Freq: Every day | NASAL | 0 refills | Status: DC

## 2022-05-04 MED ORDER — PROMETHAZINE-DM 6.25-15 MG/5ML PO SYRP: 5.0000 mL | ORAL_SOLUTION | Freq: Four times a day (QID) | ORAL | 0 refills | Status: DC | PRN

## 2022-05-04 MED ORDER — ALBUTEROL SULFATE HFA 108 (90 BASE) MCG/ACT IN AERS: 1.0000 | INHALATION_SPRAY | Freq: Four times a day (QID) | RESPIRATORY_TRACT | 0 refills | Status: DC | PRN

## 2022-05-04 NOTE — Progress Notes (Signed)
E-Visit  for Positive Covid Test Result  We are sorry you are not feeling well. We are here to help!  You have tested positive for COVID-19, meaning that you were infected with the novel coronavirus and could give the virus to others.  It is vitally important that you stay home so you do not spread it to others.      Please continue isolation at home, for at least 10 days since the start of your symptoms and until you have had 24 hours with no fever (without taking a fever reducer) and with improving of symptoms.  If you have no symptoms but tested positive (or all symptoms resolve after 5 days and you have no fever) you can leave your house but continue to wear a mask around others for an additional 5 days. If you have a fever,continue to stay home until you have had 24 hours of no fever. Most cases improve 5-10 days from onset but we have seen a small number of patients who have gotten worse after the 10 days.  Please be sure to watch for worsening symptoms and remain taking the proper precautions.   Go to the nearest hospital ED for assessment if fever/cough/breathlessness are severe or illness seems like a threat to life.    The following symptoms may appear 2-14 days after exposure: Fever Cough Shortness of breath or difficulty breathing Chills Repeated shaking with chills Muscle pain Headache Sore throat New loss of taste or smell Fatigue Congestion or runny nose Nausea or vomiting Diarrhea  You have been enrolled in [facility] for COVID-19. Daily you will receive a questionnaire within the [facility] website. Our COVID-19 response team will be monitoring your responses daily.  You can use medication such as prescription cough medication called Tessalon Perles 100 mg. You may take 1-2 capsules every 8 hours as needed for cough,  prescription inhaler called Albuterol MDI 90 mcg /actuation 2 puffs every 4 hours as needed for shortness of breath, wheezing, cough, prescription  cough medication called Phenergan DM 6.25 mg/15 mg. You make take one teaspoon / 5 ml every 4-6 hours as needed for cough, prescription anti-inflammatory called Naprosyn 500 mg. Take twice daily as needed for fever or body aches for 2 weeks, and prescription for Fluticasone nasal spray 2 sprays in each nostril one time per day  You may also take acetaminophen (Tylenol) as needed for fever.  HOME CARE: Only take medications as instructed by your medical team. Drink plenty of fluids and get plenty of rest. A steam or ultrasonic humidifier can help if you have congestion.   GET HELP RIGHT AWAY IF YOU HAVE EMERGENCY WARNING SIGNS.  Call 911 or proceed to your closest emergency facility if: You develop worsening high fever. Trouble breathing Bluish lips or face Persistent pain or pressure in the chest New confusion Inability to wake or stay awake You cough up blood. Your symptoms become more severe Inability to hold down food or fluids  This list is not all possible symptoms. Contact your medical provider for any symptoms that are severe or concerning to you.    Your e-visit answers were reviewed by a board certified advanced clinical practitioner to complete your personal care plan.  Depending on the condition, your plan could have included both over the counter or prescription medications.  If there is a problem please reply once you have received a response from your provider.  Your safety is important to [place].  If you have drug  allergies check your prescription carefully.    You can use MyChart to ask questions about today's visit, request a non-urgent call back, or ask for a work or school excuse for 24 hours related to this e-Visit. If it has been greater than 24 hours you will need to follow up with your provider, or enter a new e-Visit to address those concerns. You will get an e-mail in the next two days asking about your experience.  I hope that your e-visit has been valuable and  will speed your recovery. Thank you for using e-visits.  I have spent 5 minutes in review of e-visit questionnaire, review and updating patient chart, medical decision making and response to patient.   Mar Daring, PA-C

## 2022-05-06 ENCOUNTER — Emergency Department (HOSPITAL_COMMUNITY): Payer: BC Managed Care – PPO

## 2022-05-06 ENCOUNTER — Emergency Department (HOSPITAL_COMMUNITY)
Admission: EM | Admit: 2022-05-06 | Discharge: 2022-05-06 | Disposition: A | Discharge status: Home / Self Care | Payer: BC Managed Care – PPO | Attending: Emergency Medicine | Admitting: Emergency Medicine

## 2022-05-06 DIAGNOSIS — U071 COVID-19: Secondary | ICD-10-CM | POA: Insufficient documentation

## 2022-05-06 DIAGNOSIS — E119 Type 2 diabetes mellitus without complications: Secondary | ICD-10-CM | POA: Diagnosis not present

## 2022-05-06 DIAGNOSIS — R42 Dizziness and giddiness: Secondary | ICD-10-CM | POA: Diagnosis present

## 2022-05-06 DIAGNOSIS — Z79899 Other long term (current) drug therapy: Secondary | ICD-10-CM | POA: Diagnosis not present

## 2022-05-06 LAB — RESP PANEL BY RT-PCR (RSV, FLU A&B, COVID)  RVPGX2
Influenza A by PCR: NEGATIVE
Influenza B by PCR: NEGATIVE
Resp Syncytial Virus by PCR: NEGATIVE
SARS Coronavirus 2 by RT PCR: POSITIVE — AB

## 2022-05-06 LAB — COMPREHENSIVE METABOLIC PANEL
ALT: 27 U/L (ref 0–44)
AST: 35 U/L (ref 15–41)
Albumin: 4.1 g/dL (ref 3.5–5.0)
Alkaline Phosphatase: 49 U/L (ref 38–126)
Anion gap: 12 (ref 5–15)
BUN: 12 mg/dL (ref 8–23)
CO2: 20 mmol/L — ABNORMAL LOW (ref 22–32)
Calcium: 9.2 mg/dL (ref 8.9–10.3)
Chloride: 106 mmol/L (ref 98–111)
Creatinine, Ser: 0.68 mg/dL (ref 0.61–1.24)
GFR, Estimated: >60 mL/min (ref >60)
Glucose, Bld: 168 mg/dL — ABNORMAL HIGH (ref 70–99)
Potassium: 3.9 mmol/L (ref 3.5–5.1)
Sodium: 138 mmol/L (ref 135–145)
Total Bilirubin: 0.6 mg/dL (ref 0.3–1.2)
Total Protein: 6.9 g/dL (ref 6.5–8.1)

## 2022-05-06 LAB — CBC
HCT: 40.4 % (ref 39.0–52.0)
Hemoglobin: 13.3 g/dL (ref 13.0–17.0)
MCH: 29.3 pg (ref 26.0–34.0)
MCHC: 32.9 g/dL (ref 30.0–36.0)
MCV: 89 fL (ref 80.0–100.0)
Platelets: ADEQUATE 10*3/uL (ref 150–400)
RBC: 4.54 MIL/uL (ref 4.22–5.81)
RDW: 11.7 % (ref 11.5–15.5)
WBC: 9.6 10*3/uL (ref 4.0–10.5)
nRBC: 0 % (ref 0.0–0.2)

## 2022-05-06 LAB — ETHANOL: Alcohol, Ethyl (B): <10 mg/dL (ref <10)

## 2022-05-06 LAB — CBG MONITORING, ED: Glucose-Capillary: 155 mg/dL — ABNORMAL HIGH (ref 70–99)

## 2022-05-06 LAB — TROPONIN I (HIGH SENSITIVITY): Troponin I (High Sensitivity): 3 ng/L (ref <18)

## 2022-05-06 MED ORDER — SODIUM CHLORIDE 0.9 % IV BOLUS
500.0000 mL | Freq: Once | INTRAVENOUS | Status: AC
  Administered 2022-05-06: 500 mL via INTRAVENOUS

## 2022-05-06 MED ORDER — ZIPRASIDONE MESYLATE 20 MG IM SOLR
20.0000 mg | Freq: Once | INTRAMUSCULAR | Status: AC
  Administered 2022-05-06: 20 mg via INTRAMUSCULAR
  Filled 2022-05-06: qty 20

## 2022-05-06 MED ORDER — STERILE WATER FOR INJECTION IJ SOLN
INTRAMUSCULAR | Status: AC
  Filled 2022-05-06: qty 10

## 2022-05-06 NOTE — ED Notes (Signed)
Pt to CT scan with oncoming RN.
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
## 2022-05-06 NOTE — Discharge Instructions (Addendum)
You came to the emergency department today you complaining of dizziness and lightheadedness.  When you arrived here very agitated and required medicating and restraints.  All of your lab work looks reassuring.  The scan of your brain also does not show any reason for this behavior.  As we discussed, you are taking a lot of medications that were prescribed by your doctor as well as over-the-counter medications for your COVID-19.  I suggest that you stop taking the promethazine cough syrup and the NyQuil.  If you are having a very bothersome cough due to your COVID, please only take one or the other.  Also, do not take your methocarbamol at this time.  Make an appointment with your primary care doctor tomorrow to discuss what happened today.  Likely, it does not appear as though you have had a heart attack either.  All of your symptoms are likely secondary to your COVID infection.  Please do not hesitate to return with any worsening or recurring symptoms.  It was a pleasure to meet you and we hope you feel better!
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
## 2022-05-08 ENCOUNTER — Encounter: Payer: Self-pay | Admitting: Internal Medicine

## 2022-05-14 ENCOUNTER — Telehealth: Payer: Self-pay | Admitting: Family Medicine

## 2022-05-14 NOTE — Telephone Encounter (Signed)
Patients wife is a patient with Dr Lorelei Pont  Patient asked if he could switch to Dr Lorelei Pont

## 2022-05-17 ENCOUNTER — Other Ambulatory Visit: Payer: Self-pay | Admitting: Internal Medicine

## 2022-05-17 DIAGNOSIS — E1142 Type 2 diabetes mellitus with diabetic polyneuropathy: Secondary | ICD-10-CM

## 2022-05-18 NOTE — Telephone Encounter (Signed)
Lvm for  patient to schedule appt

## 2022-06-11 NOTE — Progress Notes (Addendum)
[place] [facility] [place], [place], [place] [address] [phone]  Date:  06/15/2022   Name:  Luis Peterson   DOB:  November 07, 1955   MRN:  [id number]  PCP:  Darreld Mclean, MD    Chief Complaint: Transitions Of Care   History of Present Illness:  Luis Peterson is a 67 y.o. very pleasant male patient who presents with the following:  Seen today as a new patient I take care of his wife Luis Peterson - she is my long term patient  Luis Peterson has been sick recently with cancer which has been really difficult Pt notes he is himself doing overall  He is a professor of IT and AI at [employer]  History of diabetes, CAD, hyperlipidemia  Never needed any intervention for his CAD  His cardiologist is DR Revankar - visit in December  1. Coronary artery disease involving native coronary artery of native heart without angina pectoris   2. Type 2 diabetes mellitus with hyperosmolarity without coma, without long-term current use of insulin ([place])   3. HYPERCHOLESTEROLEMIA     PLAN:     In order of problems listed above:   Coronary artery disease: Secondary prevention stressed with the patient.  Importance of compliance with diet medication stressed any vocalized understanding.  He was advised to walk at least half an hour a day 5 days a week and he promises to do so. Essential hypertension: Blood pressure stable and diet was emphasized. Mixed dyslipidemia: KPN sheet reviewed lipids reviewed discussed with the patient and they are fine. Diabetes mellitus: Lifestyle modification urged he promises to do better with exercise.Patient will be seen in follow-up appointment in 12 months or earlier if the patient has any concerns   Lab Results  Component Value Date   HGBA1C 5.6 03/26/2022   He is on metformin 500 twice a day   He notes a strong family history of CAD His DM is under very good control - he was dx a year or so ago, and made some  lifestyle changes and lost some weight  He notes that his feet tend to feel very cold, and he may feel like he is walking on rocks or glass- sounds like neuropathy He is taking some gabapentin - he is taking 100 mg BID  He did a stress in 2019- did well  The left ventricular ejection fraction is hyperdynamic (>65%). Nuclear stress EF: 69%. Blood pressure demonstrated a normal response to exercise. There was no ST segment deviation noted during stress. No T wave inversion was noted during stress. The study is normal. This is a low risk study. Low risk stress nuclear study with normal perfusion and normal left ventricular regional and global systolic function.   He has a urologist- Dr Lucia Gaskins who manages his PSA  Patient Active Problem List   Diagnosis Date Noted   CAD (coronary artery disease) 06/06/2020   Diabetes mellitus due to underlying condition with unspecified complications ([place]) 25/00/3704   Degenerative joint disease of cervical spine    Depression    DM (diabetes mellitus), type 2 ([place]) 10/20/2018   DOE (dyspnea on exertion) 10/29/2017   Weight gain 09/28/2017   NASH (nonalcoholic steatohepatitis) 09/28/2017   Bilateral shoulder pain 08/13/2016   Rectal bleeding 02/03/2016   Wellness examination 09/26/2015   Screening for prostate cancer 09/03/2014   Low back pain 08/31/2013   Arthralgia 10/30/2009   FOOT PAIN 10/30/2009  HEARING LOSS 06/19/2008   GERD 06/19/2008   BEN LOC HYPERPLASIA PROS W/UR OBST & OTH LUTS 06/19/2008   OSTEOARTHRITIS, 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 pylori test 07/1997    Past Medical History:  Diagnosis Date   Anxiety state 01/03/2007   Qualifier: Diagnosis of  By: Loanne Drilling MD, Hilliard Clark A    Arthralgia 10/30/2009   Qualifier: Diagnosis of  By: Loanne Drilling MD, Sean A    BEN LOC HYPERPLASIA  PROS W/UR OBST & OTH LUTS 06/19/2008   Dr. Consuella Lose   Bilateral shoulder pain 08/13/2016   CAD (coronary artery disease) 06/06/2020   Degenerative joint disease of cervical spine    Depression    Diabetes mellitus due to underlying condition with unspecified complications ([place]) 00/93/8182   DM (diabetes mellitus), type 2 ([place]) 10/20/2018   DOE (dyspnea on exertion) 10/29/2017   DYSPNEA 09/01/2007   Qualifier: Diagnosis of  By: Loanne Drilling MD, Sean A    FOOT PAIN 10/30/2009   Qualifier: Diagnosis of  By: Loanne Drilling MD, Sean A    GERD 06/19/2008   Qualifier: Diagnosis of  By: Loanne Drilling MD, Sean A    HEARING LOSS 06/19/2008   Qualifier: Diagnosis of  By: Loanne Drilling MD, Sean A    HYPERCHOLESTEROLEMIA 09/01/2007   Qualifier: Diagnosis of  By: Loanne Drilling MD, Sean A    Hyperglycemia 09/01/2007   Qualifier: Diagnosis of  By: Loanne Drilling MD, Sean A    INSOMNIA 06/19/2008   Qualifier: Diagnosis of  By: Loanne Drilling MD, Sean A    Low back pain 08/31/2013   NASH (nonalcoholic steatohepatitis) 09/28/2017   OSTEOARTHRITIS, 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   Qualifier: Diagnosis of  By: Loanne Drilling MD, Sean A    Rectal bleeding 02/03/2016   Screening for prostate cancer 09/03/2014   Weight gain 09/28/2017   Wellness examination 09/26/2015    Past Surgical History:  Procedure Laterality Date   COLONOSCOPY     ELECTROCARDIOGRAM  05/28/2006   POLYPECTOMY     Stress Cardiolite  08/22/2003   TYMPANOPLASTY  03/1996   Right    Social History   Tobacco Use   Smoking status: Former    Years: 5.00    Types: Cigarettes    Passive exposure: Never   Smokeless tobacco: Never  Vaping Use   Vaping Use: Never used  Substance Use Topics   Alcohol use: Yes    Comment: social   Drug use: No    Family History  Problem Relation Age of Onset   Stomach cancer Mother    Heart disease Mother    Gastric cancer Father         died age 70   Stroke Father    Colon cancer Sister 32   Other Brother        prostate issues, x 2 brothers   Rectal cancer Neg Hx    Crohn's disease Neg Hx    Esophageal cancer Neg Hx     Allergies  Allergen Reactions   Zocor [Simvastatin]     REACTION: Rash    Medication list has been reviewed and updated.  Current Outpatient Medications on File Prior to Visit  Medication Sig Dispense Refill   albuterol (VENTOLIN HFA) 108 (90 Base) MCG/ACT inhaler  Inhale 1-2 puffs into the lungs every 6 (six) hours as needed. 8 g 0   ALPRAZolam (XANAX) 0.5 MG tablet TAKE 1 TABLET(0.5 MG) BY MOUTH THREE TIMES DAILY AS NEEDED (Patient taking differently: 0.5 mg 3 (three) times daily as needed for anxiety. TAKE 1 TABLET(0.5 MG) BY MOUTH THREE TIMES DAILY AS NEEDED) 90 tablet 1   atorvastatin (LIPITOR) 20 MG tablet Take 1 tablet (20 mg total) by mouth at bedtime. 90 tablet 1   benzonatate (TESSALON) 100 MG capsule Take 1 capsule (100 mg total) by mouth 3 (three) times daily as needed. 30 capsule 0   blood glucose meter kit and supplies KIT Dispense based on patient and insurance preference. Use once daily.  Dx E11.9 1 each 2   chlorhexidine (PERIDEX) 0.12 % solution 1 mL by Mouth Rinse route daily.     dutasteride (AVODART) 0.5 MG capsule Take 1 capsule (0.5 mg total) by mouth daily. 90 capsule 1   Eszopiclone 3 MG TABS TAKE 1 TABLET(3 MG) BY MOUTH AT BEDTIME 90 tablet 1   fluticasone (FLONASE) 50 MCG/ACT nasal spray Place 2 sprays into both nostrils daily. 16 g 0   gabapentin (NEURONTIN) 100 MG capsule TAKE 1 CAPSULE(100 MG) BY MOUTH THREE TIMES DAILY 90 capsule 1   metFORMIN (GLUCOPHAGE) 500 MG tablet Take 1 tablet (500 mg total) by mouth 2 (two) times daily with a meal. 180 tablet 1   Multiple Vitamins-Minerals (MULTIVITAMIN MEN 50+ PO) Take 1 tablet by mouth daily.     naproxen (NAPROSYN) 500 MG tablet Take 1 tablet (500 mg total) by mouth 2 (two) times daily with a meal. 30 tablet 0   omeprazole  (PRILOSEC) 20 MG capsule Take 1 capsule (20 mg total) by mouth as needed. 90 capsule 1   sertraline (ZOLOFT) 100 MG tablet TAKE 1 TABLET(100 MG) BY MOUTH DAILY 90 tablet 1   silodosin (RAPAFLO) 8 MG CAPS capsule Take 8 mg by mouth daily.     meloxicam (MOBIC) 7.5 MG tablet Take 1 tablet (7.5 mg total) by mouth daily. (Patient not taking: Reported on 06/15/2022) 10 tablet 0   promethazine-dextromethorphan (PROMETHAZINE-DM) 6.25-15 MG/5ML syrup Take 5 mLs by mouth 4 (four) times daily as needed. (Patient not taking: Reported on 06/15/2022) 118 mL 0   No current facility-administered medications on file prior to visit.    Review of Systems:  As per HPI- otherwise negative.   Physical Examination: Vitals:   06/15/22 1304  BP: 122/68  Pulse: 70  Resp: 18  Temp: 98 F (36.7 C)  SpO2: 99%   Vitals:   06/15/22 1304  Weight: 171 lb 12.8 oz (77.9 kg)  Height: '5\' 8"'$  (1.727 m)   Body mass index is 26.12 kg/m. Ideal Body Weight: Weight in (lb) to have BMI = 25: 164.1  GEN: no acute distress.  Minimal overweight, looks well  HEENT: Atraumatic, Normocephalic.  Bilateral TM wnl, oropharynx normal.  PEERL,EOMI.   Ears and Nose: No external deformity. CV: RRR, No M/G/R. No JVD. No thrill. No extra heart sounds. PULM: CTA B, no wheezes, crackles, rhonchi. No retractions. No resp. distress. No accessory muscle use. ABD: S, NT, ND, +BS. No rebound. No HSM. EXTR: No c/c/e PSYCH: Normally interactive. Conversant.  Foot exam: normal   Assessment and Plan: Type 2 diabetes mellitus with diabetic polyneuropathy, without long-term current use of insulin (HCC) - Plan: Ferritin, Folate, B12  Pure hypercholesterolemia  Screening for deficiency anemia - Plan: CBC  Immunization due - Plan: Pneumococcal  conjugate vaccine 20-valent (Prevnar 20)  Need for pneumococcal 20-valent conjugate vaccination  Establishing care today Labs are UTD His main concern is neuropathy pain in his feet- it does not  really bother him as far as pain, but it does worry him.  Most likely diabetes is the cause Will check ferritin, folate and B12 to try and rule out other secondary causes  Advised he does not have to use gabapentin if he does not wish to- he does not really feel like it helps him   Signed Lamar Blinks, MD  Received labs as below, message to patient Results for orders placed or performed in visit on 06/15/22  Ferritin  Result Value Ref Range   Ferritin 43.9 22.0 - 322.0 ng/mL  Folate  Result Value Ref Range   Folate >23.8 >5.9 ng/mL  CBC  Result Value Ref Range   WBC 7.3 4.0 - 10.5 K/uL   RBC 4.45 4.22 - 5.81 Mil/uL   Platelets 177.0 150.0 - 400.0 K/uL   Hemoglobin 13.2 13.0 - 17.0 g/dL   HCT 38.9 (L) 39.0 - 52.0 %   MCV 87.4 78.0 - 100.0 fl   MCHC 33.9 30.0 - 36.0 g/dL   RDW 12.9 11.5 - 15.5 %  B12  Result Value Ref Range   Vitamin B-12 551 211 - 911 pg/mL

## 2022-06-15 ENCOUNTER — Ambulatory Visit: Payer: BC Managed Care – PPO | Admitting: Family Medicine

## 2022-06-15 ENCOUNTER — Encounter: Payer: Self-pay | Admitting: Family Medicine

## 2022-06-15 VITALS — BP 122/68 | HR 70 | Temp 98.0°F | Resp 18 | Ht 68.0 in | Wt 171.8 lb

## 2022-06-15 DIAGNOSIS — Z130 Encounter for screening for diseases of the blood and blood-forming organs and certain disorders involving the immune mechanism: Secondary | ICD-10-CM | POA: Diagnosis not present

## 2022-06-15 DIAGNOSIS — Z23 Encounter for immunization: Secondary | ICD-10-CM | POA: Diagnosis not present

## 2022-06-15 DIAGNOSIS — E7800 Pure hypercholesterolemia, unspecified: Secondary | ICD-10-CM | POA: Diagnosis not present

## 2022-06-15 DIAGNOSIS — E1142 Type 2 diabetes mellitus with diabetic polyneuropathy: Secondary | ICD-10-CM | POA: Diagnosis not present

## 2022-06-15 LAB — CBC
HCT: 38.9 % — ABNORMAL LOW (ref 39.0–52.0)
Hemoglobin: 13.2 g/dL (ref 13.0–17.0)
MCHC: 33.9 g/dL (ref 30.0–36.0)
MCV: 87.4 fl (ref 78.0–100.0)
Platelets: 177 10*3/uL (ref 150.0–400.0)
RBC: 4.45 Mil/uL (ref 4.22–5.81)
RDW: 12.9 % (ref 11.5–15.5)
WBC: 7.3 10*3/uL (ref 4.0–10.5)

## 2022-06-15 LAB — FOLATE: Folate: >23.8 ng/mL (ref >5.9)

## 2022-06-15 LAB — FERRITIN: Ferritin: 43.9 ng/mL (ref 22.0–322.0)

## 2022-06-15 LAB — VITAMIN B12: Vitamin B-12: 551 pg/mL (ref 211–911)

## 2022-06-15 NOTE — Patient Instructions (Signed)
Good to see you today- I will be in touch with your labs asap We will check for deficiency of iron, B12 or folate which could cause neuropathy symptoms We can have you schedule a lab visit only at your convenience to check your testosterone also - needs to be done by 8:30 am  Pneumonia vaccine today

## 2022-06-19 ENCOUNTER — Other Ambulatory Visit (INDEPENDENT_AMBULATORY_CARE_PROVIDER_SITE_OTHER): Payer: BC Managed Care – PPO

## 2022-06-19 ENCOUNTER — Other Ambulatory Visit: Payer: Self-pay | Admitting: *Deleted

## 2022-06-19 DIAGNOSIS — R7989 Other specified abnormal findings of blood chemistry: Secondary | ICD-10-CM

## 2022-06-20 ENCOUNTER — Encounter: Payer: Self-pay | Admitting: Family Medicine

## 2022-06-20 LAB — TESTOSTERONE TOTAL,FREE,BIO, MALES
Albumin: 4.3 g/dL (ref 3.6–5.1)
Sex Hormone Binding: 38 nmol/L (ref 22–77)
Testosterone, Bioavailable: 71.6 ng/dL — ABNORMAL LOW (ref 110.0–575.0)
Testosterone, Free: 36.4 pg/mL — ABNORMAL LOW (ref 46.0–224.0)
Testosterone: 317 ng/dL (ref 250–827)

## 2022-06-27 ENCOUNTER — Other Ambulatory Visit: Payer: Self-pay | Admitting: Internal Medicine

## 2022-06-27 DIAGNOSIS — G4700 Insomnia, unspecified: Secondary | ICD-10-CM

## 2022-07-08 ENCOUNTER — Encounter: Payer: BC Managed Care – PPO | Admitting: Family Medicine

## 2022-07-08 ENCOUNTER — Encounter: Payer: Self-pay | Admitting: Family Medicine

## 2022-07-08 DIAGNOSIS — G4700 Insomnia, unspecified: Secondary | ICD-10-CM

## 2022-07-09 MED ORDER — ESZOPICLONE 3 MG PO TABS: ORAL_TABLET | ORAL | 0 refills | Status: DC

## 2022-07-09 MED ORDER — ALPRAZOLAM 0.5 MG PO TABS: 0.5000 mg | ORAL_TABLET | Freq: Three times a day (TID) | ORAL | 2 refills | Status: DC | PRN

## 2022-07-14 ENCOUNTER — Other Ambulatory Visit: Payer: Self-pay | Admitting: Internal Medicine

## 2022-07-14 DIAGNOSIS — E1142 Type 2 diabetes mellitus with diabetic polyneuropathy: Secondary | ICD-10-CM

## 2022-07-19 ENCOUNTER — Encounter: Payer: Self-pay | Admitting: Family Medicine

## 2022-08-20 ENCOUNTER — Other Ambulatory Visit: Payer: Self-pay | Admitting: Internal Medicine

## 2022-08-20 DIAGNOSIS — E7800 Pure hypercholesterolemia, unspecified: Secondary | ICD-10-CM

## 2022-08-20 DIAGNOSIS — F330 Major depressive disorder, recurrent, mild: Secondary | ICD-10-CM

## 2022-09-06 ENCOUNTER — Encounter: Payer: Self-pay | Admitting: Family Medicine

## 2022-09-21 ENCOUNTER — Encounter: Payer: Self-pay | Admitting: Family Medicine

## 2022-09-21 ENCOUNTER — Encounter: Payer: Self-pay | Admitting: Gastroenterology

## 2022-09-22 ENCOUNTER — Other Ambulatory Visit: Payer: Self-pay | Admitting: Family Medicine

## 2022-09-22 DIAGNOSIS — G4700 Insomnia, unspecified: Secondary | ICD-10-CM

## 2022-09-25 ENCOUNTER — Encounter (INDEPENDENT_AMBULATORY_CARE_PROVIDER_SITE_OTHER): Payer: BC Managed Care – PPO | Admitting: Family Medicine

## 2022-09-25 DIAGNOSIS — F330 Major depressive disorder, recurrent, mild: Secondary | ICD-10-CM

## 2022-10-13 ENCOUNTER — Other Ambulatory Visit: Payer: Self-pay | Admitting: Internal Medicine

## 2022-10-13 DIAGNOSIS — K219 Gastro-esophageal reflux disease without esophagitis: Secondary | ICD-10-CM

## 2022-10-13 LAB — HM DIABETES EYE EXAM

## 2022-10-17 ENCOUNTER — Other Ambulatory Visit: Payer: Self-pay | Admitting: Family Medicine

## 2022-10-17 DIAGNOSIS — G4700 Insomnia, unspecified: Secondary | ICD-10-CM

## 2022-10-27 DIAGNOSIS — F330 Major depressive disorder, recurrent, mild: Secondary | ICD-10-CM | POA: Diagnosis not present

## 2022-10-27 NOTE — Telephone Encounter (Signed)
Please see the MyChart message reply(ies) for my assessment and plan.  The patient gave consent for this Medical Advice Message and is aware that it may result in a bill to their insurance company as well as the possibility that this may result in a co-payment or deductible. They are an established patient, but are not seeking medical advice exclusively about a problem treated during an in person or video visit in the last 7 days. I did not recommend an in person or video visit within 7 days of my reply.  I spent a total of 10 minutes cumulative time within 7 days through MyChart messaging Alis Sawchuk, MD  

## 2022-11-26 NOTE — Patient Instructions (Incomplete)
It was good to see you again today-we continue to think about you and Luis Peterson  I will be in touch with your labs as soon as possible I ordered x-rays for your hips; please go by [facility] at your convenience to have these x-rays taken   Let's switch over from sertraline to venlafaxine/ effexor Cut sertraline to 100 mg for 2-3 days, then 50 mg for 2-3 days- then stop and switch to 75 mg venlafaxine Can double to 150 mg after 1-2 weeks if desired Please let me know how this works for you  Voltaren gel may be helpful for your hands!

## 2022-11-26 NOTE — Progress Notes (Unsigned)
[facility] [address], [address] [place], [place] [address] [phone]  Date:  11/30/2022   Name:  Luis Peterson   DOB:  Oct 01, 1955   MRN:  [id number]  PCP:  Pearline Cables, MD    Chief Complaint: No chief complaint on file.   History of Present Illness:  Luis Peterson is a 67 y.o. very pleasant male patient who presents with the following:  Patient seen today to discuss problems with arthritis Dammon lost his wife Corrie Dandy earlier this spring after a battle with cancer, of course this has been very difficult  Our most recent office visit together was in February He is a professor of IT and AI at [employer]   History of diabetes, CAD, hyperlipidemia  Never needed any intervention for his CAD   His cardiologist is Dr Tomie China - visit in December: Coronary artery disease: Secondary prevention stressed with the patient.  Importance of compliance with diet medication stressed any vocalized understanding.  He was advised to walk at least half an hour a day 5 days a week and he promises to do so. Essential hypertension: Blood pressure stable and diet was emphasized. Mixed dyslipidemia: KPN sheet reviewed lipids reviewed discussed with the patient and they are fine. Diabetes mellitus: Lifestyle modification urged he promises to do better with exercise.Patient will be seen in follow-up appointment in 12 months or earlier if the patient has any concerns  Can update A1c Needs urine micro Eye exam Shingrix, pneumonia up-to-date  Most recent CMP was in December He had a ferritin, folate, B12, CBC in February Testosterone was checked in February-free slightly low He does have urology care with [facility]-?  Most recent visit  Lab Results  Component Value Date   HGBA1C 5.6 03/26/2022   Alprazolam as needed Lipitor 20 Lunesta 3 Gabapentin 100 3 times daily-?  Still taking Meloxicam-not taking Metformin 500 twice  daily Naproxen Omeprazole Sertraline 100 Patient Active Problem List   Diagnosis Date Noted   CAD (coronary artery disease) 06/06/2020   Diabetes mellitus due to underlying condition with unspecified complications (HCC) 02/02/2020   Degenerative joint disease of cervical spine    Depression    DM (diabetes mellitus), type 2 (HCC) 10/20/2018   DOE (dyspnea on exertion) 10/29/2017   Weight gain 09/28/2017   NASH (nonalcoholic steatohepatitis) 09/28/2017   Bilateral shoulder pain 08/13/2016   Rectal bleeding 02/03/2016   Wellness examination 09/26/2015   Screening for prostate cancer 09/03/2014   Low back pain 08/31/2013   Arthralgia 10/30/2009   FOOT PAIN 10/30/2009   HEARING LOSS 06/19/2008   GERD 06/19/2008   BEN LOC HYPERPLASIA PROS W/UR OBST & OTH LUTS 06/19/2008   OSTEOARTHRITIS, 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 pylori test 07/1997    Past Medical History:  Diagnosis Date   Anxiety state 01/03/2007   Qualifier: Diagnosis of  By: Everardo All MD, Gregary Signs A    Arthralgia 10/30/2009   Qualifier: Diagnosis of  By: Everardo All MD, Sean A    BEN LOC HYPERPLASIA PROS W/UR OBST & OTH LUTS 06/19/2008   Dr. [name] Fear   Bilateral shoulder pain 08/13/2016   CAD (coronary artery disease) 06/06/2020   Degenerative joint disease of cervical spine    Depression    Diabetes mellitus due to underlying  condition with unspecified complications (HCC) 02/02/2020   DM (diabetes mellitus), type 2 (HCC) 10/20/2018   DOE (dyspnea on exertion) 10/29/2017   DYSPNEA 09/01/2007   Qualifier: Diagnosis of  By: Everardo All MD, Sean A    FOOT PAIN 10/30/2009   Qualifier: Diagnosis of  By: Everardo All MD, Sean A    GERD 06/19/2008   Qualifier: Diagnosis of  By: Everardo All MD, Sean A    HEARING LOSS 06/19/2008   Qualifier: Diagnosis of  By: Everardo All MD, Cleophas Dunker     HYPERCHOLESTEROLEMIA 09/01/2007   Qualifier: Diagnosis of  By: Everardo All MD, Sean A    Hyperglycemia 09/01/2007   Qualifier: Diagnosis of  By: Everardo All MD, Sean A    INSOMNIA 06/19/2008   Qualifier: Diagnosis of  By: Everardo All MD, Sean A    Low back pain 08/31/2013   NASH (nonalcoholic steatohepatitis) 09/28/2017   OSTEOARTHRITIS, SPINE 06/19/2008   Qualifier: Diagnosis of  By: Everardo All MD, Sean A    Positive H. 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   Qualifier: Diagnosis of  By: Everardo All MD, Sean A    Rectal bleeding 02/03/2016   Screening for prostate cancer 09/03/2014   Weight gain 09/28/2017   Wellness examination 09/26/2015    Past Surgical History:  Procedure Laterality Date   COLONOSCOPY     ELECTROCARDIOGRAM  05/28/2006   POLYPECTOMY     Stress Cardiolite  08/22/2003   TYMPANOPLASTY  03/1996   Right    Social History   Tobacco Use   Smoking status: Former    Types: Cigarettes    Passive exposure: Never   Smokeless tobacco: Never  Vaping Use   Vaping status: Never Used  Substance Use Topics   Alcohol use: Yes    Comment: social   Drug use: No    Family History  Problem Relation Age of Onset   Stomach cancer Mother    Heart disease Mother    Gastric cancer Father        died age 65   Stroke Father    Colon cancer Sister 32   Other Brother        prostate issues, x 2 brothers   Rectal cancer Neg Hx    Crohn's disease Neg Hx    Esophageal cancer Neg Hx     Allergies  Allergen Reactions   Zocor [Simvastatin]     REACTION: Rash    Medication list has been reviewed and updated.  Current Outpatient Medications on File Prior to Visit  Medication Sig Dispense Refill   albuterol (VENTOLIN HFA) 108 (90 Base) MCG/ACT inhaler Inhale 1-2 puffs into the lungs every 6 (six) hours as needed. 8 g 0   ALPRAZolam (XANAX) 0.5 MG tablet TAKE 1 TABLET(0.5 MG) BY MOUTH THREE TIMES DAILY AS NEEDED FOR ANXIETY 90 tablet 1   atorvastatin (LIPITOR) 20 MG tablet TAKE  1 TABLET BY MOUTH DAILY AT 6 PM 90 tablet 1   benzonatate (TESSALON) 100 MG capsule Take 1 capsule (100 mg total) by mouth 3 (three) times daily as needed. 30 capsule 0   blood glucose meter kit and supplies KIT Dispense based on patient and insurance preference. Use once daily.  Dx E11.9 1 each 2   chlorhexidine (PERIDEX) 0.12 % solution 1 mL by Mouth Rinse route daily.     dutasteride (AVODART) 0.5 MG capsule Take 1 capsule (0.5 mg total) by mouth daily. 90 capsule 1   Eszopiclone 3 MG TABS TAKE 1 TABLET BY MOUTH AT  BEDTIME AS NEEDED FOR INSOMNIA 90 tablet 0   fluticasone (FLONASE) 50 MCG/ACT nasal spray Place 2 sprays into both nostrils daily. 16 g 0   gabapentin (NEURONTIN) 100 MG capsule TAKE 1 CAPSULE(100 MG) BY MOUTH THREE TIMES DAILY 90 capsule 1   meloxicam (MOBIC) 7.5 MG tablet Take 1 tablet (7.5 mg total) by mouth daily. (Patient not taking: Reported on 06/15/2022) 10 tablet 0   metFORMIN (GLUCOPHAGE) 500 MG tablet Take 1 tablet (500 mg total) by mouth 2 (two) times daily with a meal. 180 tablet 1   Multiple Vitamins-Minerals (MULTIVITAMIN MEN 50+ PO) Take 1 tablet by mouth daily.     naproxen (NAPROSYN) 500 MG tablet Take 1 tablet (500 mg total) by mouth 2 (two) times daily with a meal. 30 tablet 0   omeprazole (PRILOSEC) 20 MG capsule TAKE ONE CAPSULE BY MOUTH AS NEEDED 90 capsule 1   promethazine-dextromethorphan (PROMETHAZINE-DM) 6.25-15 MG/5ML syrup Take 5 mLs by mouth 4 (four) times daily as needed. (Patient not taking: Reported on 06/15/2022) 118 mL 0   sertraline (ZOLOFT) 100 MG tablet TAKE 1 TABLET(100 MG) BY MOUTH DAILY 90 tablet 1   silodosin (RAPAFLO) 8 MG CAPS capsule Take 8 mg by mouth daily.     No current facility-administered medications on file prior to visit.    Review of Systems:  As per HPI- otherwise negative.   Physical Examination: There were no vitals filed for this visit. There were no vitals filed for this visit. There is no height or weight on file to  calculate BMI. Ideal Body Weight:    GEN: no acute distress. HEENT: Atraumatic, Normocephalic.  Ears and Nose: No external deformity. CV: RRR, No M/G/R. No JVD. No thrill. No extra heart sounds. PULM: CTA B, no wheezes, crackles, rhonchi. No retractions. No resp. distress. No accessory muscle use. ABD: S, NT, ND, +BS. No rebound. No HSM. EXTR: No c/c/e PSYCH: Normally interactive. Conversant.    Assessment and Plan: ***  Signed Abbe Amsterdam, MD

## 2022-11-30 ENCOUNTER — Other Ambulatory Visit: Payer: Self-pay | Admitting: Family Medicine

## 2022-11-30 ENCOUNTER — Ambulatory Visit: Payer: BC Managed Care – PPO | Admitting: Family Medicine

## 2022-11-30 VITALS — BP 120/80 | HR 78 | Temp 97.9°F | Resp 18 | Ht 68.0 in | Wt 175.0 lb

## 2022-11-30 DIAGNOSIS — Z130 Encounter for screening for diseases of the blood and blood-forming organs and certain disorders involving the immune mechanism: Secondary | ICD-10-CM

## 2022-11-30 DIAGNOSIS — Z125 Encounter for screening for malignant neoplasm of prostate: Secondary | ICD-10-CM

## 2022-11-30 DIAGNOSIS — E7800 Pure hypercholesterolemia, unspecified: Secondary | ICD-10-CM | POA: Diagnosis not present

## 2022-11-30 DIAGNOSIS — E1142 Type 2 diabetes mellitus with diabetic polyneuropathy: Secondary | ICD-10-CM | POA: Diagnosis not present

## 2022-11-30 DIAGNOSIS — M25552 Pain in left hip: Secondary | ICD-10-CM

## 2022-11-30 DIAGNOSIS — M25551 Pain in right hip: Secondary | ICD-10-CM

## 2022-11-30 DIAGNOSIS — F4321 Adjustment disorder with depressed mood: Secondary | ICD-10-CM | POA: Diagnosis not present

## 2022-11-30 MED ORDER — GABAPENTIN 100 MG PO CAPS
100.0000 mg | ORAL_CAPSULE | Freq: Three times a day (TID) | ORAL | 1 refills | Status: DC
Start: 2022-11-30 — End: 2023-05-27

## 2022-11-30 MED ORDER — VENLAFAXINE HCL ER 75 MG PO CP24: 75.0000 mg | ORAL_CAPSULE | Freq: Every day | ORAL | 3 refills | Status: DC

## 2022-12-01 ENCOUNTER — Encounter: Payer: Self-pay | Admitting: Family Medicine

## 2022-12-01 LAB — COMPREHENSIVE METABOLIC PANEL
ALT: 15 U/L (ref 0–53)
AST: 17 U/L (ref 0–37)
Albumin: 4.4 g/dL (ref 3.5–5.2)
Alkaline Phosphatase: 32 U/L — ABNORMAL LOW (ref 39–117)
BUN: 14 mg/dL (ref 6–23)
CO2: 26 mEq/L (ref 19–32)
Calcium: 9.5 mg/dL (ref 8.4–10.5)
Chloride: 105 mEq/L (ref 96–112)
Creatinine, Ser: 0.78 mg/dL (ref 0.40–1.50)
GFR: 93.32 mL/min (ref >60.00)
Glucose, Bld: 136 mg/dL — ABNORMAL HIGH (ref 70–99)
Potassium: 3.8 mEq/L (ref 3.5–5.1)
Sodium: 139 mEq/L (ref 135–145)
Total Bilirubin: 0.5 mg/dL (ref 0.2–1.2)
Total Protein: 7.1 g/dL (ref 6.0–8.3)

## 2022-12-01 LAB — CBC
HCT: 42.2 % (ref 39.0–52.0)
Hemoglobin: 13.9 g/dL (ref 13.0–17.0)
MCHC: 32.8 g/dL (ref 30.0–36.0)
MCV: 89.5 fl (ref 78.0–100.0)
Platelets: 172 10*3/uL (ref 150.0–400.0)
RBC: 4.72 Mil/uL (ref 4.22–5.81)
RDW: 12.7 % (ref 11.5–15.5)
WBC: 10.8 10*3/uL — ABNORMAL HIGH (ref 4.0–10.5)

## 2022-12-01 LAB — LIPID PANEL
Cholesterol: 142 mg/dL (ref 0–200)
HDL: 50.4 mg/dL (ref >39.00)
LDL Cholesterol: 68 mg/dL (ref 0–99)
NonHDL: 91.68
Total CHOL/HDL Ratio: 3
Triglycerides: 120 mg/dL (ref 0.0–149.0)
VLDL: 24 mg/dL (ref 0.0–40.0)

## 2022-12-01 LAB — PSA: PSA: 0.64 ng/mL (ref 0.10–4.00)

## 2022-12-01 LAB — MICROALBUMIN / CREATININE URINE RATIO
Creatinine,U: 127.8 mg/dL
Microalb Creat Ratio: 0.6 mg/g (ref 0.0–30.0)
Microalb, Ur: 0.8 mg/dL (ref 0.0–1.9)

## 2022-12-01 LAB — HEMOGLOBIN A1C: Hgb A1c MFr Bld: 6.4 % (ref 4.6–6.5)

## 2022-12-23 ENCOUNTER — Other Ambulatory Visit: Payer: Self-pay | Admitting: Family Medicine

## 2022-12-23 DIAGNOSIS — G4700 Insomnia, unspecified: Secondary | ICD-10-CM

## 2022-12-23 NOTE — Telephone Encounter (Signed)
Requesting: Xanax  Contract: N/A UDS: N/A Last Visit: 11/30/2022 Next Visit: N/A Last Refill: 10/19/2022  Please Advise

## 2023-01-02 ENCOUNTER — Encounter: Payer: Self-pay | Admitting: Family Medicine

## 2023-01-02 DIAGNOSIS — F330 Major depressive disorder, recurrent, mild: Secondary | ICD-10-CM

## 2023-01-03 MED ORDER — VENLAFAXINE HCL ER 150 MG PO CP24
150.0000 mg | ORAL_CAPSULE | Freq: Every day | ORAL | 3 refills | Status: DC
Start: 2023-01-03 — End: 2023-07-26

## 2023-01-10 ENCOUNTER — Other Ambulatory Visit: Payer: Self-pay | Admitting: Internal Medicine

## 2023-01-10 DIAGNOSIS — E1142 Type 2 diabetes mellitus with diabetic polyneuropathy: Secondary | ICD-10-CM

## 2023-01-12 ENCOUNTER — Other Ambulatory Visit: Payer: Self-pay | Admitting: Family Medicine

## 2023-01-12 DIAGNOSIS — G4700 Insomnia, unspecified: Secondary | ICD-10-CM

## 2023-01-28 ENCOUNTER — Ambulatory Visit
Admission: RE | Admit: 2023-01-28 | Discharge: 2023-01-28 | Disposition: A | Discharge status: Home / Self Care | Payer: BC Managed Care – PPO | Source: Ambulatory Visit | Attending: Family Medicine | Admitting: Family Medicine

## 2023-01-28 DIAGNOSIS — M25551 Pain in right hip: Secondary | ICD-10-CM

## 2023-01-31 ENCOUNTER — Other Ambulatory Visit: Payer: Self-pay | Admitting: Internal Medicine

## 2023-01-31 DIAGNOSIS — E1142 Type 2 diabetes mellitus with diabetic polyneuropathy: Secondary | ICD-10-CM

## 2023-02-02 ENCOUNTER — Other Ambulatory Visit: Payer: Self-pay | Admitting: Internal Medicine

## 2023-02-02 DIAGNOSIS — E7800 Pure hypercholesterolemia, unspecified: Secondary | ICD-10-CM

## 2023-02-07 ENCOUNTER — Encounter: Payer: Self-pay | Admitting: Family Medicine

## 2023-02-26 ENCOUNTER — Other Ambulatory Visit: Payer: Self-pay

## 2023-02-28 NOTE — Progress Notes (Unsigned)
[facility] [address] [place], [place] [address] [phone]  Date:  03/01/2023   Name:  Luis Peterson   DOB:  07-Jul-1955   MRN:  [id number]  PCP:  Pearline Cables, MD    Chief Complaint: No chief complaint on file.   History of Present Illness:  Luis Peterson is a 67 y.o. very pleasant male patient who presents with the following:  Patient seen today with concern of knee pain Most recent visit with myself was in July History of diabetes, coronary disease, hyperlipidemia, neuropathy.  His wife Corrie Dandy died this past spring after a long battle with cancer  In July we changed him over from sertraline to venlafaxine for persistent symptoms of depression, anxiety  It was good to see you again today Flu shot COVID-19 Eye exam  Lab Results  Component Value Date   HGBA1C 6.4 11/30/2022     Patient Active Problem List   Diagnosis Date Noted   CAD (coronary artery disease) 06/06/2020   Degenerative joint disease of cervical spine    Depression    DM (diabetes mellitus), type 2 (HCC) 10/20/2018   DOE (dyspnea on exertion) 10/29/2017   Weight gain 09/28/2017   NASH (nonalcoholic steatohepatitis) 09/28/2017   Bilateral shoulder pain 08/13/2016   Rectal bleeding 02/03/2016   Wellness examination 09/26/2015   Screening for prostate cancer 09/03/2014   Low back pain 08/31/2013   Arthralgia 10/30/2009   FOOT PAIN 10/30/2009   Hearing loss 06/19/2008   GERD 06/19/2008   BEN LOC HYPERPLASIA PROS W/UR OBST & OTH LUTS 06/19/2008   Spondylosis 06/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 pylori test 07/1997    Past Medical History:  Diagnosis Date   Anxiety state 01/03/2007   Qualifier: Diagnosis of  By: Everardo All MD, Gregary Signs A    Arthralgia 10/30/2009   Qualifier:  Diagnosis of  By: Everardo All MD, Sean A    BEN LOC HYPERPLASIA PROS W/UR OBST & OTH LUTS 06/19/2008   Dr. [name] Fear   Bilateral shoulder pain 08/13/2016   CAD (coronary artery disease) 06/06/2020   Degenerative joint disease of cervical spine    Depression    DM (diabetes mellitus), type 2 (HCC) 10/20/2018   DOE (dyspnea on exertion) 10/29/2017   DYSPNEA 09/01/2007   Qualifier: Diagnosis of  By: Everardo All MD, Sean A    FOOT PAIN 10/30/2009   Qualifier: Diagnosis of  By: Everardo All MD, Sean A    GERD 06/19/2008   Qualifier: Diagnosis of  By: Everardo All MD, Sean A    Hearing loss 06/19/2008   Qualifier: Diagnosis of   By: Everardo All MD, Cleophas Dunker     IMO SNOMED Dx Update Oct 2024     HYPERCHOLESTEROLEMIA 09/01/2007   Qualifier: Diagnosis of  By: Everardo All MD, Sean A    Hyperglycemia 09/01/2007   Qualifier: Diagnosis of  By: Everardo All MD, Sean A    INSOMNIA 06/19/2008   Qualifier: Diagnosis of  By: Everardo All MD, Sean A    Low back pain 08/31/2013   NASH (nonalcoholic steatohepatitis) 09/28/2017   Positive H. 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   Qualifier: Diagnosis of  By: Everardo All MD, Sean A    Rectal bleeding 02/03/2016   Screening for prostate cancer 09/03/2014   Spondylosis 06/19/2008  Qualifier: Diagnosis of   By: Everardo All MD, Cleophas Dunker     IMO SNOMED Dx Update Oct 2024     Weight gain 09/28/2017   Wellness examination 09/26/2015    Past Surgical History:  Procedure Laterality Date   COLONOSCOPY     ELECTROCARDIOGRAM  05/28/2006   POLYPECTOMY     Stress Cardiolite  08/22/2003   TYMPANOPLASTY  03/1996   Right    Social History   Tobacco Use   Smoking status: Former    Types: Cigarettes    Passive exposure: Never   Smokeless tobacco: Never  Vaping Use   Vaping status: Never Used  Substance Use Topics   Alcohol use: Yes    Comment: social   Drug use: No    Family History  Problem Relation Age of Onset   Stomach cancer Mother    Heart disease Mother    Gastric cancer  Father        died age 52   Stroke Father    Colon cancer Sister 34   Other Brother        prostate issues, x 2 brothers   Rectal cancer Neg Hx    Crohn's disease Neg Hx    Esophageal cancer Neg Hx     Allergies  Allergen Reactions   Zocor [Simvastatin]     REACTION: Rash    Medication list has been reviewed and updated.  Current Outpatient Medications on File Prior to Visit  Medication Sig Dispense Refill   albuterol (VENTOLIN HFA) 108 (90 Base) MCG/ACT inhaler Inhale 1-2 puffs into the lungs every 6 (six) hours as needed. 8 g 0   ALPRAZolam (XANAX) 0.5 MG tablet TAKE 1 TABLET(0.5 MG) BY MOUTH THREE TIMES DAILY AS NEEDED FOR ANXIETY 90 tablet 1   atorvastatin (LIPITOR) 20 MG tablet TAKE 1 TABLET BY MOUTH DAILY AT 6 PM 90 tablet 1   blood glucose meter kit and supplies KIT Dispense based on patient and insurance preference. Use once daily.  Dx E11.9 1 each 2   CLOMID 50 MG tablet Take 25 mg by mouth daily.     dutasteride (AVODART) 0.5 MG capsule Take 1 capsule (0.5 mg total) by mouth daily. 90 capsule 1   Eszopiclone 3 MG TABS TAKE 1 TABLET BY MOUTH AT BEDTIME AS NEEDED FOR INSOMNIA 90 tablet 0   fluticasone (FLONASE) 50 MCG/ACT nasal spray Place 2 sprays into both nostrils daily. 16 g 0   gabapentin (NEURONTIN) 100 MG capsule Take 1 capsule (100 mg total) by mouth 3 (three) times daily. 270 capsule 1   metFORMIN (GLUCOPHAGE) 500 MG tablet Take 1 tablet (500 mg total) by mouth 2 (two) times daily with a meal. 180 tablet 1   Multiple Vitamins-Minerals (MULTIVITAMIN MEN 50+ PO) Take 1 tablet by mouth daily.     omeprazole (PRILOSEC) 20 MG capsule TAKE ONE CAPSULE BY MOUTH AS NEEDED 90 capsule 1   silodosin (RAPAFLO) 8 MG CAPS capsule Take 8 mg by mouth daily.     venlafaxine XR (EFFEXOR-XR) 150 MG 24 hr capsule Take 1 capsule (150 mg total) by mouth daily with breakfast. 90 capsule 3   No current facility-administered medications on file prior to visit.    Review of  Systems:  As per HPI- otherwise negative.   Physical Examination: There were no vitals filed for this visit. There were no vitals filed for this visit. There is no height or weight on file to calculate BMI. Ideal Body Weight:  GEN: no acute distress. HEENT: Atraumatic, Normocephalic.  Ears and Nose: No external deformity. CV: RRR, No M/G/R. No JVD. No thrill. No extra heart sounds. PULM: CTA B, no wheezes, crackles, rhonchi. No retractions. No resp. distress. No accessory muscle use. ABD: S, NT, ND, +BS. No rebound. No HSM. EXTR: No c/c/e PSYCH: Normally interactive. Conversant.    Assessment and Plan: ***  Signed Abbe Amsterdam, MD

## 2023-03-01 ENCOUNTER — Other Ambulatory Visit: Payer: Self-pay | Admitting: Family Medicine

## 2023-03-01 ENCOUNTER — Other Ambulatory Visit: Payer: Self-pay | Admitting: Internal Medicine

## 2023-03-01 ENCOUNTER — Ambulatory Visit: Payer: BC Managed Care – PPO | Attending: Cardiology | Admitting: Cardiology

## 2023-03-01 ENCOUNTER — Ambulatory Visit: Payer: BC Managed Care – PPO | Admitting: Family Medicine

## 2023-03-01 ENCOUNTER — Encounter: Payer: Self-pay | Admitting: Cardiology

## 2023-03-01 ENCOUNTER — Ambulatory Visit (HOSPITAL_BASED_OUTPATIENT_CLINIC_OR_DEPARTMENT_OTHER)
Admission: RE | Admit: 2023-03-01 | Discharge: 2023-03-01 | Disposition: A | Discharge status: Home / Self Care | Payer: BC Managed Care – PPO | Source: Ambulatory Visit | Attending: Family Medicine | Admitting: Family Medicine

## 2023-03-01 VITALS — BP 124/80 | HR 67 | Temp 97.8°F | Resp 18 | Ht 68.0 in | Wt 183.4 lb

## 2023-03-01 VITALS — BP 110/64 | HR 78 | Ht 69.6 in | Wt 184.0 lb

## 2023-03-01 DIAGNOSIS — E1142 Type 2 diabetes mellitus with diabetic polyneuropathy: Secondary | ICD-10-CM

## 2023-03-01 DIAGNOSIS — R079 Chest pain, unspecified: Secondary | ICD-10-CM

## 2023-03-01 DIAGNOSIS — R7303 Prediabetes: Secondary | ICD-10-CM | POA: Diagnosis not present

## 2023-03-01 DIAGNOSIS — G4700 Insomnia, unspecified: Secondary | ICD-10-CM

## 2023-03-01 DIAGNOSIS — Z23 Encounter for immunization: Secondary | ICD-10-CM | POA: Diagnosis not present

## 2023-03-01 DIAGNOSIS — E088 Diabetes mellitus due to underlying condition with unspecified complications: Secondary | ICD-10-CM | POA: Diagnosis not present

## 2023-03-01 DIAGNOSIS — M25562 Pain in left knee: Secondary | ICD-10-CM | POA: Insufficient documentation

## 2023-03-01 DIAGNOSIS — I2510 Atherosclerotic heart disease of native coronary artery without angina pectoris: Secondary | ICD-10-CM | POA: Diagnosis not present

## 2023-03-01 DIAGNOSIS — E782 Mixed hyperlipidemia: Secondary | ICD-10-CM | POA: Insufficient documentation

## 2023-03-01 HISTORY — DX: Mixed hyperlipidemia: E78.2

## 2023-03-01 HISTORY — DX: Chest pain, unspecified: R07.9

## 2023-03-01 MED ORDER — DICLOFENAC SODIUM 75 MG PO TBEC
75.0000 mg | DELAYED_RELEASE_TABLET | Freq: Two times a day (BID) | ORAL | 0 refills | Status: DC
Start: 2023-03-01 — End: 2023-03-31

## 2023-03-01 MED ORDER — NITROGLYCERIN 0.4 MG SL SUBL
0.4000 mg | SUBLINGUAL_TABLET | SUBLINGUAL | 6 refills | Status: DC | PRN
Start: 2023-03-01 — End: 2024-01-04

## 2023-03-01 NOTE — Patient Instructions (Signed)
It was good to see you today Flu shot today We will get x-rays of your knee  Try voltaren as needed for knee pain.  You might also try ice, and a compression sleeve If not getting better we may op to have you see orthopedics about your knee!

## 2023-03-01 NOTE — Patient Instructions (Addendum)
Medication Instructions:  Your physician has recommended you make the following change in your medication:   Use nitroglycerin 1 tablet placed under the tongue at the first sign of chest pain or an angina attack. 1 tablet may be used every 5 minutes as needed, for up to 15 minutes. Do not take more than 3 tablets in 15 minutes. If pain persist call 911 or go to the nearest ED.   *If you need a refill on your cardiac medications before your next appointment, please call your pharmacy*   Lab Work: None ordered If you have labs (blood work) drawn today and your tests are completely normal, you will receive your results only by: MyChart Message (if you have MyChart) OR A paper copy in the mail If you have any lab test that is abnormal or we need to change your treatment, we will call you to review the results.   Testing/Procedures: You are scheduled for a Myocardial Perfusion Imaging Study.  Please arrive 15 minutes prior to your appointment time for registration and insurance purposes.  The test will take approximately 3 to 4 hours to complete; you may bring reading material.  If someone comes with you to your appointment, they will need to remain in the main lobby due to limited space in the testing area.   How to prepare for your Myocardial Perfusion Test: Do not eat or drink 3 hours prior to your test, except you may have water. Do not consume products containing caffeine (regular or decaffeinated) 12 hours prior to your test. (ex: coffee, chocolate, sodas, tea). Do bring a list of your current medications with you.  If not listed below, you may take your medications as normal. Do wear comfortable clothes (no dresses or overalls) and walking shoes, tennis shoes preferred (No heels or open toe shoes are allowed). Do NOT wear cologne, perfume, aftershave, or lotions (deodorant is allowed). If these instructions are not followed, your test will have to be rescheduled.  If you cannot keep  your appointment, please provide 24 hours notification to the Nuclear Lab, to avoid a possible $50 charge to your account.  Follow-Up: At [facility], you and your health needs are our priority.  As part of our continuing mission to provide you with exceptional heart care, we have created designated Provider Care Teams.  These Care Teams include your primary Cardiologist (physician) and Advanced Practice Providers (APPs -  Physician Assistants and Nurse Practitioners) who all work together to provide you with the care you need, when you need it.  We recommend signing up for the patient portal called "MyChart".  Sign up information is provided on this After Visit Summary.  MyChart is used to connect with patients for Virtual Visits (Telemedicine).  Patients are able to view lab/test results, encounter notes, upcoming appointments, etc.  Non-urgent messages can be sent to your provider as well.   To learn more about what you can do with MyChart, go to [email or web address].    Your next appointment:   9 month(s)  Provider:   Belva Crome, MD   Other Instructions  Cardiac Nuclear Scan A cardiac nuclear scan is a test that is done to check the flow of blood to your heart. It is done when you are resting and when you are exercising. The test looks for problems such as: Not enough blood reaching a portion of the heart. The heart muscle not working as it should. You may need this test if you have: Heart disease.  Lab results that are not normal. Had heart surgery or a balloon procedure to open up blocked arteries (angioplasty) or a small mesh tube (stent). Chest pain. Shortness of breath. Had a heart attack. In this test, a special dye (tracer) is put into your bloodstream. The tracer will travel to your heart. A camera will then take pictures of your heart to see how the tracer moves through your heart. This test is usually done at a hospital and takes 2-4 hours. Tell a doctor  about: Any allergies you have. All medicines you are taking, including vitamins, herbs, eye drops, creams, and over-the-counter medicines. Any bleeding problems you have. Any surgeries you have had. Any medical conditions you have. Whether you are pregnant or may be pregnant. Any history of asthma or long-term (chronic) lung disease. Any history of heart rhythm disorders or heart valve conditions. What are the risks? Your doctor will talk with you about risks. These may include: Serious chest pain and heart attack. This is only a risk if the stress portion of the test is done. Fast or uneven heartbeats (palpitations). A feeling of warmth in your chest. This feeling usually does not last long. Allergic reaction to the tracer. Shortness of breath or trouble breathing. What happens before the test? Ask your doctor about changing or stopping your normal medicines. Follow instructions from your doctor about what you cannot eat or drink. Remove your jewelry on the day of the test. Ask your doctor if you need to avoid nicotine or caffeine. What happens during the test? An IV tube will be inserted into one of your veins. Your doctor will give you a small amount of tracer through the IV tube. You will wait for 20-40 minutes while the tracer moves through your bloodstream. Your heart will be monitored with an electrocardiogram (ECG). You will lie down on an exam table. Pictures of your heart will be taken for about 15-20 minutes. You may also have a stress test. For this test, one of these things may be done: You will be asked to exercise on a treadmill or a stationary bike. You will be given medicines that will make your heart work harder. This is done if you are unable to exercise. When blood flow to your heart has peaked, a tracer will again be given through the IV tube. After 20-40 minutes, you will get back on the exam table. More pictures will be taken of your heart. Depending on the  tracer that is used, more pictures may need to be taken 3-4 hours later. Your IV tube will be removed when the test is over. The test may vary among doctors and hospitals. What happens after the test? Ask your doctor: Whether you can return to your normal schedule, including diet, activities, travel, and medicines. Whether you should drink more fluids. This will help to remove the tracer from your body. Ask your doctor, or the department that is doing the test: When will my results be ready? How will I get my results? What are my treatment options? What other tests do I need? What are my next steps? This information is not intended to replace advice given to you by your health care provider. Make sure you discuss any questions you have with your health care provider. Document Revised: 09/23/2021 Document Reviewed: 09/23/2021 Elsevier Patient Education  2023 [employer].

## 2023-03-01 NOTE — Progress Notes (Signed)
Cardiology Office Note:    Date:  03/01/2023   ID:  Luis Peterson, DOB 1956-04-02, MRN [id number]  PCP:  Pearline Cables, MD  Cardiologist:  Garwin Brothers, MD   Referring MD: Pearline Cables, MD    ASSESSMENT:    1. Coronary artery disease involving native coronary artery of native heart without angina pectoris   2. Chest pain of uncertain etiology   3. Diabetes mellitus due to underlying condition with unspecified complications (HCC)   4. Mixed dyslipidemia    PLAN:    In order of problems listed above:  Coronary artery calcification: Chest pain: Patient's symptoms are not typical for coronary etiology but in view of risk factors I will do a exercise stress Cardiolite.  He is agreeable.  He lets me know that he has some knee issues and may opt for chemical stress test when he gets there.  He will let the technologist know as to how he feels about this testing whether it will be exercise or chemical when he gets there.  I respect his wishes.  Sublingual nitroglycerin prescription was sent, its protocol and 911 protocol explained and the patient vocalized understanding questions were answered to the patient's satisfaction Essential hypertension: Blood pressure stable and diet was emphasized. Mixed dyslipidemia: On lipid-lowering medications followed by primary care. Diabetes mellitus: Followed by primary care.  Diet and exercise stressed.  Told him to initiate an exercise program once the results of the stress test were available and conveyed to him.  He understands. Patient will be seen in follow-up appointment in 6 months or earlier if the patient has any concerns.    Medication Adjustments/Labs and Tests Ordered: Current medicines are reviewed at length with the patient today.  Concerns regarding medicines are outlined above.  No orders of the defined types were placed in this encounter.  No orders of the defined types were placed in this encounter.    No chief  complaint on file.    History of Present Illness:    Luis Peterson is a 67 y.o. male.  Patient has past medical history of coronary artery calcifications, essential hypertension, mixed dyslipidemia and diabetes mellitus.  Unfortunately he lost his wife and his sister in the past few months and he is in the grieving process.  He has talked to his primary care physician about it and is receiving counseling and treatment.  No orthopnea or PND.  At the time of my evaluation, the patient is alert awake oriented and in no distress.  He occasionally gives history of chest discomfort not related to exertion.  Past Medical History:  Diagnosis Date   Anxiety state 01/03/2007   Qualifier: Diagnosis of  By: Everardo All MD, Gregary Signs A    Arthralgia 10/30/2009   Qualifier: Diagnosis of  By: Everardo All MD, Sean A    BEN LOC HYPERPLASIA PROS W/UR OBST & OTH LUTS 06/19/2008   Dr. [name] Fear   Bilateral shoulder pain 08/13/2016   CAD (coronary artery disease) 06/06/2020   Degenerative joint disease of cervical spine    Depression    DM (diabetes mellitus), type 2 (HCC) 10/20/2018   DOE (dyspnea on exertion) 10/29/2017   DYSPNEA 09/01/2007   Qualifier: Diagnosis of  By: Everardo All MD, Sean A    FOOT PAIN 10/30/2009   Qualifier: Diagnosis of  By: Everardo All MD, Sean A    GERD 06/19/2008   Qualifier: Diagnosis of  By: Everardo All MD, Sean A    Hearing loss 06/19/2008  Qualifier: Diagnosis of   By: Everardo All MD, Cleophas Dunker     IMO SNOMED Dx Update Oct 2024     HYPERCHOLESTEROLEMIA 09/01/2007   Qualifier: Diagnosis of  By: Everardo All MD, Gregary Signs A    Hyperglycemia 09/01/2007   Qualifier: Diagnosis of  By: Everardo All MD, Sean A    INSOMNIA 06/19/2008   Qualifier: Diagnosis of  By: Everardo All MD, Sean A    Low back pain 08/31/2013   NASH (nonalcoholic steatohepatitis) 09/28/2017   Positive H. 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   Qualifier: Diagnosis of  By: Everardo All MD, Sean A    Rectal bleeding 02/03/2016   Screening for  prostate cancer 09/03/2014   Spondylosis 06/19/2008   Qualifier: Diagnosis of   By: Everardo All MD, Cleophas Dunker     IMO SNOMED Dx Update Oct 2024     Weight gain 09/28/2017   Wellness examination 09/26/2015    Past Surgical History:  Procedure Laterality Date   COLONOSCOPY     ELECTROCARDIOGRAM  05/28/2006   POLYPECTOMY     Stress Cardiolite  08/22/2003   TYMPANOPLASTY  03/1996   Right    Current Medications: Current Meds  Medication Sig   albuterol (VENTOLIN HFA) 108 (90 Base) MCG/ACT inhaler Inhale 1-2 puffs into the lungs every 6 (six) hours as needed.   ALPRAZolam (XANAX) 0.5 MG tablet TAKE 1 TABLET(0.5 MG) BY MOUTH THREE TIMES DAILY AS NEEDED FOR ANXIETY   atorvastatin (LIPITOR) 20 MG tablet TAKE 1 TABLET BY MOUTH DAILY AT 6 PM   blood glucose meter kit and supplies KIT Dispense based on patient and insurance preference. Use once daily.  Dx E11.9   CLOMID 50 MG tablet Take 25 mg by mouth daily.   dutasteride (AVODART) 0.5 MG capsule Take 1 capsule (0.5 mg total) by mouth daily.   Eszopiclone 3 MG TABS TAKE 1 TABLET BY MOUTH AT BEDTIME AS NEEDED FOR INSOMNIA   fluticasone (FLONASE) 50 MCG/ACT nasal spray Place 2 sprays into both nostrils daily.   gabapentin (NEURONTIN) 100 MG capsule Take 1 capsule (100 mg total) by mouth 3 (three) times daily.   metFORMIN (GLUCOPHAGE) 500 MG tablet Take 1 tablet (500 mg total) by mouth 2 (two) times daily with a meal.   Multiple Vitamins-Minerals (MULTIVITAMIN MEN 50+ PO) Take 1 tablet by mouth daily.   omeprazole (PRILOSEC) 20 MG capsule TAKE ONE CAPSULE BY MOUTH AS NEEDED   silodosin (RAPAFLO) 8 MG CAPS capsule Take 8 mg by mouth daily.   venlafaxine XR (EFFEXOR-XR) 150 MG 24 hr capsule Take 1 capsule (150 mg total) by mouth daily with breakfast.     Allergies:   Zocor [simvastatin]   Social History   Socioeconomic History   Marital status: Married    Spouse name: Not on file   Number of children: 1   Years of education: Not on file    Highest education level: Doctorate  Occupational History   Occupation: Automotive engineer Professor     Employer: [employer]   Tobacco Use   Smoking status: Former    Types: Cigarettes    Passive exposure: Never   Smokeless tobacco: Never  Vaping Use   Vaping status: Never Used  Substance and Sexual Activity   Alcohol use: Yes    Comment: social   Drug use: No   Sexual activity: Not on file  Other Topics Concern   Not on file  Social History Narrative   Not on file   Social Determinants of Health  Financial Resource Strain: Low Risk  (02/28/2023)   Overall Financial Resource Strain (CARDIA)    Difficulty of Paying Living Expenses: Not hard at all  Food Insecurity: No Food Insecurity (02/28/2023)   Hunger Vital Sign    Worried About Running Out of Food in the Last Year: Never true    Ran Out of Food in the Last Year: Never true  Transportation Needs: No Transportation Needs (02/28/2023)   PRAPARE - Administrator, Civil Service (Medical): No    Lack of Transportation (Non-Medical): No  Physical Activity: Insufficiently Active (02/28/2023)   Exercise Vital Sign    Days of Exercise per Week: 2 days    Minutes of Exercise per Session: 20 min  Stress: Patient Declined (02/28/2023)   [employer] of Occupational Health - Occupational Stress Questionnaire    Feeling of Stress : Patient declined  Social Connections: Unknown (02/28/2023)   Social Connection and Isolation Panel [NHANES]    Frequency of Communication with Friends and Family: Twice a week    Frequency of Social Gatherings with Friends and Family: Patient declined    Attends Religious Services: Never    Database administrator or Organizations: No    Attends Engineer, structural: 1 to 4 times per year    Marital Status: Widowed     Family History: The patient's family history includes Colon cancer (age of onset: 108) in his sister; Gastric cancer in his father; Heart disease in his mother; Other  in his brother; Stomach cancer in his mother; Stroke in his father. There is no history of Rectal cancer, Crohn's disease, or Esophageal cancer.  ROS:   Please see the history of present illness.    All other systems reviewed and are negative.  EKGs/Labs/Other Studies Reviewed:    The following studies were reviewed today: I discussed my findings with the patient at length   Recent Labs: 11/30/2022: ALT 15; BUN 14; Creatinine, Ser 0.78; Hemoglobin 13.9; Platelets 172.0; Potassium 3.8; Sodium 139  Recent Lipid Panel    Component Value Date/Time   CHOL 142 11/30/2022 1614   CHOL 157 03/27/2021 1019   TRIG 120.0 11/30/2022 1614   TRIG 160 (H) 05/21/2006 0911   HDL 50.40 11/30/2022 1614   HDL 54 03/27/2021 1019   CHOLHDL 3 11/30/2022 1614   VLDL 24.0 11/30/2022 1614   LDLCALC 68 11/30/2022 1614   LDLCALC 73 03/27/2021 1019   LDLCALC 76 02/21/2020 1349   LDLDIRECT 81.0 10/19/2018 1024    Physical Exam:    VS:  BP 110/64   Pulse 78   Ht 5' 9.6" (1.768 m)   Wt 184 lb (83.5 kg)   SpO2 98%   BMI 26.71 kg/m     Wt Readings from Last 3 Encounters:  03/01/23 184 lb (83.5 kg)  11/30/22 175 lb (79.4 kg)  06/15/22 171 lb 12.8 oz (77.9 kg)     GEN: Patient is in no acute distress HEENT: Normal NECK: No JVD; No carotid bruits LYMPHATICS: No lymphadenopathy CARDIAC: Hear sounds regular, 2/6 systolic murmur at the apex. RESPIRATORY:  Clear to auscultation without rales, wheezing or rhonchi  ABDOMEN: Soft, non-tender, non-distended MUSCULOSKELETAL:  No edema; No deformity  SKIN: Warm and dry NEUROLOGIC:  Alert and oriented x 3 PSYCHIATRIC:  Normal affect   Signed, Garwin Brothers, MD  03/01/2023 10:46 AM    [facility]

## 2023-03-02 ENCOUNTER — Encounter: Payer: Self-pay | Admitting: Family Medicine

## 2023-03-02 LAB — HEMOGLOBIN A1C: Hgb A1c MFr Bld: 6.1 % (ref 4.6–6.5)

## 2023-03-03 ENCOUNTER — Other Ambulatory Visit: Payer: Self-pay | Admitting: Internal Medicine

## 2023-03-03 DIAGNOSIS — E1142 Type 2 diabetes mellitus with diabetic polyneuropathy: Secondary | ICD-10-CM

## 2023-03-05 ENCOUNTER — Telehealth (HOSPITAL_COMMUNITY): Payer: Self-pay | Admitting: *Deleted

## 2023-03-05 NOTE — Telephone Encounter (Signed)
Left detailed instructions for MPI study. 

## 2023-03-06 MED ORDER — VENLAFAXINE HCL ER 75 MG PO CP24: 75.0000 mg | ORAL_CAPSULE | Freq: Every day | ORAL | 3 refills | Status: DC

## 2023-03-06 NOTE — Addendum Note (Signed)
Addended by: Abbe Amsterdam C on: 03/06/2023 08:05 AM   Modules accepted: Orders

## 2023-03-11 ENCOUNTER — Ambulatory Visit (HOSPITAL_COMMUNITY): Payer: BC Managed Care – PPO

## 2023-03-12 ENCOUNTER — Encounter: Payer: Self-pay | Admitting: Family Medicine

## 2023-03-12 DIAGNOSIS — E1142 Type 2 diabetes mellitus with diabetic polyneuropathy: Secondary | ICD-10-CM

## 2023-03-15 MED ORDER — METFORMIN HCL 500 MG PO TABS
500.0000 mg | ORAL_TABLET | Freq: Two times a day (BID) | ORAL | 3 refills | Status: DC
Start: 2023-03-15 — End: 2024-01-24

## 2023-03-18 ENCOUNTER — Ambulatory Visit (HOSPITAL_COMMUNITY): Payer: BC Managed Care – PPO | Attending: Cardiology

## 2023-03-18 ENCOUNTER — Other Ambulatory Visit: Payer: Self-pay | Admitting: Cardiology

## 2023-03-18 VITALS — Ht 68.0 in | Wt 183.0 lb

## 2023-03-18 DIAGNOSIS — I2510 Atherosclerotic heart disease of native coronary artery without angina pectoris: Secondary | ICD-10-CM | POA: Diagnosis present

## 2023-03-18 DIAGNOSIS — R079 Chest pain, unspecified: Secondary | ICD-10-CM

## 2023-03-18 DIAGNOSIS — E7800 Pure hypercholesterolemia, unspecified: Secondary | ICD-10-CM | POA: Insufficient documentation

## 2023-03-18 DIAGNOSIS — E782 Mixed hyperlipidemia: Secondary | ICD-10-CM | POA: Diagnosis present

## 2023-03-18 LAB — MYOCARDIAL PERFUSION IMAGING
LV dias vol: 78 mL (ref 62–150)
LV sys vol: 28 mL
Nuc Stress EF: 64 %
Peak HR: 93 {beats}/min
Peak HR: 93 {beats}/min
Rest HR: 65 {beats}/min
Rest HR: 65 {beats}/min
Rest Nuclear Isotope Dose: 8.5 mCi
Rest Nuclear Isotope Dose: 8.5 mCi
SDS: 0
SRS: 0
SSS: 0
ST Depression (mm): 0 mm
Stress Nuclear Isotope Dose: 25.8 mCi
Stress Nuclear Isotope Dose: 25.8 mCi
TID: 0.97

## 2023-03-18 MED ORDER — TECHNETIUM TC 99M TETROFOSMIN IV KIT
8.5000 | PACK | Freq: Once | INTRAVENOUS | Status: AC | PRN
  Administered 2023-03-18: 8.5 via INTRAVENOUS

## 2023-03-18 MED ORDER — REGADENOSON 0.4 MG/5ML IV SOLN
0.4000 mg | Freq: Once | INTRAVENOUS | Status: AC
  Administered 2023-03-18: 0.4 mg via INTRAVENOUS

## 2023-03-18 MED ORDER — TECHNETIUM TC 99M TETROFOSMIN IV KIT
25.8000 | PACK | Freq: Once | INTRAVENOUS | Status: AC | PRN
  Administered 2023-03-18: 25.8 via INTRAVENOUS

## 2023-03-24 ENCOUNTER — Encounter: Payer: Self-pay | Admitting: Family Medicine

## 2023-03-24 DIAGNOSIS — M25562 Pain in left knee: Secondary | ICD-10-CM

## 2023-03-31 ENCOUNTER — Encounter: Payer: Self-pay | Admitting: Sports Medicine

## 2023-03-31 ENCOUNTER — Ambulatory Visit: Payer: BC Managed Care – PPO | Admitting: Sports Medicine

## 2023-03-31 DIAGNOSIS — M25562 Pain in left knee: Secondary | ICD-10-CM | POA: Diagnosis not present

## 2023-03-31 MED ORDER — DICLOFENAC SODIUM 75 MG PO TBEC
75.0000 mg | DELAYED_RELEASE_TABLET | Freq: Two times a day (BID) | ORAL | 0 refills | Status: DC
Start: 2023-03-31 — End: 2023-04-26

## 2023-03-31 NOTE — Progress Notes (Signed)
   Subjective:    Patient ID: Luis Peterson, male    DOB: 06/05/1955, 67 y.o.   MRN: [id number]  HPI chief complaint: Left knee pain  Patient is a very pleasant 67 year old male that presents today with 3 weeks of left knee pain.  He recently returned to exercise after a period of inactivity.  Shortly afterwards, he began to experience some diffuse left knee pain as well as an inability to completely flex the knee.  He was seen by his primary care physician who ordered x-rays of the knee.  Those x-rays show some mild medial compartmental DJD as well as a joint effusion.  He was placed on diclofenac which has been helpful.  He denies any problems with the knee in the past.  No mechanical symptoms.  No prior knee surgeries.  Past medical history reviewed Medications reviewed Allergies reviewed  Review of Systems As above    Objective:   Physical Exam  Well-developed, well-nourished.  No acute distress  Left knee: Good range of motion.  No obvious effusion on today's exam.  There is some tenderness to palpation along the medial and lateral joint lines.  Positive medial Thessaly's.  Pain but no popping with McMurray's.  Neurovascularly intact distally.  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  He will then wean to taking it only on an as-needed basis.  We will couple that with a compression sleeve with activity as well as home exercises that focus on VMO strengthening.  He will give that plan of treatment approximately 3 weeks.  If symptoms are not improving then consider cortisone injection at that time.  Follow-up as needed.  This note was dictated using Dragon naturally speaking software and may contain errors in syntax, spelling, or content which have not been identified prior to signing this note.

## 2023-04-10 ENCOUNTER — Other Ambulatory Visit: Payer: Self-pay | Admitting: Family Medicine

## 2023-04-10 DIAGNOSIS — G4700 Insomnia, unspecified: Secondary | ICD-10-CM

## 2023-04-25 ENCOUNTER — Other Ambulatory Visit: Payer: Self-pay | Admitting: Family Medicine

## 2023-04-25 DIAGNOSIS — M25562 Pain in left knee: Secondary | ICD-10-CM

## 2023-05-08 ENCOUNTER — Other Ambulatory Visit: Payer: Self-pay | Admitting: Internal Medicine

## 2023-05-08 DIAGNOSIS — F330 Major depressive disorder, recurrent, mild: Secondary | ICD-10-CM

## 2023-05-13 ENCOUNTER — Other Ambulatory Visit: Payer: Self-pay | Admitting: Family Medicine

## 2023-05-13 DIAGNOSIS — G4700 Insomnia, unspecified: Secondary | ICD-10-CM

## 2023-05-27 ENCOUNTER — Other Ambulatory Visit: Payer: Self-pay | Admitting: Family Medicine

## 2023-05-27 DIAGNOSIS — E1142 Type 2 diabetes mellitus with diabetic polyneuropathy: Secondary | ICD-10-CM

## 2023-06-09 ENCOUNTER — Other Ambulatory Visit: Payer: Self-pay | Admitting: Internal Medicine

## 2023-06-09 DIAGNOSIS — E7800 Pure hypercholesterolemia, unspecified: Secondary | ICD-10-CM

## 2023-06-28 ENCOUNTER — Other Ambulatory Visit: Payer: Self-pay | Admitting: Internal Medicine

## 2023-06-28 ENCOUNTER — Encounter: Payer: Self-pay | Admitting: Family Medicine

## 2023-06-28 DIAGNOSIS — E7800 Pure hypercholesterolemia, unspecified: Secondary | ICD-10-CM

## 2023-06-28 MED ORDER — ATORVASTATIN CALCIUM 20 MG PO TABS
20.0000 mg | ORAL_TABLET | Freq: Every day | ORAL | 1 refills | Status: DC
Start: 2023-06-28 — End: 2023-09-01

## 2023-07-11 ENCOUNTER — Other Ambulatory Visit: Payer: Self-pay | Admitting: Family Medicine

## 2023-07-11 DIAGNOSIS — M25562 Pain in left knee: Secondary | ICD-10-CM

## 2023-07-26 ENCOUNTER — Other Ambulatory Visit: Payer: Self-pay | Admitting: Family Medicine

## 2023-07-26 DIAGNOSIS — F330 Major depressive disorder, recurrent, mild: Secondary | ICD-10-CM

## 2023-07-26 MED ORDER — VENLAFAXINE HCL ER 150 MG PO CP24: 150.0000 mg | ORAL_CAPSULE | Freq: Every day | ORAL | 0 refills | Status: DC

## 2023-08-21 ENCOUNTER — Other Ambulatory Visit: Payer: Self-pay | Admitting: Family Medicine

## 2023-08-21 DIAGNOSIS — E1142 Type 2 diabetes mellitus with diabetic polyneuropathy: Secondary | ICD-10-CM

## 2023-08-22 ENCOUNTER — Other Ambulatory Visit: Payer: Self-pay | Admitting: Family Medicine

## 2023-08-22 DIAGNOSIS — G4700 Insomnia, unspecified: Secondary | ICD-10-CM

## 2023-08-23 ENCOUNTER — Encounter: Payer: Self-pay | Admitting: Family Medicine

## 2023-08-28 NOTE — Progress Notes (Unsigned)
 [facility] [address] [place], [place] [address] [phone]  Date:  09/01/2023   Name:  Luis Peterson   DOB:  Aug 14, 1955   MRN:  [id number]  PCP:  Kaylee Partridge, MD    Chief Complaint: No chief complaint on file.   History of Present Illness:  GARRETH Peterson is a 68 y.o. very pleasant male patient who presents with the following:  Patient seen today for follow-up.  Most recent visit with myself was in October when he was doing with knee pain History of diabetes, coronary disease, hyperlipidemia, neuropathy. His wife Luis Peterson died in 10/02/22 after a long battle with cancer  Last A1c showed good control of diabetes  Most recent COVID booster Foot exam can be updated Eye exam  Albuterol  as needed Xanax  as needed Lipitor 20 Clomid? Avodart  Lunesta  Gabapentin  100 3 times daily Effexor  225 total Lab Results  Component Value Date   HGBA1C 6.1 03/01/2023    Patient Active Problem List   Diagnosis Date Noted   Chest pain of uncertain etiology 03/01/2023   Diabetes mellitus due to underlying condition with unspecified complications (HCC) 03/01/2023   Mixed dyslipidemia 03/01/2023   CAD (coronary artery disease) 06/06/2020   Degenerative joint disease of cervical spine    Depression    DM (diabetes mellitus), type 2 (HCC) 10/20/2018   DOE (dyspnea on exertion) 10/29/2017   Weight gain 09/28/2017   NASH (nonalcoholic steatohepatitis) 09/28/2017   Bilateral shoulder pain 08/13/2016   Rectal bleeding 02/03/2016   Wellness examination 09/26/2015   Screening for prostate cancer 09/03/2014   Low back pain 08/31/2013   Arthralgia 10/30/2009   FOOT PAIN 10/30/2009   Hearing loss 06/19/2008   GERD 06/19/2008   BEN LOC HYPERPLASIA PROS W/UR OBST & OTH LUTS 06/19/2008   Spondylosis 06/19/2008   INSOMNIA 06/19/2008   POSITIVE PPD 06/19/2008   HYPERCHOLESTEROLEMIA 09/01/2007   DYSPNEA 09/01/2007    Hyperglycemia 09/01/2007   Anxiety state 01/03/2007   Positive H. pylori test 07/1997    Past Medical History:  Diagnosis Date   Anxiety state 01/03/2007   Qualifier: Diagnosis of  By: [place] Hacker MD, Kaaren Ora A    Arthralgia 10/30/2009   Qualifier: Diagnosis of  By: [place] Hacker MD, Sean A    BEN LOC HYPERPLASIA PROS W/UR OBST & OTH LUTS 06/19/2008   Dr. Ula Gambler   Bilateral shoulder pain 08/13/2016   CAD (coronary artery disease) 06/06/2020   Degenerative joint disease of cervical spine    Depression    DM (diabetes mellitus), type 2 (HCC) 10/20/2018   DOE (dyspnea on exertion) 10/29/2017   DYSPNEA 09/01/2007   Qualifier: Diagnosis of  By: [place] Hacker MD, Sean A    FOOT PAIN 10/30/2009   Qualifier: Diagnosis of  By: [place] Hacker MD, Sean A    GERD 06/19/2008   Qualifier: Diagnosis of  By: [place] Hacker MD, Sean A    Hearing loss 06/19/2008   Qualifier: Diagnosis of   By: [place] Hacker MD, Leota Randy     IMO SNOMED Dx Update Oct 2024     HYPERCHOLESTEROLEMIA 09/01/2007   Qualifier: Diagnosis of  By: [place] Hacker MD, Sean A    Hyperglycemia 09/01/2007   Qualifier: Diagnosis of  By: [place] Hacker MD, Sean A    INSOMNIA 06/19/2008   Qualifier: Diagnosis of  By: [place] Hacker MD, Sean A    Low back pain 08/31/2013   NASH (nonalcoholic steatohepatitis) 09/28/2017   Positive H. pylori  test 07/1997   POSITIVE PPD 06/19/2008   Qualifier: Diagnosis of  By: [place] Hacker MD, Sean A    Rectal bleeding 02/03/2016   Screening for prostate cancer 09/03/2014   Spondylosis 06/19/2008   Qualifier: Diagnosis of   By: [place] Hacker MD, Leota Randy     IMO SNOMED Dx Update Oct 2024     Weight gain 09/28/2017   Wellness examination 09/26/2015    Past Surgical History:  Procedure Laterality Date   COLONOSCOPY     ELECTROCARDIOGRAM  05/28/2006   POLYPECTOMY     Stress Cardiolite  08/22/2003   TYMPANOPLASTY  03/1996   Right    Social History   Tobacco Use   Smoking status: Former    Types: Cigarettes    Passive exposure: Never   Smokeless tobacco:  Never  Vaping Use   Vaping status: Never Used  Substance Use Topics   Alcohol use: Yes    Comment: social   Drug use: No    Family History  Problem Relation Age of Onset   Stomach cancer Mother    Heart disease Mother    Gastric cancer Father        died age 76   Stroke Father    Colon cancer Sister 67   Other Brother        prostate issues, x 2 brothers   Rectal cancer Neg Hx    Crohn's disease Neg Hx    Esophageal cancer Neg Hx     Allergies  Allergen Reactions   Zocor [Simvastatin]     REACTION: Rash    Medication list has been reviewed and updated.  Current Outpatient Medications on File Prior to Visit  Medication Sig Dispense Refill   albuterol  (VENTOLIN  HFA) 108 (90 Base) MCG/ACT inhaler Inhale 1-2 puffs into the lungs every 6 (six) hours as needed. 8 g 0   ALPRAZolam  (XANAX ) 0.5 MG tablet TAKE 1 TABLET(0.5 MG) BY MOUTH THREE TIMES DAILY AS NEEDED FOR ANXIETY 90 tablet 1   atorvastatin  (LIPITOR) 20 MG tablet Take 1 tablet (20 mg total) by mouth daily. 90 tablet 1   blood glucose meter kit and supplies KIT Dispense based on patient and insurance preference. Use once daily.  Dx E11.9 1 each 2   CLOMID 50 MG tablet Take 25 mg by mouth daily.     diclofenac  (VOLTAREN ) 75 MG EC tablet TAKE 1 TABLET BY MOUTH TWICE DAILY AS NEEDED FOR PAIN 30 tablet 2   dutasteride  (AVODART ) 0.5 MG capsule Take 1 capsule (0.5 mg total) by mouth daily. 90 capsule 1   Eszopiclone  3 MG TABS TAKE 1 TABLET BY MOUTH AT BEDTIME AS NEEDED FOR INSOMNIA 90 tablet 1   fluticasone  (FLONASE ) 50 MCG/ACT nasal spray Place 2 sprays into both nostrils daily. 16 g 0   gabapentin  (NEURONTIN ) 100 MG capsule TAKE 1 CAPSULE(100 MG) BY MOUTH THREE TIMES DAILY 270 capsule 0   metFORMIN  (GLUCOPHAGE ) 500 MG tablet Take 1 tablet (500 mg total) by mouth 2 (two) times daily with a meal. 180 tablet 3   Multiple Vitamins-Minerals (MULTIVITAMIN MEN 50+ PO) Take 1 tablet by mouth daily.     nitroGLYCERIN  (NITROSTAT ) 0.4  MG SL tablet Place 1 tablet (0.4 mg total) under the tongue every 5 (five) minutes as needed. 25 tablet 6   omeprazole  (PRILOSEC) 20 MG capsule TAKE ONE CAPSULE BY MOUTH AS NEEDED 90 capsule 1   silodosin (RAPAFLO) 8 MG CAPS capsule Take 8 mg by mouth daily.  venlafaxine  XR (EFFEXOR -XR) 150 MG 24 hr capsule Take 1 capsule (150 mg total) by mouth daily with breakfast. 90 capsule 0   venlafaxine  XR (EFFEXOR -XR) 75 MG 24 hr capsule Take 1 capsule (75 mg total) by mouth daily with breakfast. 90 capsule 0   No current facility-administered medications on file prior to visit.    Review of Systems:  ***  Physical Examination: There were no vitals filed for this visit. There were no vitals filed for this visit. There is no height or weight on file to calculate BMI. Ideal Body Weight:    ***  Assessment and Plan: ***  Signed Gates Kasal, MD

## 2023-09-01 ENCOUNTER — Ambulatory Visit: Payer: Self-pay | Admitting: Family Medicine

## 2023-09-01 VITALS — BP 124/82 | HR 89 | Temp 98.3°F | Resp 18 | Ht 68.0 in | Wt 180.2 lb

## 2023-09-01 DIAGNOSIS — E7800 Pure hypercholesterolemia, unspecified: Secondary | ICD-10-CM

## 2023-09-01 DIAGNOSIS — G63 Polyneuropathy in diseases classified elsewhere: Secondary | ICD-10-CM | POA: Insufficient documentation

## 2023-09-01 DIAGNOSIS — Z130 Encounter for screening for diseases of the blood and blood-forming organs and certain disorders involving the immune mechanism: Secondary | ICD-10-CM | POA: Diagnosis not present

## 2023-09-01 DIAGNOSIS — E1142 Type 2 diabetes mellitus with diabetic polyneuropathy: Secondary | ICD-10-CM

## 2023-09-01 DIAGNOSIS — Z125 Encounter for screening for malignant neoplasm of prostate: Secondary | ICD-10-CM

## 2023-09-01 DIAGNOSIS — F4321 Adjustment disorder with depressed mood: Secondary | ICD-10-CM

## 2023-09-01 HISTORY — DX: Polyneuropathy in diseases classified elsewhere: G63

## 2023-09-01 MED ORDER — BUPROPION HCL ER (XL) 150 MG PO TB24
150.0000 mg | ORAL_TABLET | Freq: Every day | ORAL | 3 refills | Status: DC
Start: 2023-09-01 — End: 2024-01-20

## 2023-09-01 MED ORDER — ATORVASTATIN CALCIUM 20 MG PO TABS
20.0000 mg | ORAL_TABLET | Freq: Every day | ORAL | 1 refills | Status: DC
Start: 2023-09-01 — End: 2024-03-01

## 2023-09-01 NOTE — Patient Instructions (Addendum)
 It was good to see you today, I am sorry that things are so hard.  I am also so sorry that Goble Last is struggling  Lets have you change from venlafaxine /Effexor  to bupropion .  Please decrease the venlafaxine  to 150 mg daily for 5 days, then decrease to 75 mg daily for 5 days, then you can stop and change onto bupropion .  Start with bupropion  150 daily for 1 to 2 weeks, then can increase to 300 mg Please let me know how this seems to work for you-if you would, please send me a MyChart message in about 1 month.  Sooner if things are not going okay  Keeping up with some counseling may be helpful  As we discussed, you seem to have some neuropathy in your feet.  This is likely due to your diabetes, although it is well-controlled  Assuming all is well lets follow-up in about 6 months  Recommend COVID booster if none the last 6 months or so

## 2023-09-02 ENCOUNTER — Encounter: Payer: Self-pay | Admitting: Family Medicine

## 2023-09-02 DIAGNOSIS — E1142 Type 2 diabetes mellitus with diabetic polyneuropathy: Secondary | ICD-10-CM

## 2023-09-02 DIAGNOSIS — H539 Unspecified visual disturbance: Secondary | ICD-10-CM

## 2023-09-02 LAB — COMPREHENSIVE METABOLIC PANEL WITH GFR
ALT: 15 U/L (ref 0–53)
AST: 17 U/L (ref 0–37)
Albumin: 4.7 g/dL (ref 3.5–5.2)
Alkaline Phosphatase: 36 U/L — ABNORMAL LOW (ref 39–117)
BUN: 12 mg/dL (ref 6–23)
CO2: 28 meq/L (ref 19–32)
Calcium: 9.9 mg/dL (ref 8.4–10.5)
Chloride: 101 meq/L (ref 96–112)
Creatinine, Ser: 0.82 mg/dL (ref 0.40–1.50)
GFR: 91.44 mL/min (ref >60.00)
Glucose, Bld: 135 mg/dL — ABNORMAL HIGH (ref 70–99)
Potassium: 4.1 meq/L (ref 3.5–5.1)
Sodium: 138 meq/L (ref 135–145)
Total Bilirubin: 0.6 mg/dL (ref 0.2–1.2)
Total Protein: 7.3 g/dL (ref 6.0–8.3)

## 2023-09-02 LAB — CBC
HCT: 44.1 % (ref 39.0–52.0)
Hemoglobin: 14.6 g/dL (ref 13.0–17.0)
MCHC: 33.1 g/dL (ref 30.0–36.0)
MCV: 91.3 fl (ref 78.0–100.0)
Platelets: 211 10*3/uL (ref 150.0–400.0)
RBC: 4.83 Mil/uL (ref 4.22–5.81)
RDW: 12.6 % (ref 11.5–15.5)
WBC: 7.2 10*3/uL (ref 4.0–10.5)

## 2023-09-02 LAB — LIPID PANEL
Cholesterol: 134 mg/dL (ref 0–200)
HDL: 51 mg/dL (ref >39.00)
LDL Cholesterol: 49 mg/dL (ref 0–99)
NonHDL: 83.13
Total CHOL/HDL Ratio: 3
Triglycerides: 171 mg/dL — ABNORMAL HIGH (ref 0.0–149.0)
VLDL: 34.2 mg/dL (ref 0.0–40.0)

## 2023-09-02 LAB — MICROALBUMIN / CREATININE URINE RATIO
Creatinine,U: 46.6 mg/dL
Microalb Creat Ratio: UNDETERMINED mg/g (ref 0.0–30.0)
Microalb, Ur: <0.7 mg/dL

## 2023-09-02 LAB — HEMOGLOBIN A1C: Hgb A1c MFr Bld: 6.2 % (ref 4.6–6.5)

## 2023-09-26 ENCOUNTER — Other Ambulatory Visit: Payer: Self-pay | Admitting: Family Medicine

## 2023-09-26 DIAGNOSIS — M25562 Pain in left knee: Secondary | ICD-10-CM

## 2023-09-27 NOTE — Addendum Note (Signed)
 Addended by: Gates Kasal C on: 09/27/2023 12:29 PM   Modules accepted: Orders

## 2023-09-28 ENCOUNTER — Encounter: Payer: Self-pay | Admitting: Family Medicine

## 2023-09-29 ENCOUNTER — Other Ambulatory Visit: Payer: Self-pay | Admitting: Internal Medicine

## 2023-09-29 DIAGNOSIS — G4700 Insomnia, unspecified: Secondary | ICD-10-CM

## 2023-09-30 ENCOUNTER — Other Ambulatory Visit: Payer: Self-pay | Admitting: Family Medicine

## 2023-09-30 DIAGNOSIS — G4700 Insomnia, unspecified: Secondary | ICD-10-CM

## 2023-10-10 ENCOUNTER — Ambulatory Visit
Admission: RE | Admit: 2023-10-10 | Discharge: 2023-10-10 | Disposition: A | Discharge status: Home / Self Care | Source: Ambulatory Visit | Attending: Family Medicine | Admitting: Family Medicine

## 2023-10-10 DIAGNOSIS — H539 Unspecified visual disturbance: Secondary | ICD-10-CM

## 2023-10-10 MED ORDER — GADOPICLENOL 0.5 MMOL/ML IV SOLN: 6.0000 mL | Freq: Once | INTRAVENOUS | Status: DC | PRN

## 2023-10-11 NOTE — Addendum Note (Signed)
 Addended by: Gates Kasal C on: 10/11/2023 04:52 PM   Modules accepted: Orders

## 2023-10-12 ENCOUNTER — Other Ambulatory Visit: Payer: Self-pay | Admitting: Internal Medicine

## 2023-10-12 MED ORDER — BLOOD GLUCOSE TEST VI STRP: ORAL_STRIP | 3 refills | Status: AC

## 2023-10-12 MED ORDER — LANCETS MISC. MISC: 1.0000 | Freq: Three times a day (TID) | 0 refills | Status: AC

## 2023-10-12 MED ORDER — LANCET DEVICE MISC: 1.0000 | Freq: Two times a day (BID) | 1 refills | Status: AC | PRN

## 2023-10-12 MED ORDER — BLOOD GLUCOSE MONITORING SUPPL DEVI
1.0000 | Freq: Three times a day (TID) | 0 refills | Status: AC
Start: 2023-10-12 — End: ?

## 2023-10-12 NOTE — Addendum Note (Signed)
 Addended by: Gates Kasal C on: 10/12/2023 02:12 PM   Modules accepted: Orders

## 2023-10-13 ENCOUNTER — Other Ambulatory Visit: Payer: Self-pay | Admitting: Internal Medicine

## 2023-10-13 DIAGNOSIS — K219 Gastro-esophageal reflux disease without esophagitis: Secondary | ICD-10-CM

## 2023-11-11 ENCOUNTER — Other Ambulatory Visit: Payer: Self-pay | Admitting: Family Medicine

## 2023-11-20 ENCOUNTER — Other Ambulatory Visit: Payer: Self-pay | Admitting: Family Medicine

## 2023-11-20 DIAGNOSIS — M25562 Pain in left knee: Secondary | ICD-10-CM

## 2023-11-20 DIAGNOSIS — E1142 Type 2 diabetes mellitus with diabetic polyneuropathy: Secondary | ICD-10-CM

## 2023-11-25 ENCOUNTER — Encounter: Payer: Self-pay | Admitting: Family Medicine

## 2023-11-30 MED ORDER — VENLAFAXINE HCL ER 75 MG PO CP24: 75.0000 mg | ORAL_CAPSULE | Freq: Every day | ORAL | 3 refills | Status: DC

## 2023-11-30 NOTE — Addendum Note (Signed)
 Addended by: WATT RAISIN C on: 11/30/2023 11:12 AM   Modules accepted: Orders

## 2023-12-11 ENCOUNTER — Other Ambulatory Visit: Payer: Self-pay | Admitting: Family Medicine

## 2023-12-11 DIAGNOSIS — G4700 Insomnia, unspecified: Secondary | ICD-10-CM

## 2023-12-15 ENCOUNTER — Telehealth: Payer: Self-pay

## 2023-12-15 DIAGNOSIS — G4700 Insomnia, unspecified: Secondary | ICD-10-CM

## 2023-12-15 MED ORDER — ESZOPICLONE 3 MG PO TABS: ORAL_TABLET | ORAL | 0 refills | Status: DC

## 2023-12-15 NOTE — Telephone Encounter (Signed)
 Copied from CRM #[id number]. Topic: Clinical - Medication Prior Auth >> Dec 15, 2023 11:08 AM Deleta RAMAN wrote: Reason for CRM: [facility] is calling about the  Eszopiclone  3 MG tablet patient has no refills and would like to know if the patient can be approved for one.

## 2023-12-15 NOTE — Addendum Note (Signed)
 Addended by: WATT RAISIN C on: 12/15/2023 04:29 PM   Modules accepted: Orders

## 2023-12-17 ENCOUNTER — Ambulatory Visit: Admitting: Neurology

## 2023-12-17 ENCOUNTER — Encounter: Payer: Self-pay | Admitting: Neurology

## 2023-12-17 VITALS — Ht 70.0 in | Wt 168.0 lb

## 2023-12-17 DIAGNOSIS — F4321 Adjustment disorder with depressed mood: Secondary | ICD-10-CM | POA: Diagnosis not present

## 2023-12-17 DIAGNOSIS — E1142 Type 2 diabetes mellitus with diabetic polyneuropathy: Secondary | ICD-10-CM

## 2023-12-17 DIAGNOSIS — F32A Depression, unspecified: Secondary | ICD-10-CM

## 2023-12-17 DIAGNOSIS — Z7984 Long term (current) use of oral hypoglycemic drugs: Secondary | ICD-10-CM

## 2023-12-17 DIAGNOSIS — R42 Dizziness and giddiness: Secondary | ICD-10-CM | POA: Diagnosis not present

## 2023-12-17 NOTE — Progress Notes (Signed)
 [facility]  PATIENT: Luis Peterson DOB: May 19, 1955  REQUESTING CLINICIAN: Copland, Harlene BROCKS, MD HISTORY FROM: Patient  REASON FOR VISIT: dizziness/word finding difficulty    HISTORICAL  CHIEF COMPLAINT:  Chief Complaint  Patient presents with   New Patient (Initial Visit)    Rm 13, vision problems, dizziness, word finiding    HISTORY OF PRESENT ILLNESS:  This is a 68 year old gentleman past medical history of hyperlipidemia, heart disease, depression and grief following the death of his wife a year ago who is presenting with complaint of dizziness.  Patient describes dizziness as feeling unsteady, he denies any room spinning sensation.  Denies any falls associated with the dizziness.  Denies any nausea or vomiting associated with the dizziness.  He is not currently taking any medication for it.  He did have a MRI brain which shows some generalized atrophy but no finding related to dizziness. He also tells me that last May he lost his wife after battling cancer, sarcoma for 18 months.  This was very traumatic for him and his son.  He did grief counseling, still having good support once a month but he feels down.  He is on Wellbutrin  and venlafaxine .  He still works as a Airline pilot at [employer].  Independent in all his ADLs.  OTHER MEDICAL CONDITIONS: Depression, Grief, Hyperlipidemia, Diabetes    REVIEW OF SYSTEMS: Full 14 system review of systems performed and negative with exception of: As noted in the HPI   ALLERGIES: Allergies  Allergen Reactions   Zocor [Simvastatin]     REACTION: Rash    HOME MEDICATIONS: Outpatient Medications Prior to Visit  Medication Sig Dispense Refill   albuterol  (VENTOLIN  HFA) 108 (90 Base) MCG/ACT inhaler Inhale 1-2 puffs into the lungs every 6 (six) hours as needed. 8 g 0   ALPRAZolam  (XANAX ) 0.5 MG tablet TAKE 1 TABLET(0.5 MG) BY MOUTH THREE TIMES DAILY AS NEEDED FOR ANXIETY 90 tablet 1   atorvastatin  (LIPITOR) 20  MG tablet Take 1 tablet (20 mg total) by mouth daily. 90 tablet 1   blood glucose meter kit and supplies KIT Dispense based on patient and insurance preference. Use once daily.  Dx E11.9 1 each 2   Blood Glucose Monitoring Suppl DEVI 1 each by Does not apply route in the morning, at noon, and at bedtime. May substitute to any manufacturer covered by patient's insurance. 1 each 0   buPROPion  (WELLBUTRIN  XL) 150 MG 24 hr tablet Take 1 tablet (150 mg total) by mouth daily. Increase to 300 mg after 1-2 weeks 60 tablet 3   CLOMID 50 MG tablet Take 25 mg by mouth daily.     diclofenac  (VOLTAREN ) 75 MG EC tablet TAKE 1 TABLET BY MOUTH TWICE DAILY AS NEEDED FOR PAIN 30 tablet 2   dutasteride  (AVODART ) 0.5 MG capsule Take 1 capsule (0.5 mg total) by mouth daily. 90 capsule 1   Eszopiclone  3 MG TABS TAKE 1 TABLET BY MOUTH AT BEDTIME AS NEEDED FOR INSOMNIA 90 tablet 0   fluticasone  (FLONASE ) 50 MCG/ACT nasal spray Place 2 sprays into both nostrils daily. 16 g 0   gabapentin  (NEURONTIN ) 100 MG capsule TAKE 1 CAPSULE(100 MG) BY MOUTH THREE TIMES DAILY 270 capsule 0   Glucose Blood (BLOOD GLUCOSE TEST STRIPS) STRP Use the check blood sugar twice daily as needed. Pt prefers One Touch brand. May substitute to any manufacturer covered by patient's insurance. 100 strip 3   Lancets (ONETOUCH DELICA PLUS LANCET30G) MISC Check blood sugars  once daily 100 each 3   metFORMIN  (GLUCOPHAGE ) 500 MG tablet Take 1 tablet (500 mg total) by mouth 2 (two) times daily with a meal. 180 tablet 3   Multiple Vitamins-Minerals (MULTIVITAMIN MEN 50+ PO) Take 1 tablet by mouth daily.     nitroGLYCERIN  (NITROSTAT ) 0.4 MG SL tablet Place 1 tablet (0.4 mg total) under the tongue every 5 (five) minutes as needed. 25 tablet 6   omeprazole  (PRILOSEC) 20 MG capsule TAKE ONE CAPSULE BY MOUTH AS NEEDED 90 capsule 1   silodosin (RAPAFLO) 8 MG CAPS capsule Take 8 mg by mouth daily.     venlafaxine  XR (EFFEXOR  XR) 75 MG 24 hr capsule Take 1 capsule  (75 mg total) by mouth daily with breakfast. 90 capsule 3   No facility-administered medications prior to visit.    PAST MEDICAL HISTORY: Past Medical History:  Diagnosis Date   Anxiety state 01/03/2007   Qualifier: Diagnosis of  By: Kassie MD, Alyce A    Arthralgia 10/30/2009   Qualifier: Diagnosis of  By: Kassie MD, Sean A    BEN LOC HYPERPLASIA PROS W/UR OBST & OTH LUTS 06/19/2008   Dr. Liliana   Bilateral shoulder pain 08/13/2016   CAD (coronary artery disease) 06/06/2020   Degenerative joint disease of cervical spine    Depression    DM (diabetes mellitus), type 2 (HCC) 10/20/2018   DOE (dyspnea on exertion) 10/29/2017   DYSPNEA 09/01/2007   Qualifier: Diagnosis of  By: Kassie MD, Sean A    FOOT PAIN 10/30/2009   Qualifier: Diagnosis of  By: Kassie MD, Sean A    GERD 06/19/2008   Qualifier: Diagnosis of  By: Kassie MD, Sean A    Hearing loss 06/19/2008   Qualifier: Diagnosis of   By: Kassie MD, Alyce LABOR     IMO SNOMED Dx Update Oct 2024     HYPERCHOLESTEROLEMIA 09/01/2007   Qualifier: Diagnosis of  By: Kassie MD, Sean A    Hyperglycemia 09/01/2007   Qualifier: Diagnosis of  By: Kassie MD, Sean A    INSOMNIA 06/19/2008   Qualifier: Diagnosis of  By: Kassie MD, Sean A    Low back pain 08/31/2013   NASH (nonalcoholic steatohepatitis) 09/28/2017   Positive H. 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   Qualifier: Diagnosis of  By: Kassie MD, Sean A    Rectal bleeding 02/03/2016   Screening for prostate cancer 09/03/2014   Spondylosis 06/19/2008   Qualifier: Diagnosis of   By: Kassie MD, Alyce LABOR     IMO SNOMED Dx Update Oct 2024     Weight gain 09/28/2017   Wellness examination 09/26/2015    PAST SURGICAL HISTORY: Past Surgical History:  Procedure Laterality Date   COLONOSCOPY     ELECTROCARDIOGRAM  05/28/2006   POLYPECTOMY     Stress Cardiolite  08/22/2003   TYMPANOPLASTY  03/1996   Right    FAMILY HISTORY: Family History  Problem Relation Age  of Onset   Stomach cancer Mother    Heart disease Mother    Gastric cancer Father        died age 13   Stroke Father    Colon cancer Sister 16   Other Brother        prostate issues, x 2 brothers   Rectal cancer Neg Hx    Crohn's disease Neg Hx    Esophageal cancer Neg Hx     SOCIAL HISTORY: Social History   Socioeconomic History   Marital status:  Married    Spouse name: Not on file   Number of children: 1   Years of education: Not on file   Highest education level: Doctorate  Occupational History   Occupation: Automotive engineer Professor     Employer: [employer]  Tobacco Use   Smoking status: Former    Types: Cigarettes    Passive exposure: Never   Smokeless tobacco: Never  Vaping Use   Vaping status: Never Used  Substance and Sexual Activity   Alcohol use: Yes    Comment: social   Drug use: No   Sexual activity: Not on file  Other Topics Concern   Not on file  Social History Narrative   Right handed   Works- professor at [employer] cup daily   Social Drivers of Health   Financial Resource Strain: Low Risk  (02/28/2023)   Overall Financial Resource Strain (CARDIA)    Difficulty of Paying Living Expenses: Not hard at all  Food Insecurity: No Food Insecurity (02/28/2023)   Hunger Vital Sign    Worried About Running Out of Food in the Last Year: Never true    Ran Out of Food in the Last Year: Never true  Transportation Needs: No Transportation Needs (02/28/2023)   PRAPARE - Administrator, Civil Service (Medical): No    Lack of Transportation (Non-Medical): No  Physical Activity: Insufficiently Active (02/28/2023)   Exercise Vital Sign    Days of Exercise per Week: 2 days    Minutes of Exercise per Session: 20 min  Stress: Patient Declined (02/28/2023)   [employer] of Occupational Health - Occupational Stress Questionnaire    Feeling of Stress : Patient declined  Social Connections: Unknown (02/28/2023)   Social Connection and  Isolation Panel    Frequency of Communication with Friends and Family: Twice a week    Frequency of Social Gatherings with Friends and Family: Patient declined    Attends Religious Services: Never    Database administrator or Organizations: No    Attends Engineer, structural: Not on file    Marital Status: Widowed  Intimate Partner Violence: Not on file    PHYSICAL EXAM  GENERAL EXAM/CONSTITUTIONAL: Vitals:  Vitals:   12/17/23 0845  Weight: 168 lb (76.2 kg)  Height: 5' 10 (1.778 m)   Body mass index is 24.11 kg/m. Wt Readings from Last 3 Encounters:  12/17/23 168 lb (76.2 kg)  09/01/23 180 lb 3.2 oz (81.7 kg)  03/31/23 183 lb (83 kg)   Patient is in no distress; well developed, nourished and groomed; neck is supple  MUSCULOSKELETAL: Gait, strength, tone, movements noted in Neurologic exam below  NEUROLOGIC: MENTAL STATUS:      No data to display         awake, alert, oriented to person, place and time recent and remote memory intact normal attention and concentration language fluent, comprehension intact, naming intact fund of knowledge appropriate  CRANIAL NERVE:  2nd, 3rd, 4th, 6th - Visual fields full to confrontation, extraocular muscles intact, no nystagmus 5th - facial sensation symmetric 7th - facial strength symmetric 8th - hearing intact 9th - palate elevates symmetrically, uvula midline 11th - shoulder shrug symmetric 12th - tongue protrusion midline  MOTOR:  normal bulk and tone, full strength in the BUE, BLE  SENSORY:  normal and symmetric to light touch, there is decrease vibration up to ankle bilaterally, right greater than left.   COORDINATION:  finger-nose-finger, fine finger movements normal  GAIT/STATION:  normal    DIAGNOSTIC DATA (LABS, IMAGING, TESTING) - I reviewed patient records, labs, notes, testing and imaging myself where available.  Lab Results  Component Value Date   WBC 7.2 09/01/2023   HGB 14.6  09/01/2023   HCT 44.1 09/01/2023   MCV 91.3 09/01/2023   PLT 211.0 09/01/2023      Component Value Date/Time   NA 138 09/01/2023 1430   NA 139 03/27/2021 1019   K 4.1 09/01/2023 1430   CL 101 09/01/2023 1430   CO2 28 09/01/2023 1430   GLUCOSE 135 (H) 09/01/2023 1430   GLUCOSE 108 (H) 05/21/2006 0911   BUN 12 09/01/2023 1430   BUN 13 03/27/2021 1019   CREATININE 0.82 09/01/2023 1430   CREATININE 0.80 02/21/2020 1349   CALCIUM  9.9 09/01/2023 1430   PROT 7.3 09/01/2023 1430   PROT 7.5 03/27/2021 1019   ALBUMIN 4.7 09/01/2023 1430   ALBUMIN 5.1 (H) 03/27/2021 1019   AST 17 09/01/2023 1430   ALT 15 09/01/2023 1430   ALKPHOS 36 (L) 09/01/2023 1430   BILITOT 0.6 09/01/2023 1430   BILITOT 0.6 03/27/2021 1019   GFRNONAA >60 05/06/2022 2006   GFRAA 110 02/19/2020 1607   Lab Results  Component Value Date   CHOL 134 09/01/2023   HDL 51.00 09/01/2023   LDLCALC 49 09/01/2023   LDLDIRECT 81.0 10/19/2018   TRIG 171.0 (H) 09/01/2023   CHOLHDL 3 09/01/2023   Lab Results  Component Value Date   HGBA1C 6.2 09/01/2023   Lab Results  Component Value Date   VITAMINB12 551 06/15/2022   Lab Results  Component Value Date   TSH 0.92 04/17/2021    MRI Brain 10/11/2023 1.  No evidence of an acute intracranial abnormality. 2. Mild chronic small vessel ischemic changes within the cerebral white matter. 3. Mild generalized cerebral atrophy. 4. Paranasal sinus disease as described.   ASSESSMENT AND PLAN  68 y.o. year old male with history of hyperlipidemia, heart disease, diabetes with diabetic peripheral neuropathy, depression and grief following the death of his wife 4-months ago who is presenting with complaint of dizziness.  Dizziness described as feeling unsteady.  He does not have any signs of vertigo and does not describe any vertigo symptoms.  Dizziness might be related to hypovolemia, advised him to increase his fluid intake or it may be also related to diabetic peripheral  neuropathy, for which I have advised him to continue to well manage his diabetes.  He last hemoglobin A1c was within the normal range. When he comes to his depressive symptoms, he is already on Wellbutrin  and Venlafaxine , I have referred patient to psychiatry for further evaluation and management.  He is getting group counseling once a month.  I did advise him to look into getting individual therapy session at least twice a month.  He voiced understanding.  I will follow-up with with him in 75-month or sooner if worse.   1. Dizziness   2. Diabetic polyneuropathy associated with type 2 diabetes mellitus (HCC)   3. Depression, unspecified depression type   4. Grief      Patient Instructions  Continue current medication Increase fluid intake Referral to psychiatry for management of depression/grief Continue with therapy, try to increase the session at least twice a month Continue follow-up PCP Return in 6 to 8 months or sooner if worse  Orders Placed This Encounter  Procedures   Ambulatory referral to Psychiatry    No orders of the defined types were placed in this encounter.  Return in about 6 months (around 06/18/2024).  I personally spent a total of 50 minutes in the care of the patient today including preparing to see the patient, getting/reviewing separately obtained history, performing a medically appropriate exam/evaluation, counseling and educating, placing orders, referring and communicating with other health care professionals, documenting clinical information in the EHR, independently interpreting results, and communicating results.   Pastor Falling, MD 12/19/2023, 9:41 AM  [facility] [address] [place], [place] [address] [phone]

## 2023-12-19 NOTE — Patient Instructions (Addendum)
 Continue current medication Increase fluid intake Referral to psychiatry for management of depression/grief Continue with therapy, try to increase the session at least twice a month Continue follow-up PCP Return in 6 to 8 months or sooner if worse

## 2023-12-20 ENCOUNTER — Telehealth: Payer: Self-pay | Admitting: Neurology

## 2023-12-20 NOTE — Telephone Encounter (Signed)
 Referral for psychiatry fax to [facility]. Phone: [phone], Fax: [phone]

## 2023-12-29 NOTE — Telephone Encounter (Signed)
 We received a fax that the patient told them he will call them back to schedule.

## 2023-12-31 ENCOUNTER — Other Ambulatory Visit: Payer: Self-pay | Admitting: Family Medicine

## 2023-12-31 DIAGNOSIS — F330 Major depressive disorder, recurrent, mild: Secondary | ICD-10-CM

## 2023-12-31 NOTE — Telephone Encounter (Signed)
 Rx for venlafaxine  150mg  denied, dose changed to 75mg 

## 2024-01-04 ENCOUNTER — Other Ambulatory Visit: Payer: Self-pay

## 2024-01-05 ENCOUNTER — Ambulatory Visit: Attending: Cardiology | Admitting: Cardiology

## 2024-01-05 ENCOUNTER — Encounter: Payer: Self-pay | Admitting: Cardiology

## 2024-01-05 VITALS — BP 114/70 | HR 61 | Ht 70.0 in | Wt 165.1 lb

## 2024-01-05 DIAGNOSIS — E782 Mixed hyperlipidemia: Secondary | ICD-10-CM

## 2024-01-05 DIAGNOSIS — I2510 Atherosclerotic heart disease of native coronary artery without angina pectoris: Secondary | ICD-10-CM | POA: Diagnosis not present

## 2024-01-05 DIAGNOSIS — E088 Diabetes mellitus due to underlying condition with unspecified complications: Secondary | ICD-10-CM

## 2024-01-05 NOTE — Patient Instructions (Signed)
 Medication Instructions:  Your physician recommends that you continue on your current medications as directed. Please refer to the Current Medication list given to you today.  *If you need a refill on your cardiac medications before your next appointment, please call your pharmacy*   Lab Work: None ordered If you have labs (blood work) drawn today and your tests are completely normal, you will receive your results only by: MyChart Message (if you have MyChart) OR A paper copy in the mail If you have any lab test that is abnormal or we need to change your treatment, we will call you to review the results.   Testing/Procedures: None ordered   Follow-Up: At [facility], you and your health needs are our priority.  As part of our continuing mission to provide you with exceptional heart care, we have created designated Provider Care Teams.  These Care Teams include your primary Cardiologist (physician) and Advanced Practice Providers (APPs -  Physician Assistants and Nurse Practitioners) who all work together to provide you with the care you need, when you need it.  We recommend signing up for the patient portal called MyChart.  Sign up information is provided on this After Visit Summary.  MyChart is used to connect with patients for Virtual Visits (Telemedicine).  Patients are able to view lab/test results, encounter notes, upcoming appointments, etc.  Non-urgent messages can be sent to your provider as well.   To learn more about what you can do with MyChart, go to [email or web address].    Your next appointment:   9 month  The format for your next appointment:   In Person  Provider:   Jennifer Crape, MD    Other Instructions none  Important Information About Sugar

## 2024-01-05 NOTE — Progress Notes (Signed)
 Cardiology Office Note:    Date:  01/05/2024   ID:  Luis Peterson, DOB [birth year], MRN [id number]  PCP:  Watt Harlene BROCKS, MD  Cardiologist:  Jennifer JONELLE Crape, MD   Referring MD: Watt Harlene BROCKS, MD    ASSESSMENT:    1. Coronary artery disease involving native coronary artery of native heart without angina pectoris   2. Diabetes mellitus due to underlying condition with unspecified complications (HCC)   3. Mixed dyslipidemia    PLAN:    In order of problems listed above:  Elevated calcium  score: Secondary prevention stressed with the patient.  Importance of events with diet medication stressed any vocalized understanding.  He was advised to walk at least half an hour a day on a daily basis. Essential hypertension: Blood pressure is stable and diet was emphasized.  Lifestyle modification urged. Mixed dyslipidemia: On lipid-lowering medications followed by primary care.  Lipids are fine and I discussed the results with him. Patient will be seen in follow-up appointment in 6 months or earlier if the patient has any concerns.    Medication Adjustments/Labs and Tests Ordered: Current medicines are reviewed at length with the patient today.  Concerns regarding medicines are outlined above.  Orders Placed This Encounter  Procedures   EKG 12-Lead   No orders of the defined types were placed in this encounter.    No chief complaint on file.    History of Present Illness:    Luis Peterson is a 68 y.o. male.  Patient has past medical history of elevated calcium  score, essential hypertension and mixed dyslipidemia.  She has history of diabetes mellitus.  He denies any problems at this time and takes care of activities of daily living.  He has had a good job offer from [employer] [place]  so is planning to move there in the next few months.  At the time of my evaluation, the patient is alert awake oriented and in no distress.  Past Medical History:  Diagnosis  Date   Anxiety state 01/03/2007   Qualifier: Diagnosis of  By: Kassie MD, Alyce A    Arthralgia 10/30/2009   Qualifier: Diagnosis of  By: Kassie MD, Sean A    BEN LOC HYPERPLASIA PROS W/UR OBST & OTH LUTS 06/19/2008   Dr. Liliana   Bilateral shoulder pain 08/13/2016   CAD (coronary artery disease) 06/06/2020   Chest pain of uncertain etiology 03/01/2023   Degenerative joint disease of cervical spine    Depression    Diabetes mellitus due to underlying condition with unspecified complications (HCC) 03/01/2023   DM (diabetes mellitus), type 2 (HCC) 10/20/2018   DOE (dyspnea on exertion) 10/29/2017   DYSPNEA 09/01/2007   Qualifier: Diagnosis of  By: Kassie MD, Sean A    FOOT PAIN 10/30/2009   Qualifier: Diagnosis of  By: Kassie MD, Sean A    GERD 06/19/2008   Qualifier: Diagnosis of  By: Kassie MD, Sean A    Hearing loss 06/19/2008   Qualifier: Diagnosis of   By: Kassie MD, Alyce LABOR     IMO SNOMED Dx Update Oct 2024     HYPERCHOLESTEROLEMIA 09/01/2007   Qualifier: Diagnosis of  By: Kassie MD, Sean A    Hyperglycemia 09/01/2007   Qualifier: Diagnosis of  By: Kassie MD, Sean A    INSOMNIA 06/19/2008   Qualifier: Diagnosis of  By: Kassie MD, Sean A    Low back pain 08/31/2013   Mixed dyslipidemia 03/01/2023   NASH (nonalcoholic steatohepatitis)  09/28/2017   Polyneuropathy associated with underlying disease (HCC) 09/01/2023   Positive H. 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   Qualifier: Diagnosis of  By: Kassie MD, Sean A    Rectal bleeding 02/03/2016   Screening for prostate cancer 09/03/2014   Spondylosis 06/19/2008   Qualifier: Diagnosis of   By: Kassie MD, Alyce LABOR     IMO SNOMED Dx Update Oct 2024     Weight gain 09/28/2017   Wellness examination 09/26/2015    Past Surgical History:  Procedure Laterality Date   COLONOSCOPY     ELECTROCARDIOGRAM  05/28/2006   POLYPECTOMY     Stress Cardiolite  08/22/2003   TYMPANOPLASTY  03/1996   Right    Current  Medications: Current Meds  Medication Sig   ALPRAZolam  (XANAX ) 0.5 MG tablet TAKE 1 TABLET(0.5 MG) BY MOUTH THREE TIMES DAILY AS NEEDED FOR ANXIETY   atorvastatin  (LIPITOR) 20 MG tablet Take 1 tablet (20 mg total) by mouth daily.   blood glucose meter kit and supplies KIT Dispense based on patient and insurance preference. Use once daily.  Dx E11.9   Blood Glucose Monitoring Suppl DEVI 1 each by Does not apply route in the morning, at noon, and at bedtime. May substitute to any manufacturer covered by patient's insurance.   buPROPion  (WELLBUTRIN  XL) 150 MG 24 hr tablet Take 1 tablet (150 mg total) by mouth daily. Increase to 300 mg after 1-2 weeks   CLOMID 50 MG tablet Take 25 mg by mouth daily.   diclofenac  (VOLTAREN ) 75 MG EC tablet TAKE 1 TABLET BY MOUTH TWICE DAILY AS NEEDED FOR PAIN   dutasteride  (AVODART ) 0.5 MG capsule Take 1 capsule (0.5 mg total) by mouth daily.   Eszopiclone  3 MG TABS TAKE 1 TABLET BY MOUTH AT BEDTIME AS NEEDED FOR INSOMNIA   gabapentin  (NEURONTIN ) 100 MG capsule TAKE 1 CAPSULE(100 MG) BY MOUTH THREE TIMES DAILY   Glucose Blood (BLOOD GLUCOSE TEST STRIPS) STRP Use the check blood sugar twice daily as needed. Pt prefers One Touch brand. May substitute to any manufacturer covered by patient's insurance.   Lancets (ONETOUCH DELICA PLUS LANCET30G) MISC Check blood sugars once daily   metFORMIN  (GLUCOPHAGE ) 500 MG tablet Take 1 tablet (500 mg total) by mouth 2 (two) times daily with a meal.   Multiple Vitamins-Minerals (MULTIVITAMIN MEN 50+ PO) Take 1 tablet by mouth daily.   omeprazole  (PRILOSEC) 20 MG capsule TAKE ONE CAPSULE BY MOUTH AS NEEDED   silodosin (RAPAFLO) 8 MG CAPS capsule Take 8 mg by mouth daily.   venlafaxine  XR (EFFEXOR  XR) 75 MG 24 hr capsule Take 1 capsule (75 mg total) by mouth daily with breakfast.     Allergies:   Zocor [simvastatin]   Social History   Socioeconomic History   Marital status: Married    Spouse name: Not on file   Number of  children: 1   Years of education: Not on file   Highest education level: Doctorate  Occupational History   Occupation: Automotive engineer Professor     Employer: [employer]  Tobacco Use   Smoking status: Former    Types: Cigarettes    Passive exposure: Never   Smokeless tobacco: Never  Vaping Use   Vaping status: Never Used  Substance and Sexual Activity   Alcohol use: Yes    Comment: social   Drug use: No   Sexual activity: Not on file  Other Topics Concern   Not on file  Social History Narrative  Right handed   Works- professor at [employer] cup daily   Social Drivers of [employer]: Low Risk  (02/28/2023)   Overall Financial Resource Strain (CARDIA)    Difficulty of Paying Living Expenses: Not hard at all  Food Insecurity: No Food Insecurity (02/28/2023)   Hunger Vital Sign    Worried About Running Out of Food in the Last Year: Never true    Ran Out of Food in the Last Year: Never true  Transportation Needs: No Transportation Needs (02/28/2023)   PRAPARE - Administrator, Civil Service (Medical): No    Lack of Transportation (Non-Medical): No  Physical Activity: Insufficiently Active (02/28/2023)   Exercise Vital Sign    Days of Exercise per Week: 2 days    Minutes of Exercise per Session: 20 min  Stress: Patient Declined (02/28/2023)   [employer] of Occupational Health - Occupational Stress Questionnaire    Feeling of Stress : Patient declined  Social Connections: Unknown (02/28/2023)   Social Connection and Isolation Panel    Frequency of Communication with Friends and Family: Twice a week    Frequency of Social Gatherings with Friends and Family: Patient declined    Attends Religious Services: Never    Database administrator or Organizations: No    Attends Engineer, structural: Not on file    Marital Status: Widowed     Family History: The patient's family history includes Colon cancer (age of onset: 57)  in his sister; Gastric cancer in his father; Heart disease in his mother; Other in his brother; Stomach cancer in his mother; Stroke in his father. There is no history of Rectal cancer, Crohn's disease, or Esophageal cancer.  ROS:   Please see the history of present illness.    All other systems reviewed and are negative.  EKGs/Labs/Other Studies Reviewed:    The following studies were reviewed today: .SABRAEKG Interpretation Date/Time:  Wednesday January 05 2024 15:24:49 EDT Ventricular Rate:  61 PR Interval:  142 QRS Duration:  78 QT Interval:  394 QTC Calculation: 396 R Axis:   -11  Text Interpretation: Normal sinus rhythm Minimal voltage criteria for LVH, may be normal variant ( R in aVL ) When compared with ECG of 06-May-2022 18:46, PREVIOUS ECG IS PRESENT Confirmed by Edwyna Backers [phone]) on 01/05/2024 3:40:10 PM     Recent Labs: 09/01/2023: ALT 15; BUN 12; Creatinine, Ser 0.82; Hemoglobin 14.6; Platelets 211.0; Potassium 4.1; Sodium 138  Recent Lipid Panel    Component Value Date/Time   CHOL 134 09/01/2023 1430   CHOL 157 03/27/2021 1019   TRIG 171.0 (H) 09/01/2023 1430   TRIG 160 (H) 05/21/2006 0911   HDL 51.00 09/01/2023 1430   HDL 54 03/27/2021 1019   CHOLHDL 3 09/01/2023 1430   VLDL 34.2 09/01/2023 1430   LDLCALC 49 09/01/2023 1430   LDLCALC 73 03/27/2021 1019   LDLCALC 76 02/21/2020 1349   LDLDIRECT 81.0 10/19/2018 1024    Physical Exam:    VS:  BP 114/70   Pulse 61   Ht 5' 10 (1.778 m)   Wt 165 lb 1.9 oz (74.9 kg)   SpO2 98%   BMI 23.69 kg/m     Wt Readings from Last 3 Encounters:  01/05/24 165 lb 1.9 oz (74.9 kg)  12/17/23 168 lb (76.2 kg)  09/01/23 180 lb 3.2 oz (81.7 kg)     GEN: Patient is in no acute distress HEENT: Normal NECK:  No JVD; No carotid bruits LYMPHATICS: No lymphadenopathy CARDIAC: Hear sounds regular, 2/6 systolic murmur at the apex. RESPIRATORY:  Clear to auscultation without rales, wheezing or rhonchi  ABDOMEN: Soft,  non-tender, non-distended MUSCULOSKELETAL:  No edema; No deformity  SKIN: Warm and dry NEUROLOGIC:  Alert and oriented x 3 PSYCHIATRIC:  Normal affect   Signed, Jennifer JONELLE Crape, MD  01/05/2024 3:41 PM    [facility]

## 2024-01-19 ENCOUNTER — Encounter: Payer: Self-pay | Admitting: Family Medicine

## 2024-01-19 DIAGNOSIS — G4700 Insomnia, unspecified: Secondary | ICD-10-CM

## 2024-01-19 DIAGNOSIS — F4321 Adjustment disorder with depressed mood: Secondary | ICD-10-CM

## 2024-01-20 MED ORDER — ZOLPIDEM TARTRATE 5 MG PO TABS: 5.0000 mg | ORAL_TABLET | Freq: Every evening | ORAL | 1 refills | Status: DC | PRN

## 2024-01-20 MED ORDER — BUPROPION HCL ER (XL) 150 MG PO TB24: ORAL_TABLET | ORAL | 3 refills | Status: DC

## 2024-01-20 MED ORDER — BUPROPION HCL ER (XL) 150 MG PO TB24: ORAL_TABLET | ORAL | 0 refills | Status: DC

## 2024-01-20 NOTE — Addendum Note (Signed)
 Addended by: WATT RAISIN C on: 01/20/2024 02:55 PM   Modules accepted: Orders

## 2024-01-20 NOTE — Addendum Note (Signed)
 Addended by: WATT RAISIN C on: 01/20/2024 01:52 PM   Modules accepted: Orders

## 2024-01-22 ENCOUNTER — Other Ambulatory Visit: Payer: Self-pay | Admitting: Family Medicine

## 2024-01-22 DIAGNOSIS — E1142 Type 2 diabetes mellitus with diabetic polyneuropathy: Secondary | ICD-10-CM

## 2024-01-31 ENCOUNTER — Other Ambulatory Visit: Payer: Self-pay | Admitting: Family Medicine

## 2024-01-31 DIAGNOSIS — M25562 Pain in left knee: Secondary | ICD-10-CM

## 2024-02-03 MED ORDER — ZOLPIDEM TARTRATE 5 MG PO TABS: 7.5000 mg | ORAL_TABLET | Freq: Every evening | ORAL | 0 refills | Status: DC | PRN

## 2024-02-03 MED ORDER — VENLAFAXINE HCL ER 150 MG PO CP24: 150.0000 mg | ORAL_CAPSULE | Freq: Every day | ORAL | 3 refills | Status: AC

## 2024-02-03 NOTE — Addendum Note (Signed)
 Addended by: WATT RAISIN C on: 02/03/2024 03:05 PM   Modules accepted: Orders

## 2024-02-03 NOTE — Addendum Note (Signed)
 Addended by: WATT RAISIN C on: 02/03/2024 12:06 PM   Modules accepted: Orders

## 2024-02-18 ENCOUNTER — Other Ambulatory Visit: Payer: Self-pay | Admitting: Family Medicine

## 2024-02-18 DIAGNOSIS — E1142 Type 2 diabetes mellitus with diabetic polyneuropathy: Secondary | ICD-10-CM

## 2024-02-29 ENCOUNTER — Other Ambulatory Visit: Payer: Self-pay | Admitting: Family Medicine

## 2024-02-29 DIAGNOSIS — E7800 Pure hypercholesterolemia, unspecified: Secondary | ICD-10-CM

## 2024-03-03 ENCOUNTER — Other Ambulatory Visit: Payer: Self-pay | Admitting: Family Medicine

## 2024-03-03 DIAGNOSIS — G4700 Insomnia, unspecified: Secondary | ICD-10-CM

## 2024-03-14 ENCOUNTER — Other Ambulatory Visit: Payer: Self-pay | Admitting: Family Medicine

## 2024-03-14 DIAGNOSIS — F4321 Adjustment disorder with depressed mood: Secondary | ICD-10-CM

## 2024-03-21 ENCOUNTER — Other Ambulatory Visit: Payer: Self-pay | Admitting: Family Medicine

## 2024-03-21 DIAGNOSIS — M25562 Pain in left knee: Secondary | ICD-10-CM

## 2024-03-22 ENCOUNTER — Encounter: Payer: Self-pay | Admitting: Family Medicine

## 2024-03-22 MED ORDER — ZOLPIDEM TARTRATE 5 MG PO TABS: 7.5000 mg | ORAL_TABLET | Freq: Every evening | ORAL | 1 refills | Status: AC | PRN

## 2024-03-28 ENCOUNTER — Encounter: Payer: Self-pay | Admitting: Family Medicine

## 2024-05-06 ENCOUNTER — Other Ambulatory Visit: Payer: Self-pay | Admitting: Family Medicine

## 2024-05-06 DIAGNOSIS — M25562 Pain in left knee: Secondary | ICD-10-CM

## 2024-05-07 ENCOUNTER — Other Ambulatory Visit: Payer: Self-pay | Admitting: Family Medicine

## 2024-05-07 DIAGNOSIS — G4700 Insomnia, unspecified: Secondary | ICD-10-CM

## 2024-05-09 NOTE — Progress Notes (Unsigned)
 [facility] [address], [address] [place], [place] [address] [phone]  Date:  05/10/2024   Name:  Luis Peterson   DOB:  [birth year]   MRN:  [id number]  PCP:  Watt Harlene BROCKS, MD    Chief Complaint: No chief complaint on file.   History of Present Illness:  Luis Peterson is a 68 y.o. very pleasant male patient who presents with the following:  Patient seen today for periodic follow-up.  I saw him most recently in April and asked him to come in medication maintenance History of diabetes, coronary disease, hyperlipidemia, neuropathy, chronic stress/anxiety/depression  It has been a tough couple of years for Luis Peterson.  His wife Luis Peterson died in May 16, 2022 after a long battle with cancer His son has always struggled with his mental health but this has gotten even worse since [name] passed away  He is using venlafaxine  150, alprazolam  0.5 3 times daily as needed, Ambien  Also treated with atorvastatin , metformin , omeprazole  ?  Is he currently under urology care, he may need PSA as well  Seen by cardiology in August: Stable, discussed secondary prevention for elevated coronary calcium .  Blood pressure okay, he is on a statin Diabetes has typically been well-controlled  Lab Results  Component Value Date   HGBA1C 6.2 09/01/2023   Eye exam Recommend a COVID booster Flu shot? Can update A1c and basic metabolic  Discussed the use of AI scribe software for clinical note transcription with the patient, who gave verbal consent to proceed.  History of Present Illness    Patient Active Problem List   Diagnosis Date Noted   Polyneuropathy associated with underlying disease 09/01/2023   Chest pain of uncertain etiology 03/01/2023   Diabetes mellitus due to underlying condition with unspecified complications (HCC) 03/01/2023   Mixed dyslipidemia 03/01/2023   CAD (coronary artery disease) 06/06/2020   Degenerative joint disease of  cervical spine    Depression    DM (diabetes mellitus), type 2 (HCC) 10/20/2018   DOE (dyspnea on exertion) 10/29/2017   Weight gain 09/28/2017   NASH (nonalcoholic steatohepatitis) 09/28/2017   Bilateral shoulder pain 08/13/2016   Rectal bleeding 02/03/2016   Wellness examination 09/26/2015   Screening for prostate cancer 09/03/2014   Low back pain 08/31/2013   Arthralgia 10/30/2009   FOOT PAIN 10/30/2009   Hearing loss 06/19/2008   GERD 06/19/2008   BEN LOC HYPERPLASIA PROS W/UR OBST & OTH LUTS 06/19/2008   Spondylosis 06/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 pylori test 07/1997    Past Medical History:  Diagnosis Date   Anxiety state 01/03/2007   Qualifier: Diagnosis of  By: Kassie MD, Alyce A    Arthralgia 10/30/2009   Qualifier: Diagnosis of  By: Kassie MD, Sean A    BEN LOC HYPERPLASIA PROS W/UR OBST & OTH LUTS 06/19/2008   Dr. Liliana   Bilateral shoulder pain 08/13/2016   CAD (coronary artery disease) 06/06/2020   Chest pain of uncertain etiology 03/01/2023   Degenerative joint disease of cervical spine    Depression    Diabetes mellitus due to underlying condition with unspecified complications (HCC) 03/01/2023   DM (diabetes mellitus), type 2 (HCC) 10/20/2018   DOE (dyspnea on exertion) 10/29/2017   DYSPNEA 09/01/2007   Qualifier: Diagnosis of  By: Kassie MD, Alyce  A    FOOT PAIN 10/30/2009   Qualifier: Diagnosis of  By: Kassie MD, Sean A    GERD 06/19/2008   Qualifier: Diagnosis of  By: Kassie MD, Alyce A    Hearing loss 06/19/2008   Qualifier: Diagnosis of   By: Kassie MD, Alyce LABOR     IMO SNOMED Dx Update Oct 2024     HYPERCHOLESTEROLEMIA 09/01/2007   Qualifier: Diagnosis of  By: Kassie MD, Sean A    Hyperglycemia 09/01/2007   Qualifier: Diagnosis of  By: Kassie MD, Sean A    INSOMNIA 06/19/2008   Qualifier:  Diagnosis of  By: Kassie MD, Sean A    Low back pain 08/31/2013   Mixed dyslipidemia 03/01/2023   NASH (nonalcoholic steatohepatitis) 09/28/2017   Polyneuropathy associated with underlying disease 09/01/2023   Positive H. 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   Qualifier: Diagnosis of  By: Kassie MD, Sean A    Rectal bleeding 02/03/2016   Screening for prostate cancer 09/03/2014   Spondylosis 06/19/2008   Qualifier: Diagnosis of   By: Kassie MD, Alyce LABOR     IMO SNOMED Dx Update Oct 2024     Weight gain 09/28/2017   Wellness examination 09/26/2015    Past Surgical History:  Procedure Laterality Date   COLONOSCOPY     ELECTROCARDIOGRAM  05/28/2006   POLYPECTOMY     Stress Cardiolite  08/22/2003   TYMPANOPLASTY  03/1996   Right    Social History[1]  Family History  Problem Relation Age of Onset   Stomach cancer Mother    Heart disease Mother    Gastric cancer Father        died age 38   Stroke Father    Colon cancer Sister 64   Other Brother        prostate issues, x 2 brothers   Rectal cancer Neg Hx    Crohn's disease Neg Hx    Esophageal cancer Neg Hx     Allergies[2]  Medication list has been reviewed and updated.  Medications Ordered Prior to Encounter[3]  Review of Systems:  As per HPI- otherwise negative.   Physical Examination: There were no vitals filed for this visit. There were no vitals filed for this visit. There is no height or weight on file to calculate BMI. Ideal Body Weight:    GEN: no acute distress. HEENT: Atraumatic, Normocephalic.  Ears and Nose: No external deformity. CV: RRR, No M/G/R. No JVD. No thrill. No extra heart sounds. PULM: CTA B, no wheezes, crackles, rhonchi. No retractions. No resp. distress. No accessory muscle use. ABD: S, NT, ND, +BS. No rebound. No HSM. EXTR: No c/c/e PSYCH: Normally interactive. Conversant.    Assessment and Plan: No diagnosis found.  Assessment & Plan   Signed Harlene Schroeder,  MD    [1]  Social History Tobacco Use   Smoking status: Former    Types: Cigarettes    Passive exposure: Never   Smokeless tobacco: Never  Vaping Use   Vaping status: Never Used  Substance Use Topics   Alcohol use: Yes    Comment: social   Drug use: No  [2]  Allergies Allergen Reactions   Zocor [Simvastatin]     REACTION: Rash  [3]  Current Outpatient Medications on File Prior to Visit  Medication Sig Dispense Refill   ALPRAZolam  (XANAX ) 0.5 MG tablet TAKE 1 TABLET(0.5 MG) BY MOUTH THREE TIMES DAILY AS NEEDED FOR ANXIETY 90 tablet 0   atorvastatin  (LIPITOR)  20 MG tablet TAKE 1 TABLET(20 MG) BY MOUTH DAILY 90 tablet 1   blood glucose meter kit and supplies KIT Dispense based on patient and insurance preference. Use once daily.  Dx E11.9 1 each 2   Blood Glucose Monitoring Suppl DEVI 1 each by Does not apply route in the morning, at noon, and at bedtime. May substitute to any manufacturer covered by patient's insurance. 1 each 0   CLOMID 50 MG tablet Take 25 mg by mouth daily.     diclofenac  (VOLTAREN ) 75 MG EC tablet TAKE 1 TABLET BY MOUTH TWICE DAILY AS NEEDED FOR PAIN 30 tablet 2   dutasteride  (AVODART ) 0.5 MG capsule Take 1 capsule (0.5 mg total) by mouth daily. 90 capsule 1   gabapentin  (NEURONTIN ) 100 MG capsule TAKE 1 CAPSULE(100 MG) BY MOUTH THREE TIMES DAILY 270 capsule 0   Glucose Blood (BLOOD GLUCOSE TEST STRIPS) STRP Use the check blood sugar twice daily as needed. Pt prefers One Touch brand. May substitute to any manufacturer covered by patient's insurance. 100 strip 3   Lancets (ONETOUCH DELICA PLUS LANCET30G) MISC Check blood sugars once daily 100 each 3   metFORMIN  (GLUCOPHAGE ) 500 MG tablet Take 1 tablet (500 mg total) by mouth 2 (two) times daily with a meal. 180 tablet 0   Multiple Vitamins-Minerals (MULTIVITAMIN MEN 50+ PO) Take 1 tablet by mouth daily.     omeprazole  (PRILOSEC) 20 MG capsule TAKE ONE CAPSULE BY MOUTH AS NEEDED 90 capsule 1   silodosin (RAPAFLO)  8 MG CAPS capsule Take 8 mg by mouth daily.     venlafaxine  XR (EFFEXOR -XR) 150 MG 24 hr capsule Take 1 capsule (150 mg total) by mouth daily with breakfast. 90 capsule 3   zolpidem  (AMBIEN ) 5 MG tablet Take 1.5 tablets (7.5 mg total) by mouth at bedtime as needed for sleep. 45 tablet 1   No current facility-administered medications on file prior to visit.   "

## 2024-05-10 ENCOUNTER — Encounter: Payer: Self-pay | Admitting: Family Medicine

## 2024-05-10 ENCOUNTER — Other Ambulatory Visit: Payer: Self-pay | Admitting: Family Medicine

## 2024-05-10 ENCOUNTER — Ambulatory Visit: Admitting: Family Medicine

## 2024-05-10 VITALS — BP 144/78 | HR 84 | Ht 70.0 in | Wt 181.6 lb

## 2024-05-10 DIAGNOSIS — G4700 Insomnia, unspecified: Secondary | ICD-10-CM | POA: Diagnosis not present

## 2024-05-10 DIAGNOSIS — G629 Polyneuropathy, unspecified: Secondary | ICD-10-CM

## 2024-05-10 DIAGNOSIS — Z125 Encounter for screening for malignant neoplasm of prostate: Secondary | ICD-10-CM

## 2024-05-10 DIAGNOSIS — K219 Gastro-esophageal reflux disease without esophagitis: Secondary | ICD-10-CM

## 2024-05-10 DIAGNOSIS — E1142 Type 2 diabetes mellitus with diabetic polyneuropathy: Secondary | ICD-10-CM

## 2024-05-10 DIAGNOSIS — E7800 Pure hypercholesterolemia, unspecified: Secondary | ICD-10-CM

## 2024-05-10 DIAGNOSIS — F4321 Adjustment disorder with depressed mood: Secondary | ICD-10-CM

## 2024-05-10 MED ORDER — TRAZODONE HCL 50 MG PO TABS: 25.0000 mg | ORAL_TABLET | Freq: Every evening | ORAL | 3 refills | Status: AC | PRN

## 2024-05-10 MED ORDER — OMEPRAZOLE 20 MG PO CPDR: 20.0000 mg | DELAYED_RELEASE_CAPSULE | Freq: Every day | ORAL | 1 refills | Status: AC

## 2024-05-10 NOTE — Patient Instructions (Addendum)
 Let's add trazodone to your Ambien  for sleep- start with 5 Ambien  and 25 trazodone; can go up from there gradually as needed I will be in touch with your labs Please use the omeprazole  for a month; let me know if this does not resolve your throat symptoms   Best of luck with your upcoming move!  We will miss you guys

## 2024-05-11 ENCOUNTER — Encounter: Payer: Self-pay | Admitting: Family Medicine

## 2024-05-11 LAB — BASIC METABOLIC PANEL WITH GFR
BUN: 16 mg/dL (ref 7–25)
CO2: 29 mmol/L (ref 20–32)
Calcium: 9.4 mg/dL (ref 8.6–10.3)
Chloride: 103 mmol/L (ref 98–110)
Creat: 0.83 mg/dL (ref 0.70–1.35)
Glucose, Bld: 153 mg/dL — ABNORMAL HIGH (ref 65–99)
Potassium: 4.3 mmol/L (ref 3.5–5.3)
Sodium: 139 mmol/L (ref 135–146)
eGFR: 96 mL/min/1.73m2

## 2024-05-11 LAB — VITAMIN B12: Vitamin B-12: 467 pg/mL (ref 200–1100)

## 2024-05-11 LAB — FERRITIN: Ferritin: 54 ng/mL (ref 24–380)

## 2024-05-11 LAB — HEMOGLOBIN A1C
Hgb A1c MFr Bld: 6 % — ABNORMAL HIGH
Mean Plasma Glucose: 126 mg/dL
eAG (mmol/L): 7 mmol/L

## 2024-05-11 LAB — PSA: PSA: 0.5 ng/mL

## 2024-05-18 ENCOUNTER — Other Ambulatory Visit: Payer: Self-pay | Admitting: Urology

## 2024-05-18 DIAGNOSIS — N402 Nodular prostate without lower urinary tract symptoms: Secondary | ICD-10-CM

## 2024-05-19 ENCOUNTER — Encounter: Payer: Self-pay | Admitting: Urology

## 2024-05-30 ENCOUNTER — Ambulatory Visit
Admission: RE | Admit: 2024-05-30 | Discharge: 2024-05-30 | Disposition: A | Source: Ambulatory Visit | Attending: Urology

## 2024-05-30 DIAGNOSIS — N402 Nodular prostate without lower urinary tract symptoms: Secondary | ICD-10-CM

## 2024-05-30 MED ORDER — GADOPICLENOL 0.5 MMOL/ML IV SOLN
10.0000 mL | Freq: Once | INTRAVENOUS | Status: AC | PRN
  Administered 2024-05-30: 8 mL via INTRAVENOUS

## 2024-06-01 ENCOUNTER — Ambulatory Visit: Admitting: Gastroenterology

## 2024-06-01 ENCOUNTER — Encounter: Payer: Self-pay | Admitting: Gastroenterology

## 2024-06-01 VITALS — BP 110/66 | HR 91 | Ht 70.0 in | Wt 182.2 lb

## 2024-06-01 DIAGNOSIS — K648 Other hemorrhoids: Secondary | ICD-10-CM

## 2024-06-01 DIAGNOSIS — R194 Change in bowel habit: Secondary | ICD-10-CM

## 2024-06-01 NOTE — Progress Notes (Signed)
 "     [facility] Progress Note  Chief Complaint: Anorectal discomfort and fecal incontinence  Subjective  Prior history  Family history of colorectal cancer in sister Diminutive tubular adenoma February 2018 2 diminutive tubular adenomas August 2023 - 5-year recall recommended 2 sessions of hemorrhoidal banding November 2023 (RP,/RA)   History of Present Illness  Luis Peterson is well-known to me from previous visits.  His late wife Luis Peterson was also a patient of mine  He has been struggling with urinary retention from a prostate issue and under the care of [facility].  This is apparently due to prostatic hypertrophy without malignancy and he has not received radiation or other such treatments. Causes him to strain and push the passes urine.  And with that over the last several months he has noticed a recurrence of what he perceives to be hemorrhoids.  There is a feeling of protrusion from the anal area and sometimes spots of blood on the paper.  Additionally, he may have leakage of stool if it is pasty.  Sometimes he has the urgency for BM and feels like things will not pass. Amiere had an MRI of the pelvis/prostate yesterday and has an upcoming appoint with the urologist to discuss that.  He believes they may be considering some type of IR procedure for the condition.  ROS: Cardiovascular:  no chest pain Respiratory: no dyspnea  The patient's Past Medical, Family and Social History were reviewed and are on file in the EMR. Past Medical History:  Diagnosis Date   Anxiety state 01/03/2007   Qualifier: Diagnosis of  By: Kassie MD, Alyce A    Arthralgia 10/30/2009   Qualifier: Diagnosis of  By: Kassie MD, Sean A    BEN LOC HYPERPLASIA PROS W/UR OBST & OTH LUTS 06/19/2008   Dr. Liliana   Bilateral shoulder pain 08/13/2016   CAD (coronary artery disease) 06/06/2020   Chest pain of uncertain etiology 03/01/2023   Degenerative joint disease of cervical spine    Depression    Diabetes  mellitus due to underlying condition with unspecified complications (HCC) 03/01/2023   DM (diabetes mellitus), type 2 (HCC) 10/20/2018   DOE (dyspnea on exertion) 10/29/2017   DYSPNEA 09/01/2007   Qualifier: Diagnosis of  By: Kassie MD, Sean A    FOOT PAIN 10/30/2009   Qualifier: Diagnosis of  By: Kassie MD, Sean A    GERD 06/19/2008   Qualifier: Diagnosis of  By: Kassie MD, Sean A    Hearing loss 06/19/2008   Qualifier: Diagnosis of   By: Kassie MD, Alyce LABOR     IMO SNOMED Dx Update Oct 2024     HYPERCHOLESTEROLEMIA 09/01/2007   Qualifier: Diagnosis of  By: Kassie MD, Sean A    Hyperglycemia 09/01/2007   Qualifier: Diagnosis of  By: Kassie MD, Sean A    INSOMNIA 06/19/2008   Qualifier: Diagnosis of  By: Kassie MD, Sean A    Low back pain 08/31/2013   Mixed dyslipidemia 03/01/2023   NASH (nonalcoholic steatohepatitis) 09/28/2017   Polyneuropathy associated with underlying disease 09/01/2023   Positive H. 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   Qualifier: Diagnosis of  By: Kassie MD, Sean A    Rectal bleeding 02/03/2016   Screening for prostate cancer 09/03/2014   Spondylosis 06/19/2008   Qualifier: Diagnosis of   By: Kassie MD, Alyce LABOR     IMO SNOMED Dx Update Oct 2024     Weight gain 09/28/2017   Wellness examination 09/26/2015  Past Surgical History:  Procedure Laterality Date   COLONOSCOPY     ELECTROCARDIOGRAM  05/28/2006   POLYPECTOMY     Stress Cardiolite  08/22/2003   TYMPANOPLASTY  03/1996   Right     Objective:  Med list reviewed Current Medications[1]   Vital signs in last 24 hrs: Vitals:   06/01/24 0922  BP: 110/66  Pulse: 91   Wt Readings from Last 3 Encounters:  06/01/24 182 lb 4 oz (82.7 kg)  05/10/24 181 lb 9.6 oz (82.4 kg)  01/05/24 165 lb 1.9 oz (74.9 kg)    Physical Exam   Cardiac: Regular without appreciable murmur,  no peripheral edema Pulm: clear to auscultation bilaterally, normal RR and effort noted Abdomen: soft,  no tenderness, with active bowel sounds. No guarding or palpable hepatosplenomegaly. Perianal area normal DRE normal.  Normal resting and voluntary sphincter tone, normal puborectalis contraction, normal rectal descent with bearing down Anoscopy shows swollen right sided internal hemorrhoidal plexus, primarily RP column extending from about 9-12 o'clock   Labs:   ___________________________________________ Radiologic studies:   ____________________________________________ Other:   _____________________________________________   Encounter Diagnoses  Name Primary?   Change in bowel habits Yes   Prolapsed hemorrhoids    Recurrence of symptomatic hemorrhoids, likely precipitated by frequent straining to pass urine.  I think that is also giving him the intermittent sensation of need for BM even when not much passes, also likely leading to what sounds like some prolapse and fecal smearing. Assessment & Plan  Recommended daily fiber supplement to give the stool better bulk/form in there for more complete evacuation.  Hopefully with improvement in his prostate issues he would no longer have to strain.  If does not improve with resolution of his prostate issue and fiber supplement, he will contact me to set up hemorrhoid banding.  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    Victory LITTIE Brand III     [1]  Current Outpatient Medications:    ALPRAZolam  (XANAX ) 0.5 MG tablet, TAKE 1 TABLET(0.5 MG) BY MOUTH THREE TIMES DAILY AS NEEDED FOR ANXIETY, Disp: 90 tablet, Rfl: 0   atorvastatin  (LIPITOR) 20 MG tablet, TAKE 1 TABLET(20 MG) BY MOUTH DAILY, Disp: 90 tablet, Rfl: 1   blood glucose meter kit and supplies KIT, Dispense based on patient and insurance preference. Use once daily.  Dx E11.9, Disp: 1 each, Rfl: 2   Blood Glucose Monitoring Suppl DEVI, 1 each by Does not apply route in the morning, at noon, and at bedtime. May  substitute to any manufacturer covered by patient's insurance., Disp: 1 each, Rfl: 0   CLOMID 50 MG tablet, Take 25 mg by mouth daily., Disp: , Rfl:    diclofenac  (VOLTAREN ) 75 MG EC tablet, TAKE 1 TABLET BY MOUTH TWICE DAILY AS NEEDED FOR PAIN, Disp: 30 tablet, Rfl: 2   dutasteride  (AVODART ) 0.5 MG capsule, Take 1 capsule (0.5 mg total) by mouth daily., Disp: 90 capsule, Rfl: 1   gabapentin  (NEURONTIN ) 100 MG capsule, TAKE 1 CAPSULE(100 MG) BY MOUTH THREE TIMES DAILY, Disp: 270 capsule, Rfl: 0   GEMTESA 75 MG TABS, Take 1 tablet by mouth daily., Disp: , Rfl:    Glucose Blood (BLOOD GLUCOSE TEST STRIPS) STRP, Use the check blood sugar twice daily as needed. Pt prefers One Touch brand. May substitute to any manufacturer covered by patient's insurance., Disp: 100 strip, Rfl: 3   Lancets (ONETOUCH DELICA PLUS LANCET30G) MISC, Check blood sugars  once daily, Disp: 100 each, Rfl: 3   metFORMIN  (GLUCOPHAGE ) 500 MG tablet, Take 1 tablet (500 mg total) by mouth 2 (two) times daily with a meal., Disp: 180 tablet, Rfl: 0   Multiple Vitamins-Minerals (MULTIVITAMIN MEN 50+ PO), Take 1 tablet by mouth daily., Disp: , Rfl:    omeprazole  (PRILOSEC) 20 MG capsule, Take 1 capsule (20 mg total) by mouth daily., Disp: 30 capsule, Rfl: 1   silodosin (RAPAFLO) 8 MG CAPS capsule, Take 8 mg by mouth daily., Disp: , Rfl:    tadalafil (CIALIS) 5 MG tablet, Take 5 mg by mouth daily., Disp: , Rfl:    traZODone  (DESYREL ) 50 MG tablet, Take 0.5-1 tablets (25-50 mg total) by mouth at bedtime as needed for sleep., Disp: 30 tablet, Rfl: 3   venlafaxine  XR (EFFEXOR -XR) 150 MG 24 hr capsule, Take 1 capsule (150 mg total) by mouth daily with breakfast., Disp: 90 capsule, Rfl: 3   zolpidem  (AMBIEN ) 5 MG tablet, Take 1.5 tablets (7.5 mg total) by mouth at bedtime as needed for sleep., Disp: 45 tablet, Rfl: 1  "

## 2024-06-01 NOTE — Patient Instructions (Addendum)
 _______________________________________________________  If your blood pressure at your visit was 140/90 or greater, please contact your primary care physician to follow up on this.  _______________________________________________________  If you are age 68 or older, your body mass index should be between 23-30. Your Body mass index is 26.15 kg/m. If this is out of the aforementioned range listed, please consider follow up with your Primary Care Provider.  If you are age 17 or younger, your body mass index should be between 19-25. Your Body mass index is 26.15 kg/m. If this is out of the aformentioned range listed, please consider follow up with your Primary Care Provider.   ________________________________________________________  The [facility] providers would like to encourage you to use MYCHART to communicate with providers for non-urgent requests or questions.  Due to long hold times on the telephone, sending your provider a message by [facility] may be a faster and more efficient way to get a response.  Please allow 48 business hours for a response.  Please remember that this is for non-urgent requests.  _______________________________________________________  [facility] is using a team-based approach to care.  Your team is made up of your doctor and two to three APPS. Our APPS (Nurse Practitioners and Physician Assistants) work with your physician to ensure care continuity for you. They are fully qualified to address your health concerns and develop a treatment plan. They communicate directly with your gastroenterologist to care for you. Seeing the Advanced Practice Practitioners on your physician's team can help you by facilitating care more promptly, often allowing for earlier appointments, access to diagnostic testing, procedures, and other specialty referrals.    Thank you for trusting me with your gastrointestinal care!    Dr. Victory Legrand DOUGLAS [facility]

## 2024-06-07 ENCOUNTER — Other Ambulatory Visit: Payer: Self-pay | Admitting: Family Medicine

## 2024-06-07 DIAGNOSIS — G4700 Insomnia, unspecified: Secondary | ICD-10-CM

## 2024-06-10 ENCOUNTER — Other Ambulatory Visit: Payer: Self-pay | Admitting: Family Medicine

## 2024-06-10 DIAGNOSIS — E1142 Type 2 diabetes mellitus with diabetic polyneuropathy: Secondary | ICD-10-CM

## 2024-08-02 ENCOUNTER — Ambulatory Visit: Admitting: Neurology
